# Patient Record
Sex: Male | Born: 1935 | Race: White | Hispanic: No | Marital: Married | Smoking: Never smoker
Health system: Southern US, Community
[De-identification: ages and names within clinical notes are randomized; demographics above are authoritative.]

## PROBLEM LIST (undated history)

## (undated) DIAGNOSIS — G8929 Other chronic pain: Secondary | ICD-10-CM

## (undated) DIAGNOSIS — E039 Hypothyroidism, unspecified: Secondary | ICD-10-CM

## (undated) DIAGNOSIS — R0600 Dyspnea, unspecified: Secondary | ICD-10-CM

## (undated) DIAGNOSIS — E785 Hyperlipidemia, unspecified: Secondary | ICD-10-CM

## (undated) DIAGNOSIS — R296 Repeated falls: Secondary | ICD-10-CM

## (undated) DIAGNOSIS — M545 Low back pain, unspecified: Secondary | ICD-10-CM

## (undated) DIAGNOSIS — N182 Chronic kidney disease, stage 2 (mild): Secondary | ICD-10-CM

## (undated) DIAGNOSIS — N400 Enlarged prostate without lower urinary tract symptoms: Secondary | ICD-10-CM

## (undated) DIAGNOSIS — I739 Peripheral vascular disease, unspecified: Secondary | ICD-10-CM

## (undated) DIAGNOSIS — M79674 Pain in right toe(s): Secondary | ICD-10-CM

## (undated) DIAGNOSIS — I10 Essential (primary) hypertension: Secondary | ICD-10-CM

## (undated) DIAGNOSIS — I779 Disorder of arteries and arterioles, unspecified: Secondary | ICD-10-CM

## (undated) DIAGNOSIS — I480 Paroxysmal atrial fibrillation: Secondary | ICD-10-CM

## (undated) DIAGNOSIS — C4492 Squamous cell carcinoma of skin, unspecified: Secondary | ICD-10-CM

## (undated) DIAGNOSIS — I350 Nonrheumatic aortic (valve) stenosis: Secondary | ICD-10-CM

## (undated) DIAGNOSIS — I5032 Chronic diastolic (congestive) heart failure: Secondary | ICD-10-CM

## (undated) DIAGNOSIS — G5790 Unspecified mononeuropathy of unspecified lower limb: Secondary | ICD-10-CM

## (undated) DIAGNOSIS — G629 Polyneuropathy, unspecified: Secondary | ICD-10-CM

## (undated) DIAGNOSIS — W19XXXA Unspecified fall, initial encounter: Secondary | ICD-10-CM

## (undated) HISTORY — PX: LAMINECTOMY AND MICRODISCECTOMY LUMBAR SPINE: SHX1913

## (undated) HISTORY — DX: Benign prostatic hyperplasia without lower urinary tract symptoms: N40.0

## (undated) HISTORY — DX: Hyperlipidemia, unspecified: E78.5

## (undated) HISTORY — PX: JOINT REPLACEMENT: SHX530

## (undated) HISTORY — DX: Squamous cell carcinoma of skin, unspecified: C44.92

## (undated) HISTORY — DX: Repeated falls: R29.6

## (undated) HISTORY — DX: Low back pain, unspecified: M54.50

## (undated) HISTORY — DX: Nonrheumatic aortic (valve) stenosis: I35.0

## (undated) HISTORY — DX: Unspecified fall, initial encounter: W19.XXXA

## (undated) HISTORY — PX: CAROTID ENDARTERECTOMY: SUR193

## (undated) HISTORY — DX: Peripheral vascular disease, unspecified: I73.9

## (undated) HISTORY — DX: Disorder of arteries and arterioles, unspecified: I77.9

## (undated) HISTORY — DX: Other chronic pain: G89.29

## (undated) HISTORY — DX: Paroxysmal atrial fibrillation: I48.0

## (undated) HISTORY — DX: Unspecified mononeuropathy of unspecified lower limb: G57.90

## (undated) HISTORY — DX: Low back pain: M54.5

## (undated) HISTORY — DX: Chronic diastolic (congestive) heart failure: I50.32

## (undated) HISTORY — DX: Pain in right toe(s): M79.674

## (undated) HISTORY — DX: Essential (primary) hypertension: I10

## (undated) HISTORY — DX: Chronic kidney disease, stage 2 (mild): N18.2

## (undated) HISTORY — DX: Hypothyroidism, unspecified: E03.9

## (undated) HISTORY — PX: LYMPH NODE DISSECTION: SHX5087

---

## 2002-12-27 ENCOUNTER — Encounter: Payer: Self-pay | Admitting: Neurosurgery

## 2002-12-29 ENCOUNTER — Ambulatory Visit (HOSPITAL_COMMUNITY): Admission: RE | Admit: 2002-12-29 | Discharge: 2002-12-30 | Payer: Self-pay | Admitting: Neurosurgery

## 2002-12-29 ENCOUNTER — Encounter: Payer: Self-pay | Admitting: Neurosurgery

## 2003-11-03 ENCOUNTER — Other Ambulatory Visit: Payer: Self-pay

## 2004-03-26 ENCOUNTER — Ambulatory Visit: Payer: Self-pay

## 2004-03-26 ENCOUNTER — Other Ambulatory Visit: Payer: Self-pay

## 2004-04-01 ENCOUNTER — Ambulatory Visit: Payer: Self-pay | Admitting: Specialist

## 2004-05-01 ENCOUNTER — Emergency Department: Payer: Self-pay | Admitting: Emergency Medicine

## 2004-06-03 ENCOUNTER — Ambulatory Visit: Payer: Self-pay | Admitting: Ophthalmology

## 2004-06-18 ENCOUNTER — Ambulatory Visit: Payer: Self-pay | Admitting: Ophthalmology

## 2004-09-16 ENCOUNTER — Emergency Department: Payer: Self-pay | Admitting: Emergency Medicine

## 2005-03-08 ENCOUNTER — Emergency Department (HOSPITAL_COMMUNITY): Admission: EM | Admit: 2005-03-08 | Discharge: 2005-03-08 | Payer: Self-pay | Admitting: Emergency Medicine

## 2005-04-02 ENCOUNTER — Ambulatory Visit (HOSPITAL_COMMUNITY): Admission: RE | Admit: 2005-04-02 | Discharge: 2005-04-02 | Payer: Self-pay | Admitting: *Deleted

## 2005-04-08 ENCOUNTER — Ambulatory Visit (HOSPITAL_COMMUNITY): Admission: RE | Admit: 2005-04-08 | Discharge: 2005-04-08 | Payer: Self-pay | Admitting: Cardiovascular Disease

## 2005-04-08 HISTORY — PX: CARDIAC CATHETERIZATION: SHX172

## 2007-02-03 ENCOUNTER — Ambulatory Visit: Payer: Self-pay | Admitting: Unknown Physician Specialty

## 2007-02-09 ENCOUNTER — Inpatient Hospital Stay: Payer: Self-pay | Admitting: Unknown Physician Specialty

## 2007-02-09 HISTORY — PX: KNEE SURGERY: SHX244

## 2010-01-17 ENCOUNTER — Ambulatory Visit: Payer: Self-pay | Admitting: Neurosurgery

## 2010-08-14 ENCOUNTER — Ambulatory Visit: Payer: Self-pay | Admitting: General Surgery

## 2010-08-20 ENCOUNTER — Ambulatory Visit: Payer: Self-pay | Admitting: Oncology

## 2010-08-22 ENCOUNTER — Ambulatory Visit: Payer: Self-pay | Admitting: General Surgery

## 2010-08-30 ENCOUNTER — Ambulatory Visit: Payer: Self-pay | Admitting: General Surgery

## 2010-09-12 ENCOUNTER — Ambulatory Visit: Payer: Self-pay | Admitting: Oncology

## 2010-09-20 ENCOUNTER — Ambulatory Visit: Payer: Self-pay | Admitting: Oncology

## 2010-10-20 ENCOUNTER — Ambulatory Visit: Payer: Self-pay | Admitting: Oncology

## 2010-11-20 ENCOUNTER — Ambulatory Visit: Payer: Self-pay | Admitting: Oncology

## 2010-12-13 ENCOUNTER — Inpatient Hospital Stay: Payer: Self-pay | Admitting: Internal Medicine

## 2010-12-13 DIAGNOSIS — I4891 Unspecified atrial fibrillation: Secondary | ICD-10-CM

## 2010-12-21 ENCOUNTER — Ambulatory Visit: Payer: Self-pay | Admitting: Oncology

## 2010-12-26 ENCOUNTER — Encounter: Payer: Self-pay | Admitting: *Deleted

## 2010-12-27 ENCOUNTER — Encounter: Payer: Self-pay | Admitting: Cardiovascular Disease

## 2010-12-27 ENCOUNTER — Encounter: Payer: Self-pay | Admitting: *Deleted

## 2010-12-27 ENCOUNTER — Ambulatory Visit (INDEPENDENT_AMBULATORY_CARE_PROVIDER_SITE_OTHER): Payer: Medicare Other | Admitting: Cardiovascular Disease

## 2010-12-27 DIAGNOSIS — I519 Heart disease, unspecified: Secondary | ICD-10-CM

## 2010-12-27 DIAGNOSIS — C4490 Unspecified malignant neoplasm of skin, unspecified: Secondary | ICD-10-CM

## 2010-12-27 DIAGNOSIS — I10 Essential (primary) hypertension: Secondary | ICD-10-CM

## 2010-12-27 DIAGNOSIS — I5189 Other ill-defined heart diseases: Secondary | ICD-10-CM

## 2010-12-27 DIAGNOSIS — I4891 Unspecified atrial fibrillation: Secondary | ICD-10-CM

## 2010-12-27 MED ORDER — DILTIAZEM HCL ER COATED BEADS 240 MG PO CP24: 240.0000 mg | ORAL_CAPSULE | Freq: Every day | ORAL | Status: DC

## 2010-12-27 MED ORDER — LORAZEPAM 0.5 MG PO TABS: 0.5000 mg | ORAL_TABLET | Freq: Three times a day (TID) | ORAL | Status: AC

## 2010-12-27 NOTE — Patient Instructions (Addendum)
You are doing well. Please increase the diltiazem to 240 mg daily, increase the metoprolol to 25 mg BID Please call us if you have new issues that need to be addressed before your next appt.  We will call you for a follow up Appt. In 3 months

## 2010-12-28 DIAGNOSIS — C4490 Unspecified malignant neoplasm of skin, unspecified: Secondary | ICD-10-CM | POA: Insufficient documentation

## 2010-12-28 NOTE — Assessment & Plan Note (Signed)
He is maintaining normal sinus rhythm. His blood pressure is poorly controlled and likely the cause of his paroxysmal atrial fibrillation, as well as diastolic dysfunction. We will double his diltiazem to 240 mg daily, and increased the metoprolol tartrate  To 25 mg b.i.d.

## 2010-12-28 NOTE — Progress Notes (Signed)
Patient ID: Jason Wade, male    DOB: October 18, 1923, 77 y.o.   MRN: [id number]  HPI Comments: Jason Wade is a 77 year old gentleman with a history of squamous cell cancer, resection, recurrence with long course of XRT, now undergoing chemotherapy who presented to [facility] from the cancer center with shortness of breath, found to be in atrial fibrillation. He presents to establish care in the office.  At the cancer center, he was noted to have a heart rate of 140 beats per minute. He received Cardizem IV and converted to normal sinus rhythm. He reported having shortness of breath over 3 days prior to admission. Symptoms started the day of or day following chemotherapy. He reports having IV fluids with chemotherapy.  Since his discharge, he has felt well though has noticed his blood pressure is very elevated. He does have some shortness of breath. He also has underlying anxiety about his ongoing treatment.  Echocardiogram done in the hospital shows normal LV systolic function, diastolic dysfunction, mild LVH, normal right ventricular systolic pressures  EKG shows normal sinus rhythm with rate 81 beats per minute with ST and T wave abnormality in V4 through V6, leads aVL, T wave abnormality and aVF  Outpatient Encounter Prescriptions as of 12/27/2010  Medication Sig Dispense Refill  . acetaminophen (TYLENOL) 325 MG tablet Take 650 mg by mouth as needed.        Marland Kitchen aspirin 81 MG tablet Take 81 mg by mouth daily.        . cloNIDine (CATAPRES) 0.2 MG tablet Take 0.2 mg by mouth 2 (two) times daily.        Marland Kitchen levothyroxine (SYNTHROID, LEVOTHROID) 75 MCG tablet Take 75 mcg by mouth daily.        . methylPREDNIsolone (MEDROL DOSPACK) 4 MG tablet As directed.      . metoprolol tartrate (LOPRESSOR) 25 MG tablet Take 1/2 tablet by mouth BID       . prochlorperazine (COMPAZINE) 10 MG tablet Take 10 mg by mouth every 6 (six) hours as needed.        . simvastatin (ZOCOR) 20 MG tablet Take 20 mg by mouth at bedtime.         Marland Kitchen terazosin (HYTRIN) 2 MG capsule Take 2 mg by mouth at bedtime.        Marland Kitchen  diltiazem (CARDIZEM CD) 120 MG 24 hr capsule Take 1 tablet by mouth Daily.          Review of Systems  Constitutional: Negative.   HENT: Negative.   Eyes: Negative.   Respiratory: Positive for shortness of breath.   Cardiovascular: Negative.   Gastrointestinal: Negative.   Musculoskeletal: Negative.   Skin: Negative.   Neurological: Negative.   Hematological: Negative.   Psychiatric/Behavioral: The patient is nervous/anxious.   All other systems reviewed and are negative.    BP 172/70  Pulse 81  Ht 5\' 8"  (1.727 m)  Wt 184 lb (83.462 kg)  BMI 27.98 kg/m2   Physical Exam  Nursing note and vitals reviewed. Constitutional: He is oriented to person, place, and time. He appears well-developed and well-nourished.       Appears moderately anxious. Mild-to-moderate obesity.   HENT:  Head: Normocephalic.  Nose: Nose normal.  Mouth/Throat: Oropharynx is clear and moist.  Eyes: Conjunctivae are normal. Pupils are equal, round, and reactive to light.  Neck: Normal range of motion. Neck supple. No JVD present.  Cardiovascular: Normal rate, regular rhythm, S1 normal, S2 normal, normal heart sounds and  intact distal pulses.  Exam reveals no gallop and no friction rub.   No murmur heard. Pulmonary/Chest: Effort normal and breath sounds normal. No respiratory distress. He has no wheezes. He has no rales. He exhibits no tenderness.  Abdominal: Soft. Bowel sounds are normal. He exhibits no distension. There is no tenderness.  Musculoskeletal: Normal range of motion. He exhibits no edema and no tenderness.  Lymphadenopathy:    He has no cervical adenopathy.  Neurological: He is alert and oriented to person, place, and time. Coordination normal.  Skin: Skin is warm and dry. No rash noted. No erythema.  Psychiatric: He has a normal mood and affect. His behavior is normal. Judgment and thought content normal.            Assessment and Plan

## 2010-12-28 NOTE — Assessment & Plan Note (Signed)
Diastolic dysfunction noted on echocardiogram with normal LV systolic function appeared diastolic dysfunction likely contributing to atrial fibrillation. We will need to use IV fluids cautiously when giving chemotherapy to minimize paroxysmal atrial fibrillation  episodes.

## 2010-12-28 NOTE — Assessment & Plan Note (Signed)
Blood pressure is poorly controlled today. We have suggested he increase his metoprolol to 25 mg b.i.d. And diltiazem to 240 mg daily

## 2010-12-30 ENCOUNTER — Encounter: Payer: Self-pay | Admitting: Cardiovascular Disease

## 2011-01-20 ENCOUNTER — Ambulatory Visit: Payer: Self-pay | Admitting: Oncology

## 2011-02-05 ENCOUNTER — Ambulatory Visit: Payer: Self-pay | Admitting: Oncology

## 2011-02-10 ENCOUNTER — Ambulatory Visit: Payer: Self-pay | Admitting: Oncology

## 2011-02-13 ENCOUNTER — Telehealth: Payer: Self-pay | Admitting: Cardiovascular Disease

## 2011-02-13 NOTE — Telephone Encounter (Signed)
Pt calling concerning medication that the dr changed when he was in the hospital. Pt wants to go back on the meds he stopped. Please call

## 2011-02-14 NOTE — Telephone Encounter (Signed)
Attempted to call pt, no vm x1.

## 2011-02-20 ENCOUNTER — Ambulatory Visit: Payer: Self-pay | Admitting: Oncology

## 2011-03-24 ENCOUNTER — Ambulatory Visit: Payer: Self-pay | Admitting: Oncology

## 2011-03-28 ENCOUNTER — Encounter: Payer: Self-pay | Admitting: Cardiovascular Disease

## 2011-03-28 ENCOUNTER — Ambulatory Visit (INDEPENDENT_AMBULATORY_CARE_PROVIDER_SITE_OTHER): Payer: Medicare Other | Admitting: Cardiovascular Disease

## 2011-03-28 VITALS — BP 165/73 | HR 52 | Ht 68.0 in | Wt 186.0 lb

## 2011-03-28 DIAGNOSIS — I4891 Unspecified atrial fibrillation: Secondary | ICD-10-CM

## 2011-03-28 DIAGNOSIS — I519 Heart disease, unspecified: Secondary | ICD-10-CM

## 2011-03-28 DIAGNOSIS — I5189 Other ill-defined heart diseases: Secondary | ICD-10-CM

## 2011-03-28 DIAGNOSIS — I10 Essential (primary) hypertension: Secondary | ICD-10-CM

## 2011-03-28 NOTE — Progress Notes (Signed)
Patient ID: Jason Wade, male    DOB: 04-30-1923, 77 y.o.   MRN: [id number]  HPI Comments: Mr. Cisse is a 77 year old gentleman with a history of squamous cell cancer, resection, recurrence with long course of XRT, s/p chemotherapy who presented to [facility] from the cancer center with shortness of breath, found to be in atrial fibrillation. He presents 4 routine followup.  He denies any palpitations concerning for recurrence of his atrial fibrillation. He reports his blood pressure at home is typically in the 140-145 range systolic. He has no complaints though would like to stop some of his medications. He does report minimal edema in his right lower ankle. No chest pain, shortness of breath. On his last clinic visit, diltiazem CD was increased from 120 mg to 240 mg daily for hypertension.  Echocardiogram done in the hospital shows normal LV systolic function, diastolic dysfunction, mild LVH, normal right ventricular systolic pressures  EKG shows normal sinus rhythm with rate 53 beats per minute with ST and T wave abnormality in V4 through V6, leads aVL, T wave abnormality and aVF  Outpatient Encounter Prescriptions as of 03/28/2011  Medication Sig Dispense Refill  . acetaminophen (TYLENOL) 325 MG tablet Take 650 mg by mouth as needed.        Marland Kitchen aspirin 81 MG tablet Take 81 mg by mouth daily.        . cloNIDine (CATAPRES) 0.2 MG tablet Take 0.2 mg by mouth 2 (two) times daily.        Marland Kitchen diltiazem (CARDIZEM CD) 240 MG 24 hr capsule Take 1 capsule (240 mg total) by mouth daily.  60 capsule  6  . levothyroxine (SYNTHROID, LEVOTHROID) 75 MCG tablet Take 75 mcg by mouth daily.        . methylPREDNIsolone (MEDROL DOSPACK) 4 MG tablet As directed.      . metoprolol tartrate (LOPRESSOR) 25 MG tablet Take 1 tablet by mouth Daily.        . prochlorperazine (COMPAZINE) 10 MG tablet Take 10 mg by mouth every 6 (six) hours as needed.        . simvastatin (ZOCOR) 20 MG tablet Take 20 mg by mouth at bedtime.         Marland Kitchen terazosin (HYTRIN) 2 MG capsule Take 2 mg by mouth at bedtime.            Review of Systems  Constitutional: Negative.   HENT: Negative.   Eyes: Negative.   Cardiovascular: Negative.   Gastrointestinal: Negative.   Musculoskeletal: Negative.   Skin: Negative.   Neurological: Negative.   Hematological: Negative.   Psychiatric/Behavioral: The patient is nervous/anxious.   All other systems reviewed and are negative.    BP 165/73  Pulse 52  Ht 5\' 8"  (1.727 m)  Wt 186 lb (84.369 kg)  BMI 28.28 kg/m2 Repeat blood pressure was 180/90. He was somewhat agitated when discussing his blood pressure.  Physical Exam  Nursing note and vitals reviewed. Constitutional: He is oriented to person, place, and time. He appears well-developed and well-nourished.       Appears moderately anxious. Mild-to-moderate obesity.   HENT:  Head: Normocephalic.  Nose: Nose normal.  Mouth/Throat: Oropharynx is clear and moist.  Eyes: Conjunctivae are normal. Pupils are equal, round, and reactive to light.  Neck: Normal range of motion. Neck supple. No JVD present.  Cardiovascular: Normal rate, regular rhythm, S1 normal, S2 normal, normal heart sounds and intact distal pulses.  Exam reveals no gallop and no friction  rub.   No murmur heard. Pulmonary/Chest: Effort normal and breath sounds normal. No respiratory distress. He has no wheezes. He has no rales. He exhibits no tenderness.  Abdominal: Soft. Bowel sounds are normal. He exhibits no distension. There is no tenderness.  Musculoskeletal: Normal range of motion. He exhibits no edema and no tenderness.  Lymphadenopathy:    He has no cervical adenopathy.  Neurological: He is alert and oriented to person, place, and time. Coordination normal.  Skin: Skin is warm and dry. No rash noted. No erythema.  Psychiatric: He has a normal mood and affect. His behavior is normal. Judgment and thought content normal.           Assessment and Plan

## 2011-03-28 NOTE — Patient Instructions (Signed)
You are doing well. No medication changes were made.  Please call us if you have new issues that need to be addressed before your next appt.  The office will contact you for a follow up Appt. In 6 months  

## 2011-03-28 NOTE — Assessment & Plan Note (Signed)
No further recurrence as far as we know of his atrial fibrillation on calcium channel blocker and beta blocker. I suggested he stay on his current medication regimen

## 2011-03-28 NOTE — Assessment & Plan Note (Signed)
He has indicated he would like to come off some of his medications. I suggested he stay on his current regimen given that his blood pressure has been mildly elevated at home and was certainly elevated in our office. I think he was somewhat agitated at not being able to come off some of the medication and this may have caused his blood pressure to go up even higher. I've asked him to monitor his blood pressure and heart rate at home and copies of our office for further evaluation. If his blood pressure does look significantly improved at home, perhaps we could slowly decrease the doses of the medications.

## 2011-03-28 NOTE — Assessment & Plan Note (Signed)
No clinical signs of heart failure on today's visit. We'll continue current medications

## 2011-04-22 ENCOUNTER — Ambulatory Visit: Payer: Self-pay | Admitting: Oncology

## 2011-05-01 ENCOUNTER — Ambulatory Visit: Payer: Self-pay | Admitting: Oncology

## 2011-05-06 ENCOUNTER — Ambulatory Visit: Payer: Self-pay | Admitting: Oncology

## 2011-05-06 LAB — CBC CANCER CENTER
Basophil %: 2.1 %
Eosinophil %: 1 %
HGB: 12.6 g/dL — ABNORMAL LOW (ref 13.0–18.0)
Lymphocyte %: 10.3 %
MCH: 30.4 pg (ref 26.0–34.0)
Monocyte #: 0.7 x10 3/mm (ref 0.0–0.7)
Neutrophil %: 76.9 %
Platelet: 200 x10 3/mm (ref 150–440)
RBC: 4.13 10*6/uL — ABNORMAL LOW (ref 4.40–5.90)
RDW: 16.5 % — ABNORMAL HIGH (ref 11.5–14.5)
WBC: 7 x10 3/mm (ref 3.8–10.6)

## 2011-05-07 ENCOUNTER — Telehealth: Payer: Self-pay

## 2011-05-07 DIAGNOSIS — I10 Essential (primary) hypertension: Secondary | ICD-10-CM

## 2011-05-07 NOTE — Telephone Encounter (Signed)
Would like a refill sent to [facility] @ [phone] ID #  [id number] for metoprolol tart 25 mg take one tablet bid also diltiazem cd 240 mg take one tablet daily for 90 day supply with 3 refills. Spoke with Synetta Fail (pharmacist) at [facility] for the refill for above medications.

## 2011-05-14 ENCOUNTER — Encounter: Payer: Self-pay | Admitting: Oncology

## 2011-05-23 ENCOUNTER — Encounter: Payer: Self-pay | Admitting: Oncology

## 2011-05-23 ENCOUNTER — Ambulatory Visit: Payer: Self-pay | Admitting: Oncology

## 2011-06-17 ENCOUNTER — Ambulatory Visit: Payer: Self-pay | Admitting: Oncology

## 2011-06-20 ENCOUNTER — Ambulatory Visit: Payer: Self-pay | Admitting: Oncology

## 2011-06-20 ENCOUNTER — Encounter: Payer: Self-pay | Admitting: Oncology

## 2011-07-21 ENCOUNTER — Encounter: Payer: Self-pay | Admitting: Oncology

## 2011-07-21 ENCOUNTER — Ambulatory Visit: Payer: Self-pay | Admitting: Oncology

## 2011-08-20 ENCOUNTER — Ambulatory Visit: Payer: Self-pay | Admitting: Oncology

## 2011-09-05 LAB — CREATININE, SERUM: Creatinine: 0.97 mg/dL (ref 0.60–1.30)

## 2011-09-20 ENCOUNTER — Ambulatory Visit: Payer: Self-pay | Admitting: Oncology

## 2011-09-29 ENCOUNTER — Encounter: Payer: Self-pay | Admitting: Cardiovascular Disease

## 2011-09-29 ENCOUNTER — Ambulatory Visit (INDEPENDENT_AMBULATORY_CARE_PROVIDER_SITE_OTHER): Payer: Medicare Other | Admitting: Cardiovascular Disease

## 2011-09-29 VITALS — BP 140/60 | HR 68 | Ht 68.0 in | Wt 187.0 lb

## 2011-09-29 DIAGNOSIS — I4891 Unspecified atrial fibrillation: Secondary | ICD-10-CM

## 2011-09-29 DIAGNOSIS — I10 Essential (primary) hypertension: Secondary | ICD-10-CM

## 2011-09-29 DIAGNOSIS — R609 Edema, unspecified: Secondary | ICD-10-CM

## 2011-09-29 MED ORDER — HYDROCHLOROTHIAZIDE 25 MG PO TABS: 25.0000 mg | ORAL_TABLET | Freq: Every day | ORAL | Status: DC

## 2011-09-29 NOTE — Assessment & Plan Note (Addendum)
He does have significant edema on today's visit. We will start HCTZ 25 mg daily. If there's no improvement in the next several weeks, we will likely need to decrease his diltiazem as this could be a side effect of the calcium channel blocker.

## 2011-09-29 NOTE — Progress Notes (Signed)
Patient ID: Jason Wade, male    DOB: [birth year], 78 y.o.   MRN: [id number]  HPI Comments: Jason Wade is a 78 year old gentleman with a history of squamous cell cancer, resection, recurrence with long course of XRT, s/p chemotherapy who presented to [facility] from the cancer center with shortness of breath, found to be in atrial fibrillation. He presents 4 routine followup.  He reports that he has had worsening lower extremity edema bilaterally. He would like to restart HCTZ. He does have lymphedema in his right arm and has compression therapy on the arm by regular basis. Previous surgery with lymph node resection on the right. He does report occasional episodes of palpitations lasting for several seconds at a time. He states having a previous carotid ultrasound at [facility].  echocardiogram done in the hospital shows normal LV systolic function, diastolic dysfunction, mild LVH, normal right ventricular systolic pressures  EKG shows normal sinus rhythm with rate 59 beats per minute with ST and T wave abnormality in V4 through V6, leads aVL, T wave abnormality and aVF  Outpatient Encounter Prescriptions as of 09/29/2011  Medication Sig Dispense Refill  . aspirin 81 MG tablet Take 81 mg by mouth daily.        . cloNIDine (CATAPRES) 0.2 MG tablet Take 0.2 mg by mouth 2 (two) times daily.        Marland Kitchen diltiazem (CARDIZEM CD) 240 MG 24 hr capsule Take 1 capsule (240 mg total) by mouth daily.  60 capsule  6  . levothyroxine (SYNTHROID, LEVOTHROID) 75 MCG tablet Take 75 mcg by mouth daily.        . metoprolol tartrate (LOPRESSOR) 25 MG tablet Take 1 tablet by mouth 2 (two) times daily.       . simvastatin (ZOCOR) 20 MG tablet Take 20 mg by mouth at bedtime.        Marland Kitchen terazosin (HYTRIN) 2 MG capsule Take 2 mg by mouth at bedtime.         Review of Systems  Constitutional: Negative.   HENT: Negative.   Eyes: Negative.   Respiratory: Negative.   Cardiovascular: Positive for leg  swelling.  Gastrointestinal: Negative.   Musculoskeletal: Negative.   Skin: Negative.   Neurological: Negative.   Hematological: Negative.   All other systems reviewed and are negative.    BP 140/60  Pulse 68  Ht 5\' 8"  (1.727 m)  Wt 187 lb (84.823 kg)  BMI 28.43 kg/m2  Physical Exam  Nursing note and vitals reviewed. Constitutional: He is oriented to person, place, and time. He appears well-developed and well-nourished.       Appears moderately anxious. Mild-to-moderate obesity.   HENT:  Head: Normocephalic.  Nose: Nose normal.  Mouth/Throat: Oropharynx is clear and moist.  Eyes: Conjunctivae are normal. Pupils are equal, round, and reactive to light.  Neck: Normal range of motion. Neck supple. No JVD present.  Cardiovascular: Normal rate, regular rhythm, S1 normal, S2 normal, normal heart sounds and intact distal pulses.  Exam reveals no gallop and no friction rub.   No murmur heard.      1-2+ edema bilaterally to below the knees  Pulmonary/Chest: Effort normal and breath sounds normal. No respiratory distress. He has no wheezes. He has no rales. He exhibits no tenderness.  Abdominal: Soft. Bowel sounds are normal. He exhibits no distension. There is no tenderness.  Musculoskeletal: Normal range of motion. He exhibits edema. He exhibits no tenderness.  Lymphadenopathy:    He  has no cervical adenopathy.  Neurological: He is alert and oriented to person, place, and time. Coordination normal.  Skin: Skin is warm and dry. No rash noted. No erythema.  Psychiatric: He has a normal mood and affect. His behavior is normal. Judgment and thought content normal.           Assessment and Plan

## 2011-09-29 NOTE — Assessment & Plan Note (Signed)
Blood pressure borderline elevated. We will add HCTZ daily

## 2011-09-29 NOTE — Patient Instructions (Signed)
You are doing well. Try the HCTZ for edema, one a day If there is no improvement with the edema in a few weeks, call the office (could be the diltiazem side effects)  Watch your fluid intake   Please call us if you have new issues that need to be addressed before your next appt.  Your physician wants you to follow-up in: 6 months.  You will receive a reminder letter in the mail two months in advance. If you don't receive a letter, please call our office to schedule the follow-up appointment.

## 2011-09-29 NOTE — Assessment & Plan Note (Signed)
Maintaining sinus rhythm on today's visit.Jason Wade

## 2011-09-30 ENCOUNTER — Encounter: Payer: Self-pay | Admitting: Cardiovascular Disease

## 2011-10-20 ENCOUNTER — Ambulatory Visit: Payer: Self-pay | Admitting: Oncology

## 2011-11-20 ENCOUNTER — Ambulatory Visit: Payer: Self-pay | Admitting: Oncology

## 2011-12-21 ENCOUNTER — Ambulatory Visit: Payer: Self-pay | Admitting: Oncology

## 2012-01-23 ENCOUNTER — Ambulatory Visit: Payer: Self-pay | Admitting: Oncology

## 2012-02-20 ENCOUNTER — Ambulatory Visit: Payer: Self-pay | Admitting: Oncology

## 2012-03-02 ENCOUNTER — Ambulatory Visit: Payer: Self-pay | Admitting: Oncology

## 2012-03-02 LAB — CREATININE, SERUM
Creatinine: 1.2 mg/dL (ref 0.60–1.30)
EGFR (African American): >60
EGFR (Non-African Amer.): 55 — ABNORMAL LOW

## 2012-03-15 ENCOUNTER — Emergency Department: Payer: Self-pay | Admitting: Emergency Medicine

## 2012-03-21 ENCOUNTER — Ambulatory Visit: Payer: Self-pay | Admitting: Oncology

## 2012-03-29 ENCOUNTER — Ambulatory Visit (INDEPENDENT_AMBULATORY_CARE_PROVIDER_SITE_OTHER): Payer: Medicare Other | Admitting: Cardiovascular Disease

## 2012-03-29 ENCOUNTER — Encounter: Payer: Self-pay | Admitting: Cardiovascular Disease

## 2012-03-29 VITALS — BP 140/58 | HR 57 | Ht 68.0 in | Wt 173.0 lb

## 2012-03-29 DIAGNOSIS — I10 Essential (primary) hypertension: Secondary | ICD-10-CM

## 2012-03-29 DIAGNOSIS — R609 Edema, unspecified: Secondary | ICD-10-CM

## 2012-03-29 DIAGNOSIS — I4891 Unspecified atrial fibrillation: Secondary | ICD-10-CM

## 2012-03-29 MED ORDER — LORAZEPAM 0.5 MG PO TABS: 0.5000 mg | ORAL_TABLET | Freq: Three times a day (TID) | ORAL | Status: DC | PRN

## 2012-03-29 NOTE — Patient Instructions (Addendum)
You are doing well. No medication changes were made.  Please call us if you have new issues that need to be addressed before your next appt.  Your physician wants you to follow-up in: 6 months.  You will receive a reminder letter in the mail two months in advance. If you don't receive a letter, please call our office to schedule the follow-up appointment.   

## 2012-03-29 NOTE — Assessment & Plan Note (Signed)
Edema has resolved with low-dose HCTZ. We have suggested he continue one half pill daily. He can hold the pill for any dizziness.

## 2012-03-29 NOTE — Progress Notes (Signed)
Patient ID: Jason Wade, male    DOB: August 03, 1923, 78 y.o.   MRN: [id number]  HPI Comments: Jason Wade is a 78 year old gentleman with a history of squamous cell cancer, resection, recurrence with long course of XRT, s/p chemotherapy who presented to [facility] from the cancer center with shortness of breath, found to be in atrial fibrillation. He presents for routine followup.  On his last clinic visit, he has had worsening lower extremity edema bilaterally. He was restarted on HCTZ with significant improvement of his edema. He did start to have occasional dizziness and he cut the dose to one half pill daily. Edema has essentially resolved.  He does have lymphedema in his right arm and has compression therapy on the arm by regular basis. Previous surgery with lymph node resection on the right.  He does have some back pain  echocardiogram done in the hospital shows normal LV systolic function, diastolic dysfunction, mild LVH, normal right ventricular systolic pressures  EKG shows normal sinus rhythm with rate 59 beats per minute with ST and T wave abnormality in V4 through V6, II, III, AVF  Outpatient Encounter Prescriptions as of 03/29/2012  Medication Sig Dispense Refill  . aspirin 81 MG tablet Take 81 mg by mouth daily.       . cloNIDine (CATAPRES) 0.2 MG tablet Take 0.2 mg by mouth 2 (two) times daily.        Marland Kitchen diltiazem (CARDIZEM CD) 240 MG 24 hr capsule Take 1 capsule (240 mg total) by mouth daily.  60 capsule  6  . hydrochlorothiazide (HYDRODIURIL) 25 MG tablet Take 1 tablet (25 mg total) by mouth daily.  60 tablet  6  . levothyroxine (SYNTHROID, LEVOTHROID) 75 MCG tablet Take 75 mcg by mouth daily.        Marland Kitchen LORazepam (ATIVAN) 0.5 MG tablet Take 1 tablet (0.5 mg total) by mouth every 8 (eight) hours as needed.  30 tablet  1  . metoprolol tartrate (LOPRESSOR) 25 MG tablet Take 1 tablet by mouth 2 (two) times daily.       . simvastatin (ZOCOR) 20 MG tablet Take 20 mg by mouth at bedtime.         Marland Kitchen terazosin (HYTRIN) 2 MG capsule Take 2 mg by mouth at bedtime.          Review of Systems  Constitutional: Negative.   HENT: Negative.   Eyes: Negative.   Respiratory: Negative.   Gastrointestinal: Negative.   Musculoskeletal: Negative.   Skin: Negative.   Neurological: Negative.   Hematological: Negative.   All other systems reviewed and are negative.    BP 140/58  Pulse 57  Ht 5\' 8"  (1.727 m)  Wt 173 lb (78.472 kg)  BMI 26.30 kg/m2  Physical Exam  Nursing note and vitals reviewed. Constitutional: He is oriented to person, place, and time. He appears well-developed and well-nourished.       Appears moderately anxious. Mild-to-moderate obesity.   HENT:  Head: Normocephalic.  Nose: Nose normal.  Mouth/Throat: Oropharynx is clear and moist.  Eyes: Conjunctivae normal are normal. Pupils are equal, round, and reactive to light.  Neck: Normal range of motion. Neck supple. No JVD present.  Cardiovascular: Normal rate, regular rhythm, S1 normal, S2 normal and intact distal pulses.  Exam reveals no gallop and no friction rub.   Murmur heard.  Crescendo systolic murmur is present with a grade of 2/6  Pulmonary/Chest: Effort normal and breath sounds normal. No respiratory distress. He has no wheezes.  He has no rales. He exhibits no tenderness.  Abdominal: Soft. Bowel sounds are normal. He exhibits no distension. There is no tenderness.  Musculoskeletal: Normal range of motion. He exhibits no edema and no tenderness.  Lymphadenopathy:    He has no cervical adenopathy.  Neurological: He is alert and oriented to person, place, and time. Coordination normal.  Skin: Skin is warm and dry. No rash noted. No erythema.  Psychiatric: He has a normal mood and affect. His behavior is normal. Judgment and thought content normal.           Assessment and Plan

## 2012-03-29 NOTE — Assessment & Plan Note (Signed)
Maintaining normal sinus rhythm. No changes to his medications. 

## 2012-03-29 NOTE — Assessment & Plan Note (Signed)
Blood pressure is well controlled on today's visit. No changes made to the medications. 

## 2012-04-01 ENCOUNTER — Emergency Department: Payer: Self-pay | Admitting: Emergency Medicine

## 2012-04-04 ENCOUNTER — Other Ambulatory Visit: Payer: Self-pay | Admitting: Cardiovascular Disease

## 2012-04-05 ENCOUNTER — Other Ambulatory Visit: Payer: Self-pay | Admitting: *Deleted

## 2012-04-05 DIAGNOSIS — I10 Essential (primary) hypertension: Secondary | ICD-10-CM

## 2012-04-05 MED ORDER — METOPROLOL TARTRATE 25 MG PO TABS: 25.0000 mg | ORAL_TABLET | Freq: Two times a day (BID) | ORAL | Status: DC

## 2012-04-05 MED ORDER — DILTIAZEM HCL ER COATED BEADS 240 MG PO CP24: 240.0000 mg | ORAL_CAPSULE | Freq: Every day | ORAL | Status: DC

## 2012-04-05 NOTE — Telephone Encounter (Signed)
Refilled Diltiazem and Metoprolol.

## 2012-04-21 ENCOUNTER — Ambulatory Visit: Payer: Self-pay | Admitting: Oncology

## 2012-05-22 ENCOUNTER — Ambulatory Visit: Payer: Self-pay | Admitting: Oncology

## 2012-06-19 ENCOUNTER — Ambulatory Visit: Payer: Self-pay | Admitting: Oncology

## 2012-07-20 ENCOUNTER — Ambulatory Visit: Payer: Self-pay | Admitting: Oncology

## 2012-08-19 ENCOUNTER — Ambulatory Visit: Payer: Self-pay | Admitting: Oncology

## 2012-08-24 ENCOUNTER — Ambulatory Visit: Payer: Self-pay | Admitting: Oncology

## 2012-08-24 LAB — CBC CANCER CENTER
Basophil %: 0.5 %
Eosinophil %: 1.7 %
HGB: 12.8 g/dL — ABNORMAL LOW (ref 13.0–18.0)
Lymphocyte %: 15.5 %
MCHC: 34 g/dL (ref 32.0–36.0)
Monocyte #: 0.9 x10 3/mm (ref 0.2–1.0)
Monocyte %: 11.6 %
Neutrophil #: 5.4 x10 3/mm (ref 1.4–6.5)
Neutrophil %: 70.7 %
Platelet: 190 x10 3/mm (ref 150–440)
RDW: 13.9 % (ref 11.5–14.5)
WBC: 7.7 x10 3/mm (ref 3.8–10.6)

## 2012-08-24 LAB — BASIC METABOLIC PANEL
Anion Gap: 10 (ref 7–16)
BUN: 25 mg/dL — ABNORMAL HIGH (ref 7–18)
Co2: 25 mmol/L (ref 21–32)
Creatinine: 1.14 mg/dL (ref 0.60–1.30)
EGFR (African American): >60
Glucose: 108 mg/dL — ABNORMAL HIGH (ref 65–99)
Osmolality: 282 (ref 275–301)
Potassium: 3.7 mmol/L (ref 3.5–5.1)
Sodium: 139 mmol/L (ref 136–145)

## 2012-09-19 ENCOUNTER — Ambulatory Visit: Payer: Self-pay | Admitting: Oncology

## 2012-09-27 ENCOUNTER — Ambulatory Visit (INDEPENDENT_AMBULATORY_CARE_PROVIDER_SITE_OTHER): Payer: Medicare Other | Admitting: Cardiovascular Disease

## 2012-09-27 ENCOUNTER — Encounter: Payer: Self-pay | Admitting: Cardiovascular Disease

## 2012-09-27 VITALS — BP 150/64 | HR 52 | Ht 68.4 in | Wt 184.5 lb

## 2012-09-27 DIAGNOSIS — R609 Edema, unspecified: Secondary | ICD-10-CM

## 2012-09-27 DIAGNOSIS — I10 Essential (primary) hypertension: Secondary | ICD-10-CM

## 2012-09-27 DIAGNOSIS — R001 Bradycardia, unspecified: Secondary | ICD-10-CM

## 2012-09-27 DIAGNOSIS — I4891 Unspecified atrial fibrillation: Secondary | ICD-10-CM

## 2012-09-27 DIAGNOSIS — I498 Other specified cardiac arrhythmias: Secondary | ICD-10-CM

## 2012-09-27 NOTE — Progress Notes (Signed)
Patient ID: Jason Wade, male    DOB: [birth year], 79 y.o.   MRN: [id number]  HPI Comments: Jason Wade is a 79 year old gentleman with a history of squamous cell cancer, resection, recurrence with long course of XRT, s/p chemotherapy who presented to [facility] from the cancer center with shortness of breath, found to be in atrial fibrillation. He presents for routine followup.  On previous visits, he has had worsening lower extremity edema bilaterally. He was restarted on HCTZ with significant improvement of his edema. He did start to have occasional dizziness and he cut the dose to one half pill daily. Edema has essentially resolved.  He does have lymphedema in his right arm and has compression therapy on the arm by regular basis. Previous surgery with lymph node resection on the right.   On today's visit, he reports having a fall in November 2013 as he walked at [employer]. He cut his head and needed several stitches. Since then his balance has been adequate though he uses a cane. He has a pinched nerve in his leg, chronic neuropathy. He reports his blood pressure is well controlled at home.   echocardiogram done in the hospital shows normal LV systolic function, diastolic dysfunction, mild LVH, normal right ventricular systolic pressures  EKG shows normal sinus rhythm with rate 52 beats per minute with ST and T wave abnormality in V4 through V6, II, III, AVF  Outpatient Encounter Prescriptions as of 09/27/2012  Medication Sig Dispense Refill  . aspirin 81 MG tablet Take 81 mg by mouth daily.       . cloNIDine (CATAPRES) 0.2 MG tablet Take 0.2 mg by mouth 2 (two) times daily.        Marland Kitchen diltiazem (CARDIZEM CD) 240 MG 24 hr capsule Take 1 capsule (240 mg total) by mouth daily.  60 capsule  5  . hydrochlorothiazide (HYDRODIURIL) 25 MG tablet Take 1 tablet (25 mg total) by mouth daily.  60 tablet  6  . levothyroxine (SYNTHROID, LEVOTHROID) 75 MCG tablet Take 75 mcg by mouth daily.        . metoprolol  tartrate (LOPRESSOR) 25 MG tablet Take 1 tablet (25 mg total) by mouth 2 (two) times daily.  60 tablet  5  . simvastatin (ZOCOR) 20 MG tablet Take 20 mg by mouth at bedtime.        Marland Kitchen terazosin (HYTRIN) 2 MG capsule Take 2 mg by mouth at bedtime.           Review of Systems  Constitutional: Negative.   HENT: Negative.   Eyes: Negative.   Respiratory: Negative.   Cardiovascular: Negative.   Gastrointestinal: Negative.   Musculoskeletal: Negative.   Skin: Negative.   Neurological: Negative.   Psychiatric/Behavioral: Negative.   All other systems reviewed and are negative.    BP 150/64  Pulse 52  Ht 5' 8.4" (1.737 m)  Wt 184 lb 8 oz (83.689 kg)  BMI 27.74 kg/m2  Physical Exam  Nursing note and vitals reviewed. Constitutional: He is oriented to person, place, and time. He appears well-developed and well-nourished.  Appears moderately anxious. Mild-to-moderate obesity.   HENT:  Head: Normocephalic.  Nose: Nose normal.  Mouth/Throat: Oropharynx is clear and moist.  Eyes: Conjunctivae are normal. Pupils are equal, round, and reactive to light.  Neck: Normal range of motion. Neck supple. No JVD present.  Cardiovascular: Normal rate, regular rhythm, S1 normal, S2 normal and intact distal pulses.  Exam reveals no gallop and no friction rub.  Murmur heard.  Crescendo systolic murmur is present with a grade of 2/6  Pulmonary/Chest: Effort normal and breath sounds normal. No respiratory distress. He has no wheezes. He has no rales. He exhibits no tenderness.  Abdominal: Soft. Bowel sounds are normal. He exhibits no distension. There is no tenderness.  Musculoskeletal: Normal range of motion. He exhibits no edema and no tenderness.  Lymphadenopathy:    He has no cervical adenopathy.  Neurological: He is alert and oriented to person, place, and time. Coordination normal.  Skin: Skin is warm and dry. No rash noted. No erythema.  Psychiatric: He has a normal mood and affect. His  behavior is normal. Judgment and thought content normal.      Assessment and Plan

## 2012-09-27 NOTE — Assessment & Plan Note (Signed)
Heart rate is slow. He does have some gait instability. We have suggested he cut his metoprolol in half and take 12.5 mg twice a day

## 2012-09-27 NOTE — Patient Instructions (Addendum)
You are doing well. Please cut the metoprolol in 1/2 twice a day Monitor your heart rate for one week If it continue to run low, Take only 1/2 pill of metoprolol in the AM only  Please call us if you have new issues that need to be addressed before your next appt.  Your physician wants you to follow-up in: 6 months.  You will receive a reminder letter in the mail two months in advance. If you don't receive a letter, please call our office to schedule the follow-up appointment.

## 2012-09-27 NOTE — Assessment & Plan Note (Signed)
Blood pressure is well controlled on today's visit. No changes made to the medications. 

## 2012-09-27 NOTE — Assessment & Plan Note (Signed)
Maintaining normal sinus rhythm 

## 2012-09-27 NOTE — Assessment & Plan Note (Signed)
No significant edema on today's visit 

## 2012-10-19 ENCOUNTER — Ambulatory Visit: Payer: Self-pay | Admitting: Oncology

## 2012-10-30 ENCOUNTER — Other Ambulatory Visit: Payer: Self-pay | Admitting: Cardiovascular Disease

## 2012-11-19 ENCOUNTER — Ambulatory Visit: Payer: Self-pay | Admitting: Oncology

## 2012-12-24 ENCOUNTER — Ambulatory Visit: Payer: Self-pay | Admitting: Oncology

## 2013-01-03 ENCOUNTER — Other Ambulatory Visit: Payer: Self-pay | Admitting: Cardiovascular Disease

## 2013-01-04 ENCOUNTER — Other Ambulatory Visit: Payer: Self-pay | Admitting: *Deleted

## 2013-01-04 MED ORDER — HYDROCHLOROTHIAZIDE 25 MG PO TABS: 25.0000 mg | ORAL_TABLET | Freq: Every day | ORAL | Status: DC

## 2013-01-04 NOTE — Telephone Encounter (Signed)
Refilled Hydrochlorothiazide sent to [facility] pharmacy.

## 2013-01-19 ENCOUNTER — Ambulatory Visit: Payer: Self-pay | Admitting: Oncology

## 2013-02-19 ENCOUNTER — Ambulatory Visit: Payer: Self-pay | Admitting: Oncology

## 2013-03-02 LAB — CBC CANCER CENTER
Basophil #: 0 x10 3/mm (ref 0.0–0.1)
Eosinophil #: 0.1 x10 3/mm (ref 0.0–0.7)
MCH: 31.6 pg (ref 26.0–34.0)
MCHC: 33.3 g/dL (ref 32.0–36.0)
Monocyte #: 0.9 x10 3/mm (ref 0.2–1.0)
Monocyte %: 11.3 %
Neutrophil %: 67.7 %
Platelet: 216 x10 3/mm (ref 150–440)
RDW: 13.2 % (ref 11.5–14.5)

## 2013-03-02 LAB — BASIC METABOLIC PANEL
Anion Gap: 7 (ref 7–16)
BUN: 20 mg/dL — ABNORMAL HIGH (ref 7–18)
Calcium, Total: 8.6 mg/dL (ref 8.5–10.1)
EGFR (African American): >60
Osmolality: 280 (ref 275–301)
Sodium: 139 mmol/L (ref 136–145)

## 2013-03-06 ENCOUNTER — Other Ambulatory Visit: Payer: Self-pay | Admitting: Cardiovascular Disease

## 2013-03-07 ENCOUNTER — Other Ambulatory Visit: Payer: Self-pay | Admitting: *Deleted

## 2013-03-07 DIAGNOSIS — I10 Essential (primary) hypertension: Secondary | ICD-10-CM

## 2013-03-07 MED ORDER — DILTIAZEM HCL ER COATED BEADS 240 MG PO CP24: 240.0000 mg | ORAL_CAPSULE | Freq: Every day | ORAL | Status: DC

## 2013-03-07 NOTE — Telephone Encounter (Signed)
Requested Prescriptions   Signed Prescriptions Disp Refills  . diltiazem (CARDIZEM CD) 240 MG 24 hr capsule 60 capsule 5    Sig: Take 1 capsule (240 mg total) by mouth daily.    Authorizing Provider: Antonieta Iba    Ordering User: Kendrick Fries

## 2013-03-21 ENCOUNTER — Ambulatory Visit: Payer: Self-pay | Admitting: Oncology

## 2013-03-28 ENCOUNTER — Encounter: Payer: Self-pay | Admitting: Cardiovascular Disease

## 2013-03-28 ENCOUNTER — Ambulatory Visit (INDEPENDENT_AMBULATORY_CARE_PROVIDER_SITE_OTHER): Payer: Medicare Other | Admitting: Cardiovascular Disease

## 2013-03-28 ENCOUNTER — Other Ambulatory Visit: Payer: Self-pay

## 2013-03-28 VITALS — BP 150/72 | HR 61 | Ht 68.0 in | Wt 180.5 lb

## 2013-03-28 DIAGNOSIS — I10 Essential (primary) hypertension: Secondary | ICD-10-CM

## 2013-03-28 DIAGNOSIS — R609 Edema, unspecified: Secondary | ICD-10-CM

## 2013-03-28 DIAGNOSIS — I4891 Unspecified atrial fibrillation: Secondary | ICD-10-CM

## 2013-03-28 DIAGNOSIS — R0989 Other specified symptoms and signs involving the circulatory and respiratory systems: Secondary | ICD-10-CM

## 2013-03-28 MED ORDER — HYDROCHLOROTHIAZIDE 25 MG PO TABS: 25.0000 mg | ORAL_TABLET | Freq: Every day | ORAL | Status: DC

## 2013-03-28 NOTE — Assessment & Plan Note (Signed)
Edema significantly improved on HCTZ daily

## 2013-03-28 NOTE — Assessment & Plan Note (Signed)
Notable carotid bruit on the right. Suggested carotid ultrasound. He will schedule this for early 2015

## 2013-03-28 NOTE — Patient Instructions (Signed)
You are doing well. No medication changes were made.  Please call the office when you would like to do a carotid ultrasound (Please talk with Dr. Vear Clock)  Please call us if you have new issues that need to be addressed before your next appt.  Your physician wants you to follow-up in: 6 months.  You will receive a reminder letter in the mail two months in advance. If you don't receive a letter, please call our office to schedule the follow-up appointment.

## 2013-03-28 NOTE — Telephone Encounter (Signed)
Pt wants this called to drug store 90 day, not mail order. Please call pt to let him know when it s called in.

## 2013-03-28 NOTE — Progress Notes (Signed)
Patient ID: Jason Wade, male    DOB: 1924/03/13, 79 y.o.   MRN: [id number]  HPI Comments: Jason Wade is a 79 year old gentleman with a history of squamous cell cancer, resection, recurrence with long course of XRT, s/p chemotherapy who presented to [facility] from the cancer center with shortness of breath, found to be in atrial fibrillation. He presents for routine followup.  In the past, he stopped the metoprolol. Shortly after developed arrhythmia concerning for atrial fibrillation. He restarted metoprolol reports his arrhythmia resolved. This happened 2 or 3 times. Again he stopped the metoprolol on a regular basis secondary to bradycardia. Taking the Toprol as needed reports his leg edema has significantly improved on HCTZ. Blood pressure is also well controlled at home by his report, 120s to 140 systolic  He does have lymphedema in his right arm and has compression therapy on the arm by regular basis. Previous surgery with lymph node resection on the right.    he had a  fall in November 2013 as he walked at [employer]. He cut his head and needed several stitches. Since then his balance has been adequate though he uses a cane. He has a pinched nerve in his leg, chronic neuropathy.   echocardiogram done in the hospital shows normal LV systolic function, diastolic dysfunction, mild LVH, normal right ventricular systolic pressures  EKG shows normal sinus rhythm with rate 61 beats per minute with ST and T wave abnormality in V3 through V6, II, III, AVF  Outpatient Encounter Prescriptions as of 03/28/2013  Medication Sig  . aspirin 81 MG tablet Take 81 mg by mouth daily.   . cloNIDine (CATAPRES) 0.2 MG tablet Take 0.2 mg by mouth 2 (two) times daily.    Marland Kitchen diltiazem (CARDIZEM CD) 240 MG 24 hr capsule Take 1 capsule (240 mg total) by mouth daily.  . hydrochlorothiazide (HYDRODIURIL) 25 MG tablet Take 1 tablet (25 mg total) by mouth daily.  Marland Kitchen levothyroxine (SYNTHROID, LEVOTHROID) 75 MCG tablet Take 75  mcg by mouth daily.    . simvastatin (ZOCOR) 20 MG tablet Take 20 mg by mouth at bedtime.    Marland Kitchen terazosin (HYTRIN) 2 MG capsule Take 2 mg by mouth at bedtime.    . [DISCONTINUED] hydrochlorothiazide (HYDRODIURIL) 25 MG tablet Take 1 tablet (25 mg total) by mouth daily.  . [DISCONTINUED] diltiazem (CARDIZEM CD) 240 MG 24 hr capsule TAKE 1 CAPSULE DAILY  . [DISCONTINUED] metoprolol tartrate (LOPRESSOR) 25 MG tablet TAKE 1 TABLET TWICE A DAY     Review of Systems  Constitutional: Negative.   HENT: Negative.   Eyes: Negative.   Respiratory: Negative.   Cardiovascular: Negative.   Gastrointestinal: Negative.   Endocrine: Negative.   Musculoskeletal: Negative.   Skin: Negative.   Allergic/Immunologic: Negative.   Neurological: Negative.   Hematological: Negative.   Psychiatric/Behavioral: Negative.   All other systems reviewed and are negative.    BP 150/72  Pulse 61  Ht 5\' 8"  (1.727 m)  Wt 180 lb 8 oz (81.874 kg)  BMI 27.45 kg/m2  Physical Exam  Nursing note and vitals reviewed. Constitutional: He is oriented to person, place, and time. He appears well-developed and well-nourished.  HENT:  Head: Normocephalic.  Nose: Nose normal.  Mouth/Throat: Oropharynx is clear and moist.  Eyes: Conjunctivae are normal. Pupils are equal, round, and reactive to light.  Neck: Normal range of motion. Neck supple. No JVD present. Carotid bruit is present.  Cardiovascular: Normal rate, regular rhythm, S1 normal, S2 normal and intact  distal pulses.  Exam reveals no gallop and no friction rub.   Murmur heard.  Crescendo systolic murmur is present with a grade of 2/6  Pulmonary/Chest: Effort normal and breath sounds normal. No respiratory distress. He has no wheezes. He has no rales. He exhibits no tenderness.  Abdominal: Soft. Bowel sounds are normal. He exhibits no distension. There is no tenderness.  Musculoskeletal: Normal range of motion. He exhibits no edema and no tenderness.   Lymphadenopathy:    He has no cervical adenopathy.  Neurological: He is alert and oriented to person, place, and time. Coordination normal.  Skin: Skin is warm and dry. No rash noted. No erythema.  Psychiatric: He has a normal mood and affect. His behavior is normal. Judgment and thought content normal.      Assessment and Plan

## 2013-03-28 NOTE — Telephone Encounter (Signed)
Requested Prescriptions   Signed Prescriptions Disp Refills  . hydrochlorothiazide (HYDRODIURIL) 25 MG tablet 90 tablet 3    Sig: Take 1 tablet (25 mg total) by mouth daily.    Authorizing Provider: GOLLAN, TIMOTHY J    Ordering User: LOPEZ, MARINA C    

## 2013-03-28 NOTE — Assessment & Plan Note (Signed)
Is unable to tolerate metoprolol and diltiazem secondary to bradycardia. Suggested he take metoprolol as needed for any arrhythmia. Also suggested if he starts to have more frequent episodes, that he call our office. He might need anticoagulation

## 2013-03-28 NOTE — Assessment & Plan Note (Signed)
By his numbers at home, blood pressure well controlled. Suggested he continue to monitor blood pressure

## 2013-04-18 ENCOUNTER — Ambulatory Visit: Payer: Self-pay | Admitting: General Surgery

## 2013-04-20 ENCOUNTER — Encounter: Payer: Self-pay | Admitting: General Surgery

## 2013-04-20 ENCOUNTER — Ambulatory Visit (INDEPENDENT_AMBULATORY_CARE_PROVIDER_SITE_OTHER): Payer: Medicare Other | Admitting: General Surgery

## 2013-04-20 VITALS — BP 160/76 | HR 72 | Resp 14 | Ht 68.0 in | Wt 176.0 lb

## 2013-04-20 DIAGNOSIS — C44621 Squamous cell carcinoma of skin of unspecified upper limb, including shoulder: Secondary | ICD-10-CM

## 2013-04-20 DIAGNOSIS — C44622 Squamous cell carcinoma of skin of right upper limb, including shoulder: Secondary | ICD-10-CM

## 2013-04-20 NOTE — Progress Notes (Signed)
Patient ID: Jason Wade, male   DOB: 01/20/1924, 79 y.o.   MRN: [id number]  Chief Complaint  Patient presents with  . Routine Post Op    port removal    HPI Jason Wade is a 79 y.o. male here today for a power port removal.  HPI  Past Medical History  Diagnosis Date  . HTN (hypertension)   . Hypothyroidism   . Chronic low back pain   . Kidney insufficiency   . Cancer of axilla     right  . HLD (hyperlipidemia)   . BPH (benign prostatic hyperplasia)   . Squamous cell carcinoma of skin     x3  . Gout     Past Surgical History  Procedure Laterality Date  . Cardiac catheterization  04-08-2005  . Laminectomy and microdiscectomy lumbar spine       Bilateral  at L4-5 followed by L4-5 right microdiskectomy.  . Knee surgery  02-09-2007    replacement    Family History  Problem Relation Age of Onset  . Lung cancer    . Hypertension    . Hypertension Mother     Social History History  Substance Use Topics  . Smoking status: Never Smoker   . Smokeless tobacco: Never Used  . Alcohol Use: No    No Known Allergies  Current Outpatient Prescriptions  Medication Sig Dispense Refill  . aspirin 81 MG tablet Take 81 mg by mouth daily.       . cloNIDine (CATAPRES) 0.2 MG tablet Take 0.2 mg by mouth 2 (two) times daily.        Marland Kitchen diltiazem (CARDIZEM CD) 240 MG 24 hr capsule Take 1 capsule (240 mg total) by mouth daily.  60 capsule  5  . hydrochlorothiazide (HYDRODIURIL) 25 MG tablet Take 1 tablet (25 mg total) by mouth daily.  90 tablet  3  . levothyroxine (SYNTHROID, LEVOTHROID) 75 MCG tablet Take 75 mcg by mouth daily.        . simvastatin (ZOCOR) 20 MG tablet Take 20 mg by mouth at bedtime.        Marland Kitchen terazosin (HYTRIN) 2 MG capsule Take 2 mg by mouth at bedtime.         No current facility-administered medications for this visit.    Review of Systems Review of Systems  Constitutional: Negative.   Respiratory: Negative.   Cardiovascular: Negative.     Blood  pressure 160/76, pulse 72, resp. rate 14, height 5\' 8"  (1.727 m), weight 176 lb (79.833 kg).  Physical Exam Physical Exam The patient is making use of a right upper extremity sleeve to control lymphedema.  The left chest shows no evidence of inflammation or infection at the site of the power port.   Data Reviewed Authorization from the treating oncologist has been received.  Assessment    Completion of adjuvant therapy for squamous cell carcinoma.     Plan    The chest was clipped of excessive hair in 10 cc of 0.5% Xylocaine with 0.25% Marcaine with 1:200,000 units of epinephrine was utilized. This was well tolerated. ChloraPrep was applied to the skin. The previous incision was opened and the port exposed. Fixation sutures were removed and the port removed in its entirety. The wound was closed in layers with 3-0 Vicryl running suture to the adipose layer and a running 3-0 Vicryl subcutaneous to suture for the skin. Benzoin and Steri-Strips followed by Telfa Tegaderm dressing was applied. Ice pack was provided. Instructions in regards  to wound care were discussed. The patient will return in one week for nursing evaluation.        Jason Wade 04/22/2013, 3:39 PM

## 2013-04-22 DIAGNOSIS — C44622 Squamous cell carcinoma of skin of right upper limb, including shoulder: Secondary | ICD-10-CM | POA: Insufficient documentation

## 2013-04-27 ENCOUNTER — Ambulatory Visit (INDEPENDENT_AMBULATORY_CARE_PROVIDER_SITE_OTHER): Payer: Medicare Other | Admitting: *Deleted

## 2013-04-27 DIAGNOSIS — C44622 Squamous cell carcinoma of skin of right upper limb, including shoulder: Secondary | ICD-10-CM

## 2013-04-27 DIAGNOSIS — C44621 Squamous cell carcinoma of skin of unspecified upper limb, including shoulder: Secondary | ICD-10-CM

## 2013-04-27 NOTE — Progress Notes (Signed)
Patient came in today for a port check.  The port site is clean, with no signs of infection noted. Follow up as scheduled.

## 2013-04-27 NOTE — Patient Instructions (Signed)
Patient to return as scheduled.  

## 2013-05-03 ENCOUNTER — Encounter (INDEPENDENT_AMBULATORY_CARE_PROVIDER_SITE_OTHER): Payer: Medicare Other

## 2013-05-03 DIAGNOSIS — R0989 Other specified symptoms and signs involving the circulatory and respiratory systems: Secondary | ICD-10-CM

## 2013-05-03 DIAGNOSIS — I6529 Occlusion and stenosis of unspecified carotid artery: Secondary | ICD-10-CM

## 2013-05-09 ENCOUNTER — Telehealth: Payer: Self-pay

## 2013-05-09 DIAGNOSIS — I6529 Occlusion and stenosis of unspecified carotid artery: Secondary | ICD-10-CM

## 2013-05-09 NOTE — Telephone Encounter (Signed)
Pt called stating that he is ready to schedule appt w/ vascular surgeon. Pt sched to see Dr. Lucky Cowboy at [facility] tomorrow, 05/10/13 @ 1:45. Pt is aware of time, address and to arrive 15 mins early.

## 2013-05-13 ENCOUNTER — Ambulatory Visit: Payer: Self-pay | Admitting: Vascular Surgery

## 2013-05-20 ENCOUNTER — Telehealth: Payer: Self-pay

## 2013-05-20 NOTE — Telephone Encounter (Signed)
Faxed surgical clearance to [facility] that pt is cleared for Rt carotid endarterectomy to [phone], date of procedure is pending.

## 2013-05-25 ENCOUNTER — Ambulatory Visit: Payer: Self-pay | Admitting: Vascular Surgery

## 2013-05-25 LAB — CBC
HCT: 40.8 % (ref 40.0–52.0)
HGB: 13.7 g/dL (ref 13.0–18.0)
MCH: 31.8 pg (ref 26.0–34.0)
MCHC: 33.5 g/dL (ref 32.0–36.0)
MCV: 95 fL (ref 80–100)
PLATELETS: 204 10*3/uL (ref 150–440)
RBC: 4.3 10*6/uL — AB (ref 4.40–5.90)
RDW: 13.5 % (ref 11.5–14.5)
WBC: 9 10*3/uL (ref 3.8–10.6)

## 2013-05-25 LAB — BASIC METABOLIC PANEL
Anion Gap: 3 — ABNORMAL LOW (ref 7–16)
BUN: 21 mg/dL — ABNORMAL HIGH (ref 7–18)
CALCIUM: 9.2 mg/dL (ref 8.5–10.1)
CREATININE: 1.07 mg/dL (ref 0.60–1.30)
Chloride: 102 mmol/L (ref 98–107)
Co2: 29 mmol/L (ref 21–32)
EGFR (African American): >60
Glucose: 91 mg/dL (ref 65–99)
OSMOLALITY: 271 (ref 275–301)
Potassium: 4 mmol/L (ref 3.5–5.1)
Sodium: 134 mmol/L — ABNORMAL LOW (ref 136–145)

## 2013-05-25 LAB — URINALYSIS, COMPLETE
BACTERIA: NONE SEEN
Bilirubin,UR: NEGATIVE
Blood: NEGATIVE
Glucose,UR: NEGATIVE mg/dL (ref 0–75)
Ketone: NEGATIVE
Leukocyte Esterase: NEGATIVE
Nitrite: NEGATIVE
Ph: 5 (ref 4.5–8.0)
Protein: 30
RBC,UR: NONE SEEN /HPF (ref 0–5)
Specific Gravity: 1.005 (ref 1.003–1.030)
Squamous Epithelial: NONE SEEN
WBC UR: NONE SEEN /HPF (ref 0–5)

## 2013-06-01 ENCOUNTER — Inpatient Hospital Stay: Payer: Self-pay | Admitting: Vascular Surgery

## 2013-06-02 LAB — CBC WITH DIFFERENTIAL/PLATELET
BASOS ABS: 0.1 10*3/uL (ref 0.0–0.1)
Basophil %: 0.9 %
Eosinophil #: 0.1 10*3/uL (ref 0.0–0.7)
Eosinophil %: 0.9 %
HCT: 36.4 % — ABNORMAL LOW (ref 40.0–52.0)
HGB: 12.3 g/dL — AB (ref 13.0–18.0)
Lymphocyte #: 1.1 10*3/uL (ref 1.0–3.6)
Lymphocyte %: 11.8 %
MCH: 31.8 pg (ref 26.0–34.0)
MCHC: 33.8 g/dL (ref 32.0–36.0)
MCV: 94 fL (ref 80–100)
MONO ABS: 1.1 x10 3/mm — AB (ref 0.2–1.0)
Monocyte %: 12.3 %
Neutrophil #: 6.6 10*3/uL — ABNORMAL HIGH (ref 1.4–6.5)
Neutrophil %: 74.1 %
Platelet: 180 10*3/uL (ref 150–440)
RBC: 3.87 10*6/uL — AB (ref 4.40–5.90)
RDW: 12.9 % (ref 11.5–14.5)
WBC: 8.9 10*3/uL (ref 3.8–10.6)

## 2013-06-02 LAB — BASIC METABOLIC PANEL
Anion Gap: 8 (ref 7–16)
BUN: 17 mg/dL (ref 7–18)
CHLORIDE: 103 mmol/L (ref 98–107)
CO2: 25 mmol/L (ref 21–32)
Calcium, Total: 8.4 mg/dL — ABNORMAL LOW (ref 8.5–10.1)
Creatinine: 1 mg/dL (ref 0.60–1.30)
Glucose: 104 mg/dL — ABNORMAL HIGH (ref 65–99)
Osmolality: 274 (ref 275–301)
Potassium: 3.5 mmol/L (ref 3.5–5.1)
Sodium: 136 mmol/L (ref 136–145)

## 2013-06-02 LAB — PROTIME-INR
INR: 1.1
PROTHROMBIN TIME: 14.5 s (ref 11.5–14.7)

## 2013-06-02 LAB — APTT: Activated PTT: 29.7 secs (ref 23.6–35.9)

## 2013-06-03 LAB — PATHOLOGY REPORT

## 2013-08-16 LAB — LIPID PANEL
Cholesterol: 123 mg/dL (ref 0–200)
HDL: 15 mg/dL — AB (ref 35–70)
LDL Cholesterol: 72 mg/dL
Triglycerides: 73 mg/dL (ref 40–160)

## 2013-08-16 LAB — TSH: TSH: 3.35 u[IU]/mL (ref 0.41–5.90)

## 2013-08-16 LAB — BASIC METABOLIC PANEL
BUN: 20 mg/dL (ref 4–21)
CREATININE: 1 mg/dL (ref 0.6–1.3)
GLUCOSE: 112 mg/dL
POTASSIUM: 4.4 mmol/L (ref 3.4–5.3)
Sodium: 139 mmol/L (ref 137–147)

## 2013-08-16 LAB — HEPATIC FUNCTION PANEL
ALT: 9 U/L — AB (ref 10–40)
AST: 23 U/L (ref 14–40)
BILIRUBIN DIRECT: 0.1 mg/dL (ref 0.01–0.4)
BILIRUBIN, TOTAL: 0.4 mg/dL

## 2013-09-01 ENCOUNTER — Ambulatory Visit: Payer: Self-pay | Admitting: Oncology

## 2013-09-02 ENCOUNTER — Telehealth: Payer: Self-pay

## 2013-09-02 ENCOUNTER — Encounter: Payer: Self-pay | Admitting: Nurse Practitioner

## 2013-09-02 ENCOUNTER — Ambulatory Visit (INDEPENDENT_AMBULATORY_CARE_PROVIDER_SITE_OTHER): Payer: Medicare Other | Admitting: Nurse Practitioner

## 2013-09-02 VITALS — BP 180/80 | HR 64 | Ht 67.0 in | Wt 180.5 lb

## 2013-09-02 DIAGNOSIS — I10 Essential (primary) hypertension: Secondary | ICD-10-CM

## 2013-09-02 DIAGNOSIS — I480 Paroxysmal atrial fibrillation: Secondary | ICD-10-CM

## 2013-09-02 DIAGNOSIS — I4891 Unspecified atrial fibrillation: Secondary | ICD-10-CM

## 2013-09-02 DIAGNOSIS — I6529 Occlusion and stenosis of unspecified carotid artery: Secondary | ICD-10-CM

## 2013-09-02 NOTE — Patient Instructions (Signed)
Your physician recommends that you continue on your current medications as directed. Please refer to the Current Medication list given to you today.  Your physician recommends that you schedule a follow-up appointment in: 2-3 months with Dr. Rockey Situ

## 2013-09-02 NOTE — Telephone Encounter (Signed)
Pt called and states that he feels like his heart is "slowing down" this a.m. It was 51, 64, last night was 46 and 48. Please call.

## 2013-09-02 NOTE — Progress Notes (Signed)
Patient Name: Jason Wade Date of Encounter: 09/02/2013  Primary Care Provider:  Ronald Lobo, MD Primary Cardiologist:  Johnny Bridge, MD   Patient Profile  80 y/o male with h/o PAF who presents today due to concerns re: bradycardia.  Problem List   Past Medical History  Diagnosis Date  . HTN (hypertension)   . Hypothyroidism   . Chronic low back pain   . CKD (chronic kidney disease)   . HLD (hyperlipidemia)   . BPH (benign prostatic hyperplasia)   . Squamous cell carcinoma of skin     a. x3 s/p local excision followed by more extensive surgery due to lymphadenopathy on the right.  . Gout   . PAF (paroxysmal atrial fibrillation)     a. 11/2010 Echo: EF >55%, diast dysfxn, mild conc lvh.  . Carotid arterial disease     a. 2015: s/p R CEA.   Past Surgical History  Procedure Laterality Date  . Cardiac catheterization  04-08-2005  . Laminectomy and microdiscectomy lumbar spine       Bilateral  at L4-5 followed by L4-5 right microdiskectomy.  . Knee surgery  02-09-2007    replacement    Allergies  No Known Allergies  HPI  80 y/o male with a h/o PAF dating back to 2012.  He has never been on long term anticoagulation and uses dilt cd 240mg  daily.  He previously took metoprolol as well but this was d/c'd in the setting of resting bradycardia.  He has not had any recent paroxysms of afib.  Yesterday, he noted that his resting HR was in the mid to high 50's and he was concerned that that was too low.  He was asymptomatic and went about his day as usual.  Last night he checked his HR before bed using a wrist BP cuff, and it was 47.  This AM, he decided not to take his diltiazem and called for an appt.  He denies chest pain, palpitations, dyspnea, pnd, orthopnea, n, v, dizziness, syncope, edema, weight gain, or early satiety. HR here is 64 and BP is elevated @ 180/80.  Home Medications  Prior to Admission medications   Medication Sig Start Date End Date Taking?  Authorizing Provider  aspirin 81 MG tablet Take 81 mg by mouth daily.    Yes Historical Provider, MD  cloNIDine (CATAPRES) 0.2 MG tablet Take 0.2 mg by mouth 2 (two) times daily.     Yes Historical Provider, MD  diltiazem (CARDIZEM CD) 240 MG 24 hr capsule Take 1 capsule (240 mg total) by mouth daily. 03/07/13  Yes Minna Merritts, MD  hydrochlorothiazide (HYDRODIURIL) 25 MG tablet Take 1 tablet (25 mg total) by mouth daily. 03/28/13 03/28/14 Yes Minna Merritts, MD  levothyroxine (SYNTHROID, LEVOTHROID) 75 MCG tablet Take 75 mcg by mouth daily.     Yes Historical Provider, MD  simvastatin (ZOCOR) 20 MG tablet Take 20 mg by mouth at bedtime.     Yes Historical Provider, MD  terazosin (HYTRIN) 2 MG capsule Take 2 mg by mouth at bedtime.     Yes Historical Provider, MD    Review of Systems  As above, he has been doing well overall.  He exercises every Wed.  He denies chest pain, palpitations, dyspnea, pnd, orthopnea, n, v, dizziness, syncope, edema, weight gain, or early satiety.  All other systems reviewed and are otherwise negative except as noted above.  Physical Exam  Blood pressure 180/80, pulse 64, height 5\' 7"  (1.702 m), weight  180 lb 8 oz (81.874 kg).  General: Pleasant, NAD Psych: Normal affect. Neuro: Alert and oriented X 3. Moves all extremities spontaneously. HEENT: Normal  Neck: Supple without JVD.  Bruits vs radiated murmur bilat. Lungs:  Resp regular and unlabored, CTA. Heart: RRR no s3, s4, 2/6 SEM RUSB radiating to neck bilat. Abdomen: Soft, non-tender, non-distended, BS + x 4.  Extremities: No clubbing, cyanosis or edema. DP/PT/Radials 2+ and equal bilaterally.  Accessory Clinical Findings  ECG - rsr, 64, lvh w/ repolarization abnormalities (st dep, twi V4-V6) -   Assessment & Plan  1.  PAF:  In sinus.  He has noted asymptomatic resting bradycardia in the mid to high 50's.  He has good exercise tolerance.  He did not take his dilt today.  I advised that so long as he  is asymptomatic, HR's in the 50's can be considered normal.  As he is concerned, I offered to reduce his dilt dose but he says that he is now reassured and would prefer to stay on 240mg  daily since it has helped to keep his BP controlled.  He will follow his HR over the weekend.  If he is frequently falling into the 40's or becomes symptomatic at any point, he will call us and we can drop his dilt dose to 180mg  daily.  He will likely need additional BP control if that is the case.  He is not on long term anticoagulation - asa only.  2.  HTN:  BP elevated in clinic but he did not take his dilt today. He will take it now.  He follows this closely @ home.  3.  HL:  On statin.  4. Systolic murmur:  Previously shown to have Ao sclerosis on echo in 2012.  5.  Dispo:  F/U 2-3 months or sooner as needed.    Rogelia Mire, NP 09/02/2013, 3:57 PM

## 2013-09-02 NOTE — Telephone Encounter (Signed)
Spoke w/ pt.  He reports that his HR has been running low: Wed:  68 after walking downstairs to exercise Yesterday at cancer ctr: 57 Last night: 46, 48, 51 This am:  69 after running into house this am. Reports that he has been feeling lightheaded recently and would like to be seen. Pt sched to see Ignacia Bayley, NP this afternoon at 3:15.

## 2013-09-02 NOTE — Telephone Encounter (Signed)
Left message for pt to call back  °

## 2013-09-05 ENCOUNTER — Other Ambulatory Visit: Payer: Self-pay | Admitting: *Deleted

## 2013-09-05 DIAGNOSIS — I10 Essential (primary) hypertension: Secondary | ICD-10-CM

## 2013-09-05 MED ORDER — DILTIAZEM HCL ER COATED BEADS 240 MG PO CP24: 240.0000 mg | ORAL_CAPSULE | Freq: Every day | ORAL | Status: DC

## 2013-09-05 NOTE — Telephone Encounter (Signed)
Requested Prescriptions   Signed Prescriptions Disp Refills  . diltiazem (CARDIZEM CD) 240 MG 24 hr capsule 180 capsule 3    Sig: Take 1 capsule (240 mg total) by mouth daily.    Authorizing Provider: Minna Merritts    Ordering User: Britt Bottom

## 2013-09-19 ENCOUNTER — Ambulatory Visit: Payer: Self-pay | Admitting: Oncology

## 2013-10-04 ENCOUNTER — Ambulatory Visit: Payer: Medicare Other | Admitting: Cardiovascular Disease

## 2013-11-04 ENCOUNTER — Ambulatory Visit (INDEPENDENT_AMBULATORY_CARE_PROVIDER_SITE_OTHER): Payer: Medicare Other | Admitting: Cardiovascular Disease

## 2013-11-04 ENCOUNTER — Encounter: Payer: Self-pay | Admitting: Cardiovascular Disease

## 2013-11-04 ENCOUNTER — Encounter (INDEPENDENT_AMBULATORY_CARE_PROVIDER_SITE_OTHER): Payer: Self-pay

## 2013-11-04 VITALS — BP 122/72 | HR 61 | Ht 68.0 in | Wt 178.5 lb

## 2013-11-04 DIAGNOSIS — I498 Other specified cardiac arrhythmias: Secondary | ICD-10-CM

## 2013-11-04 DIAGNOSIS — I6529 Occlusion and stenosis of unspecified carotid artery: Secondary | ICD-10-CM

## 2013-11-04 DIAGNOSIS — I4891 Unspecified atrial fibrillation: Secondary | ICD-10-CM

## 2013-11-04 DIAGNOSIS — R001 Bradycardia, unspecified: Secondary | ICD-10-CM

## 2013-11-04 DIAGNOSIS — I359 Nonrheumatic aortic valve disorder, unspecified: Secondary | ICD-10-CM

## 2013-11-04 DIAGNOSIS — I6521 Occlusion and stenosis of right carotid artery: Secondary | ICD-10-CM

## 2013-11-04 DIAGNOSIS — I350 Nonrheumatic aortic (valve) stenosis: Secondary | ICD-10-CM

## 2013-11-04 NOTE — Assessment & Plan Note (Signed)
Carotid endarterectomy February 2015

## 2013-11-04 NOTE — Assessment & Plan Note (Signed)
Likely has mild aortic valve stenosis. He had sclerosis in 2012 with no significant stenosis

## 2013-11-04 NOTE — Assessment & Plan Note (Signed)
Heart rate adequate today. We have suggested he monitor his heart rate at home. If this runs low in the future, dose of diltiazem could be decreased

## 2013-11-04 NOTE — Assessment & Plan Note (Signed)
Maintaining normal sinus rhythm. No changes to his medications. 

## 2013-11-04 NOTE — Patient Instructions (Signed)
You are doing well. No medication changes were made.  Please monitor your blood pressure and heart rate at home  Please call us if you have new issues that need to be addressed before your next appt.  Your physician wants you to follow-up in: 6 months.  You will receive a reminder letter in the mail two months in advance. If you don't receive a letter, please call our office to schedule the follow-up appointment.

## 2013-11-04 NOTE — Progress Notes (Signed)
Patient ID: Jason Wade, male    DOB: 15-Nov-1923, 80 y.o.   MRN: [id number]  HPI Comments: [name] is a 80 year old gentleman with a history of squamous cell cancer, resection, recurrence with long course of XRT, s/p chemotherapy, aortic valve stenosis,  who presented to [facility] from the cancer center with shortness of breath, found to be in atrial fibrillation. He presents for routine followup.  A followup today, he reports that he is doing well. There was some concern of low heart rate on his prior clinic visit. Heart rate typically runs in the mid to high 50s. Isn't feeling well with no symptoms of near syncope or lightheadedness. Otherwise very active, tolerating his medications well.  He had carotid endarterectomy on the right in February 2015 He states that he works out 2 times per week  History of lymphedema in his right arm and has compression therapy on the arm by regular basis. Previous surgery with lymph node resection on the right.   Previous  fall in November 2013 as he walked at [employer]. He cut his head and needed several stitches. Since then his balance has been adequate though he uses a cane. He has a pinched nerve in his leg, chronic neuropathy.   echocardiogram done in the hospital shows normal LV systolic function, diastolic dysfunction, mild LVH, normal right ventricular systolic pressures  EKG shows normal sinus rhythm with rate 61 beats per minute with ST and T wave abnormality in V3 through V6, II, III, AVF  Outpatient Encounter Prescriptions as of 11/04/2013  Medication Sig  . aspirin 81 MG tablet Take 81 mg by mouth daily.   . cloNIDine (CATAPRES) 0.2 MG tablet Take 0.2 mg by mouth 2 (two) times daily.    Marland Kitchen diltiazem (CARDIZEM CD) 240 MG 24 hr capsule Take 1 capsule (240 mg total) by mouth daily.  . hydrochlorothiazide (HYDRODIURIL) 25 MG tablet Take 1 tablet (25 mg total) by mouth daily.  Marland Kitchen levothyroxine (SYNTHROID, LEVOTHROID) 75 MCG tablet Take 75 mcg by mouth  daily.    . simvastatin (ZOCOR) 20 MG tablet Take 20 mg by mouth at bedtime.    Marland Kitchen terazosin (HYTRIN) 2 MG capsule Take 2 mg by mouth at bedtime.      Review of Systems  Constitutional: Negative.   HENT: Negative.   Eyes: Negative.   Respiratory: Negative.   Cardiovascular: Negative.   Gastrointestinal: Negative.   Endocrine: Negative.   Musculoskeletal: Positive for gait problem.  Skin: Negative.   Allergic/Immunologic: Negative.   Neurological: Negative.   Hematological: Negative.   Psychiatric/Behavioral: Negative.   All other systems reviewed and are negative.   BP 122/72  Pulse 61  Ht 5\' 8"  (1.727 m)  Wt 178 lb 8 oz (80.967 kg)  BMI 27.15 kg/m2  Physical Exam  Nursing note and vitals reviewed. Constitutional: He is oriented to person, place, and time. He appears well-developed and well-nourished.  HENT:  Head: Normocephalic.  Nose: Nose normal.  Mouth/Throat: Oropharynx is clear and moist.  Eyes: Conjunctivae are normal. Pupils are equal, round, and reactive to light.  Neck: Normal range of motion. Neck supple. No JVD present. Carotid bruit is present.  Cardiovascular: Normal rate, regular rhythm, S1 normal, S2 normal and intact distal pulses.  Exam reveals no gallop and no friction rub.   Murmur heard.  Crescendo systolic murmur is present with a grade of 2/6  Pulmonary/Chest: Effort normal and breath sounds normal. No respiratory distress. He has no wheezes. He has no  rales. He exhibits no tenderness.  Abdominal: Soft. Bowel sounds are normal. He exhibits no distension. There is no tenderness.  Musculoskeletal: Normal range of motion. He exhibits no edema and no tenderness.  Lymphadenopathy:    He has no cervical adenopathy.  Neurological: He is alert and oriented to person, place, and time. Coordination normal.  Skin: Skin is warm and dry. No rash noted. No erythema.  Psychiatric: He has a normal mood and affect. His behavior is normal. Judgment and thought  content normal.      Assessment and Plan

## 2013-11-29 ENCOUNTER — Telehealth: Payer: Self-pay | Admitting: *Deleted

## 2013-11-29 NOTE — Telephone Encounter (Signed)
Lmom to call our office. Time to sched carotid doppler (6 mth f/u).

## 2014-02-05 ENCOUNTER — Emergency Department: Payer: Self-pay | Admitting: Emergency Medicine

## 2014-03-03 ENCOUNTER — Ambulatory Visit: Payer: Self-pay | Admitting: Oncology

## 2014-03-03 LAB — BASIC METABOLIC PANEL
Anion Gap: 10 (ref 7–16)
BUN: 26 mg/dL — AB (ref 7–18)
CALCIUM: 8.2 mg/dL — AB (ref 8.5–10.1)
Chloride: 105 mmol/L (ref 98–107)
Co2: 26 mmol/L (ref 21–32)
Creatinine: 1.19 mg/dL (ref 0.60–1.30)
EGFR (African American): >60
Glucose: 109 mg/dL — ABNORMAL HIGH (ref 65–99)
Osmolality: 287 (ref 275–301)
Potassium: 4.1 mmol/L (ref 3.5–5.1)
Sodium: 141 mmol/L (ref 136–145)

## 2014-03-03 LAB — CBC CANCER CENTER
Basophil #: 0 x10 3/mm (ref 0.0–0.1)
Basophil %: 0.6 %
EOS ABS: 0.1 x10 3/mm (ref 0.0–0.7)
Eosinophil %: 1.7 %
HCT: 38.5 % — AB (ref 40.0–52.0)
HGB: 12.8 g/dL — AB (ref 13.0–18.0)
LYMPHS ABS: 1.2 x10 3/mm (ref 1.0–3.6)
LYMPHS PCT: 16.4 %
MCH: 30.8 pg (ref 26.0–34.0)
MCHC: 33.1 g/dL (ref 32.0–36.0)
MCV: 93 fL (ref 80–100)
Monocyte #: 0.9 x10 3/mm (ref 0.2–1.0)
Monocyte %: 12.4 %
NEUTROS PCT: 68.9 %
Neutrophil #: 5.1 x10 3/mm (ref 1.4–6.5)
PLATELETS: 226 x10 3/mm (ref 150–440)
RBC: 4.14 10*6/uL — ABNORMAL LOW (ref 4.40–5.90)
RDW: 13.6 % (ref 11.5–14.5)
WBC: 7.5 x10 3/mm (ref 3.8–10.6)

## 2014-03-17 ENCOUNTER — Other Ambulatory Visit: Payer: Self-pay | Admitting: Cardiovascular Disease

## 2014-03-21 ENCOUNTER — Ambulatory Visit: Payer: Self-pay | Admitting: Oncology

## 2014-05-05 ENCOUNTER — Encounter: Payer: Self-pay | Admitting: Cardiovascular Disease

## 2014-05-05 ENCOUNTER — Ambulatory Visit (INDEPENDENT_AMBULATORY_CARE_PROVIDER_SITE_OTHER): Payer: Medicare Other | Admitting: Cardiovascular Disease

## 2014-05-05 ENCOUNTER — Encounter (INDEPENDENT_AMBULATORY_CARE_PROVIDER_SITE_OTHER): Payer: Self-pay

## 2014-05-05 VITALS — BP 130/60 | HR 54 | Ht 69.0 in | Wt 177.5 lb

## 2014-05-05 DIAGNOSIS — I10 Essential (primary) hypertension: Secondary | ICD-10-CM

## 2014-05-05 DIAGNOSIS — R609 Edema, unspecified: Secondary | ICD-10-CM

## 2014-05-05 DIAGNOSIS — I4891 Unspecified atrial fibrillation: Secondary | ICD-10-CM

## 2014-05-05 DIAGNOSIS — E785 Hyperlipidemia, unspecified: Secondary | ICD-10-CM | POA: Insufficient documentation

## 2014-05-05 DIAGNOSIS — I350 Nonrheumatic aortic (valve) stenosis: Secondary | ICD-10-CM

## 2014-05-05 DIAGNOSIS — I6521 Occlusion and stenosis of right carotid artery: Secondary | ICD-10-CM

## 2014-05-05 NOTE — Progress Notes (Signed)
Patient ID: Jason Wade, male    DOB: 1924-04-11, 81 y.o.   MRN: [id number]  HPI Comments: Jason Wade is a 81 year old gentleman with a history of squamous cell cancer, resection, recurrence with long course of XRT, s/p chemotherapy, aortic valve stenosis,  who presented to [facility] from the cancer center with shortness of breath, found to be in atrial fibrillation. He presents for routine followup of his atrial fibrillation   A followup today, he reports that he is doing well.  He is active, reports having no significant symptoms. Blood pressure at home is typically well controlled. He exercises 2 days per week and has been doing so since 2014. He has minimal leg edema No lightheadedness or dizziness  EKG on today's visit shows no sinus rhythm with rate 55 bpm, nonspecific ST abnormality consistent with LVH and repolarization abnormality  He had carotid endarterectomy on the right in February 2015  History of lymphedema in his right arm and has compression therapy on the arm by regular basis. Previous surgery with lymph node resection on the right.   Previous  fall in November 2013 as he walked at [employer]. He cut his head and needed several stitches. Since then his balance has been adequate though he uses a cane. He has a pinched nerve in his leg, chronic neuropathy.   echocardiogram done in the hospital shows normal LV systolic function, diastolic dysfunction, mild LVH, normal right ventricular systolic pressures  No Known Allergies  Outpatient Encounter Prescriptions as of 05/05/2014  Medication Sig  . aspirin 81 MG tablet Take 81 mg by mouth daily.   . cloNIDine (CATAPRES) 0.2 MG tablet Take 0.2 mg by mouth 2 (two) times daily.    Marland Kitchen diltiazem (CARDIZEM CD) 240 MG 24 hr capsule Take 1 capsule (240 mg total) by mouth daily.  . hydrochlorothiazide (HYDRODIURIL) 25 MG tablet TAKE 1 TABLET (25 MG TOTAL) BY MOUTH DAILY.  Marland Kitchen levothyroxine (SYNTHROID, LEVOTHROID) 75 MCG tablet Take 75 mcg by  mouth daily.    . simvastatin (ZOCOR) 20 MG tablet Take 20 mg by mouth at bedtime.    Marland Kitchen terazosin (HYTRIN) 2 MG capsule Take 2 mg by mouth at bedtime.      Past Medical History  Diagnosis Date  . HTN (hypertension)   . Hypothyroidism   . Chronic low back pain   . CKD (chronic kidney disease)   . HLD (hyperlipidemia)   . BPH (benign prostatic hyperplasia)   . Squamous cell carcinoma of skin     a. x3 s/p local excision followed by more extensive surgery due to lymphadenopathy on the right.  . Gout   . PAF (paroxysmal atrial fibrillation)     a. 11/2010 Echo: EF >55%, diast dysfxn, mild conc lvh.  . Carotid arterial disease     a. 2015: s/p R CEA.    Past Surgical History  Procedure Laterality Date  . Cardiac catheterization  04-08-2005  . Laminectomy and microdiscectomy lumbar spine       Bilateral  at L4-5 followed by L4-5 right microdiskectomy.  . Knee surgery  02-09-2007    replacement    Social History  reports that he has never smoked. He has never used smokeless tobacco. He reports that he does not drink alcohol or use illicit drugs.  Family History family history includes Hypertension in his mother and another family member; Lung cancer in an other family member.     Review of Systems  Constitutional: Negative.   Respiratory: Negative.  Cardiovascular: Negative.   Gastrointestinal: Negative.   Musculoskeletal: Positive for gait problem.  Skin: Negative.   Neurological: Negative.   Hematological: Negative.   Psychiatric/Behavioral: Negative.   All other systems reviewed and are negative.   BP 130/60 mmHg  Pulse 54  Ht 5\' 9"  (1.753 m)  Wt 177 lb 8 oz (80.513 kg)  BMI 26.20 kg/m2  Physical Exam  Constitutional: He is oriented to person, place, and time. He appears well-developed and well-nourished.  HENT:  Head: Normocephalic.  Nose: Nose normal.  Mouth/Throat: Oropharynx is clear and moist.  Eyes: Conjunctivae are normal. Pupils are equal, round,  and reactive to light.  Neck: Normal range of motion. Neck supple. No JVD present. Carotid bruit is present.  Cardiovascular: Normal rate, regular rhythm, S1 normal, S2 normal and intact distal pulses.  Exam reveals no gallop and no friction rub.   Murmur heard.  Crescendo systolic murmur is present with a grade of 2/6  Pulmonary/Chest: Effort normal and breath sounds normal. No respiratory distress. He has no wheezes. He has no rales. He exhibits no tenderness.  Abdominal: Soft. Bowel sounds are normal. He exhibits no distension. There is no tenderness.  Musculoskeletal: Normal range of motion. He exhibits no edema or tenderness.  Lymphadenopathy:    He has no cervical adenopathy.  Neurological: He is alert and oriented to person, place, and time. Coordination normal.  Skin: Skin is warm and dry. No rash noted. No erythema.  Psychiatric: He has a normal mood and affect. His behavior is normal. Judgment and thought content normal.      Assessment and Plan   Nursing note and vitals reviewed.

## 2014-05-05 NOTE — Assessment & Plan Note (Signed)
Lower extremity edema likely noncardiac, suspect venous insufficiency

## 2014-05-05 NOTE — Patient Instructions (Signed)
You are doing well. No medication changes were made.  If swelling gets worse in the legs, Wear the compression hose   Please call us if you have new issues that need to be addressed before your next appt.  Your physician wants you to follow-up in: 12 months.  You will receive a reminder letter in the mail two months in advance. If you don't receive a letter, please call our office to schedule the follow-up appointment.

## 2014-05-05 NOTE — Assessment & Plan Note (Signed)
Blood pressure is well controlled on today's visit. No changes made to the medications. 

## 2014-05-05 NOTE — Assessment & Plan Note (Signed)
sclerosis in 2012 with no significant stenosis. Likely has mild aortic valve stenosis. Asymptomatic

## 2014-05-05 NOTE — Assessment & Plan Note (Signed)
We called the office Dr. Hardin Negus and have requested his cholesterol panel

## 2014-05-05 NOTE — Assessment & Plan Note (Signed)
Maintaining normal sinus rhythm. No changes to his medications. 

## 2014-05-05 NOTE — Assessment & Plan Note (Signed)
History of carotid endarterectomy on the right. He does not want repeat ultrasound at this time

## 2014-08-12 NOTE — Op Note (Signed)
PATIENT NAME:  Jason Wade, Jason Wade MR#:  [id number] DATE OF BIRTH:  Oct 29, 1923  DATE OF PROCEDURE:  06/01/2013  PREOPERATIVE DIAGNOSES:  1.  High-grade right carotid artery stenosis.  2.  Moderate left carotid artery stenosis.  3.  Hypertension.   POSTOPERATIVE DIAGNOSES:  1.  High-grade right carotid artery stenosis.  2.  Moderate left carotid artery stenosis.  3.  Hypertension.   PROCEDURE: Right carotid endarterectomy with CorMatrix arterial reconstruction.   SURGEON: Leotis Pain, M.D.   ANESTHESIA: General.   ESTIMATED BLOOD LOSS: Approximately 75 mL.   INDICATION FOR PROCEDURE: This is an 81 year old white male, who has about a 90% right ICA stenosis. He also has a moderate left carotid artery stenosis in the 70% range. He is very active and vigorous despite his age in the 81s. He has an overall good prognosis long-term in reasonably good health. For this reason, we discussed carotid endarterectomy for stroke risk reduction. Risks and benefits were discussed. Informed consent was obtained.  DESCRIPTION OF THE PROCEDURE: The patient is brought to the operative suite and after an adequate level of general anesthesia was obtained, the right neck and chest were sterilely prepped and draped and a sterile surgical field was created. The patient was placed in modified beach chair position. After appropriate surgical timeout and intravenous antibiotics, an incision was created along the anterior border of the sternocleidomastoid dissecting down to the platysma with electrocautery. The [place] retractor was used to help facilitate our exposure. The facial vein was identified and ligated between silk ties as were several venous branches exposing the carotid bifurcation. The common carotid artery, external carotid artery, superior thyroid and internal carotid artery distal lesion were all encircled with vessel loops and the hypoglossal nerve was identified and protected from harm. Control was pulled up  on the vessel loops after systemic heparinization with 6000 units of intravenous heparin and it was allowed to circulate for 5 minutes. An anterior wall arteriotomy was created with an 11 blade and extended with Potts scissors. The [place] shunt was placed first in the internal carotid artery, flushed and de-aired, then in the common carotid artery. Approximately 2 minutes lapsed between clamping and restoration of flow with the shunt. An endarterectomy was then performed in the usual fashion with the [facility]. An eversion endarterectomy was performed on the external carotid artery. The proximal endpoint was cut flush with tenotomy scissors and a nice feathered distal endpoint was created with gentle traction. The distal endpoint was tacked down with two 7-0 Prolene sutures. All loose flecks were removed and the vessel was locally heparinized. The CorMatrix patch was then brought onto the field, was cut and beveled, the distal endpoint was started with a 6-0 Prolene and run approximately one half the length of the arteriotomy. The patch was then cut and beveled to an appropriate length to match the arteriotomy and a second 6-0 Prolene was started at the proximal endpoint.   The shunt was removed with nearing the end of the suture line and the suture line was completed having several circulatory phases through the external carotid artery prior to release of control to the internal carotid artery. Three 6-0 Prolene patch sutures were used for hemostasis. Hemostasis was complete. The wound was then irrigated. Evicel was placed and the wound was then closed with 3 interrupted 3-0 Vicryl in the sternocleidomastoid space, the platysma was closed with a running 3-0 Vicryl and the skin was closed with 4-0 Monocryl. Dermabond was placed as a dressing. The  patient was awakened from anesthesia and taken to the recovery room in stable condition, having tolerated the procedure well and following commands  without any signs of neurologic changes.   ____________________________ Algernon Huxley, MD jsd:aw D: 06/02/2013 12:16:00 ET T: 06/02/2013 12:26:38 ET JOB#: [id number]  cc: Algernon Huxley, MD, <Dictator> Morton Peters., MD Algernon Huxley MD ELECTRONICALLY SIGNED 06/08/2013 10:51

## 2014-08-12 NOTE — Discharge Summary (Signed)
PATIENT NAME:  Jason Wade, Jason Wade MR#:  [id number] DATE OF BIRTH:  Jul 24, 1923  DATE OF ADMISSION:  06/01/2013 DATE OF DISCHARGE:  06/02/2013  ADMITTING AND DISCHARGE DIAGNOSIS:  High-grade right carotid artery stenosis.   ADDITIONAL DIAGNOSES: Include: 1.  Moderate left carotid artery stenosis. 2.  Hypertension.  PROCEDURES PERFORMED WHILE IN THE HOSPITAL: Right carotid endarterectomy. for full details of that, please see the dictated operative summary.   BRIEF HISTORY: An 81 year old white male with a high-grade right carotid artery stenosis. He is brought in for carotid endarterectomy for stroke risk reduction.   HOSPITAL COURSE: The patient was taken to the operating room through same-day surgery where a right carotid endarterectomy was performed. He did well and was taken to the recovery room postoperatively without any neurologic changes. He had some accelerated hypertension that required intravenous drips to help control his blood pressure. These were able to be weaned off with about 6 to 8 hours after his surgery. The patient had baseline hypertension and was placed back on his oral medications and by 24 hours postoperatively he was in the normotensive range, on his oral medicines. He had mild bruising and swelling in his neck, which was quite mild without significant hematoma. His neural exam was normal, his vital signs were stable, and his labs were okay. He was deemed stable for discharge and will be discharged to home accompanied by his family.   DISCHARGE DIET: Regular.   ACTIVITY: As tolerated without any heavy lifting, exertional activity, or driving for 1 week.  DISCHARGE FOLLOWUP: He will return to office in 3 to 4 weeks for carotid duplex.   DISCHARGE MEDICATIONS: Include Synthroid 75 mcg daily, clonidine 0.2 mg b.i.d., terazosin 2 mg daily, Cardizem CD 240 mg daily, simvastatin 20 mg daily, aspirin 81 mg daily, hydrochlorothiazide 25 mg daily, Plavix 75 mg daily, and Norco as  needed for pain.   ____________________________ Algernon Huxley, MD jsd:sb D: 06/02/2013 12:19:01 ET T: 06/02/2013 12:29:23 ET JOB#: [id number]  cc: Algernon Huxley, MD, <Dictator> Morton Peters., MD Algernon Huxley MD ELECTRONICALLY SIGNED 06/08/2013 10:51

## 2014-10-18 ENCOUNTER — Other Ambulatory Visit: Payer: Self-pay | Admitting: Cardiovascular Disease

## 2015-03-06 ENCOUNTER — Other Ambulatory Visit: Payer: Self-pay | Admitting: *Deleted

## 2015-03-06 DIAGNOSIS — C4490 Unspecified malignant neoplasm of skin, unspecified: Secondary | ICD-10-CM

## 2015-03-07 ENCOUNTER — Encounter: Payer: Self-pay | Admitting: Oncology

## 2015-03-07 ENCOUNTER — Inpatient Hospital Stay: Payer: Medicare Other

## 2015-03-07 ENCOUNTER — Inpatient Hospital Stay: Payer: Medicare Other | Attending: Oncology | Admitting: Oncology

## 2015-03-07 VITALS — BP 134/60 | HR 74 | Temp 97.1°F | Resp 16 | Wt 173.5 lb

## 2015-03-07 DIAGNOSIS — I890 Lymphedema, not elsewhere classified: Secondary | ICD-10-CM | POA: Diagnosis not present

## 2015-03-07 DIAGNOSIS — M549 Dorsalgia, unspecified: Secondary | ICD-10-CM | POA: Insufficient documentation

## 2015-03-07 DIAGNOSIS — E785 Hyperlipidemia, unspecified: Secondary | ICD-10-CM | POA: Insufficient documentation

## 2015-03-07 DIAGNOSIS — I129 Hypertensive chronic kidney disease with stage 1 through stage 4 chronic kidney disease, or unspecified chronic kidney disease: Secondary | ICD-10-CM | POA: Insufficient documentation

## 2015-03-07 DIAGNOSIS — N189 Chronic kidney disease, unspecified: Secondary | ICD-10-CM | POA: Insufficient documentation

## 2015-03-07 DIAGNOSIS — C4492 Squamous cell carcinoma of skin, unspecified: Secondary | ICD-10-CM

## 2015-03-07 DIAGNOSIS — Z79899 Other long term (current) drug therapy: Secondary | ICD-10-CM | POA: Diagnosis not present

## 2015-03-07 DIAGNOSIS — E039 Hypothyroidism, unspecified: Secondary | ICD-10-CM | POA: Insufficient documentation

## 2015-03-07 DIAGNOSIS — L989 Disorder of the skin and subcutaneous tissue, unspecified: Secondary | ICD-10-CM | POA: Diagnosis not present

## 2015-03-07 DIAGNOSIS — Z9221 Personal history of antineoplastic chemotherapy: Secondary | ICD-10-CM | POA: Diagnosis not present

## 2015-03-07 DIAGNOSIS — Z85828 Personal history of other malignant neoplasm of skin: Secondary | ICD-10-CM

## 2015-03-07 DIAGNOSIS — C4490 Unspecified malignant neoplasm of skin, unspecified: Secondary | ICD-10-CM

## 2015-03-07 DIAGNOSIS — I480 Paroxysmal atrial fibrillation: Secondary | ICD-10-CM | POA: Insufficient documentation

## 2015-03-07 DIAGNOSIS — Z923 Personal history of irradiation: Secondary | ICD-10-CM | POA: Diagnosis not present

## 2015-03-07 DIAGNOSIS — G8929 Other chronic pain: Secondary | ICD-10-CM | POA: Diagnosis not present

## 2015-03-07 DIAGNOSIS — Z7982 Long term (current) use of aspirin: Secondary | ICD-10-CM | POA: Diagnosis not present

## 2015-03-07 LAB — CBC WITH DIFFERENTIAL/PLATELET
Basophils Absolute: 0 10*3/uL (ref 0–0.1)
Basophils Relative: 0 %
Eosinophils Absolute: 0.1 10*3/uL (ref 0–0.7)
Eosinophils Relative: 1 %
HCT: 38.4 % — ABNORMAL LOW (ref 40.0–52.0)
Hemoglobin: 12.9 g/dL — ABNORMAL LOW (ref 13.0–18.0)
LYMPHS ABS: 1.1 10*3/uL (ref 1.0–3.6)
LYMPHS PCT: 14 %
MCH: 30.5 pg (ref 26.0–34.0)
MCHC: 33.7 g/dL (ref 32.0–36.0)
MCV: 90.8 fL (ref 80.0–100.0)
MONO ABS: 0.8 10*3/uL (ref 0.2–1.0)
Monocytes Relative: 10 %
NEUTROS ABS: 6 10*3/uL (ref 1.4–6.5)
Neutrophils Relative %: 75 %
Platelets: 226 10*3/uL (ref 150–440)
RBC: 4.23 MIL/uL — AB (ref 4.40–5.90)
RDW: 14.6 % — AB (ref 11.5–14.5)
WBC: 8.1 10*3/uL (ref 3.8–10.6)

## 2015-03-07 LAB — BASIC METABOLIC PANEL
ANION GAP: 6 (ref 5–15)
BUN: 31 mg/dL — ABNORMAL HIGH (ref 6–20)
CO2: 25 mmol/L (ref 22–32)
Calcium: 8.6 mg/dL — ABNORMAL LOW (ref 8.9–10.3)
Chloride: 101 mmol/L (ref 101–111)
Creatinine, Ser: 1.1 mg/dL (ref 0.61–1.24)
GFR calc Af Amer: >60 mL/min (ref >60)
GFR calc non Af Amer: 58 mL/min — ABNORMAL LOW (ref >60)
GLUCOSE: 103 mg/dL — AB (ref 65–99)
Potassium: 3.8 mmol/L (ref 3.5–5.1)
Sodium: 132 mmol/L — ABNORMAL LOW (ref 135–145)

## 2015-03-07 NOTE — Progress Notes (Signed)
[facility]  [phone] [phone]) [phone]  ID: LATERRENCE BENNIGHT OB: 1923/08/02  MR#: [id number]  [id number]  Patient Care Team: Morton Peters., MD as PCP - General (Unknown Physician Specialty) Robert Bellow, MD (General Surgery) Lloyd Huger, MD as Consulting Physician (Oncology)  CHIEF COMPLAINT:  Chief Complaint  Patient presents with  . Squamous Cell Carcinoma    INTERVAL HISTORY: Patient returns to clinic today for laboratory work and further evaluation.  He continues to feel well and is asymptomatic. He has a new skin lesion on his right upper extremity. He does not complain of weakness or fatigue today.  He continues to have significant right upper extremity lymphedema which is well controlled with his pump and sleeve.  He has no neurologic complaints.  He has a good appetite and denies weight loss.  He denies any recent fevers or illnesses.  He denies any shortness breath or cough.  He has no nausea, vomiting, constipation, or diarrhea.  He has no urinary complaints.  Patient offers no further specific complaints today.   REVIEW OF SYSTEMS:   Review of Systems  Constitutional: Negative.  Negative for fever and malaise/fatigue.  Respiratory: Negative.   Cardiovascular: Negative.   Musculoskeletal:       Right arm lymphedema   Skin:       New lesion on right arm  Neurological: Negative.  Negative for weakness.    As per HPI. Otherwise, a complete review of systems is negatve.  PAST MEDICAL HISTORY: Past Medical History  Diagnosis Date  . HTN (hypertension)   . Hypothyroidism   . Chronic low back pain   . CKD (chronic kidney disease)   . HLD (hyperlipidemia)   . BPH (benign prostatic hyperplasia)   . Squamous cell carcinoma of skin     a. x3 s/p local excision followed by more extensive surgery due to lymphadenopathy on the right.  . Gout   . PAF (paroxysmal atrial fibrillation) ([place])     a. 11/2010 Echo: EF  >55%, diast dysfxn, mild conc lvh.  . Carotid arterial disease ([place])     a. 2015: s/p R CEA.  . Cancer ([place])     Squamous cell carcinoma of skin x3    PAST SURGICAL HISTORY: Past Surgical History  Procedure Laterality Date  . Cardiac catheterization  04-08-2005  . Laminectomy and microdiscectomy lumbar spine       Bilateral  at L4-5 followed by L4-5 right microdiskectomy.  . Knee surgery  02-09-2007    replacement    FAMILY HISTORY Family History  Problem Relation Age of Onset  . Lung cancer    . Hypertension    . Hypertension Mother        ADVANCED DIRECTIVES:    HEALTH MAINTENANCE: Social History  Substance Use Topics  . Smoking status: Never Smoker   . Smokeless tobacco: Never Used  . Alcohol Use: No     Colonoscopy:  PAP:  Bone density:  Lipid panel:  No Known Allergies  Current Outpatient Prescriptions  Medication Sig Dispense Refill  . aspirin 81 MG tablet Take 81 mg by mouth daily.     . cloNIDine (CATAPRES) 0.2 MG tablet Take 0.2 mg by mouth 2 (two) times daily.      Marland Kitchen diltiazem (CARDIZEM CD) 240 MG 24 hr capsule TAKE 1 CAPSULE DAILY 90 capsule 3  . hydrochlorothiazide (HYDRODIURIL) 25 MG tablet TAKE 1 TABLET (25 MG TOTAL) BY MOUTH DAILY. 90 tablet 3  .  levothyroxine (SYNTHROID, LEVOTHROID) 75 MCG tablet Take 75 mcg by mouth daily.      . simvastatin (ZOCOR) 20 MG tablet Take 20 mg by mouth at bedtime.      Marland Kitchen terazosin (HYTRIN) 2 MG capsule Take 2 mg by mouth at bedtime.       No current facility-administered medications for this visit.    OBJECTIVE: Filed Vitals:   03/07/15 1055  BP: 134/60  Pulse: 74  Temp: 97.1 F (36.2 C)  Resp: 16     Body mass index is 25.61 kg/(m^2).    ECOG FS:0 - Asymptomatic  General: Well-developed, well-nourished, no acute distress. Eyes: Pink conjunctiva, anicteric sclera. Lungs: Clear to auscultation bilaterally. Heart: Regular rate and rhythm. No rubs, murmurs, or gallops. Abdomen: Soft, nontender,  nondistended. No organomegaly noted, normoactive bowel sounds. Musculoskeletal: right arm lymphedema in sleeve.   Neuro: Alert, answering all questions appropriately. Cranial nerves grossly intact. Skin: 1 cm lesion noted on right upper arm. Psych: Normal affect.    LAB RESULTS:  Lab Results  Component Value Date   NA 132* 03/07/2015   K 3.8 03/07/2015   CL 101 03/07/2015   CO2 25 03/07/2015   GLUCOSE 103* 03/07/2015   BUN 31* 03/07/2015   CREATININE 1.10 03/07/2015   CALCIUM 8.6* 03/07/2015   GFRNONAA 58* 03/07/2015   GFRAA >60 03/07/2015    Lab Results  Component Value Date   WBC 8.1 03/07/2015   NEUTROABS 6.0 03/07/2015   HGB 12.9* 03/07/2015   HCT 38.4* 03/07/2015   MCV 90.8 03/07/2015   PLT 226 03/07/2015     STUDIES: No results found.  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  PLAN:    1.  Squamous cell carcinoma: Most likely skin primary. Previously, CT scan revealed no evidence of disease. No further imaging is necessary unless there is suspicion of recurrence.  Patient completed his adjuvant chemotherapy and XRT in September 2012, therefore he can follow-up in 1 year with repeat laboratory work and further evaluation.  2.  Right upper extremity swelling: Continue treatment per lymphedema clinic. 3.  Atrial fibrillation: Heart rate currently well-controlled, treatment per Dr. Rockey Situ. 4.  New skin lesion:  Have recommended evaluation and biopsy by his dermatology physician, Dr. Roma Schanz.  If positive, will consider repeat imaging to assess for further recurrence of his malignancy.   Patient expressed understanding and was in agreement with this plan. He also understands that He can call clinic at any time with any questions, concerns, or complaints.      Lloyd Huger, MD   03/07/2015 11:11 PM

## 2015-03-09 ENCOUNTER — Other Ambulatory Visit: Payer: Self-pay | Admitting: Cardiovascular Disease

## 2015-05-14 ENCOUNTER — Ambulatory Visit (INDEPENDENT_AMBULATORY_CARE_PROVIDER_SITE_OTHER): Payer: Medicare Other | Admitting: Cardiovascular Disease

## 2015-05-14 ENCOUNTER — Encounter: Payer: Self-pay | Admitting: Cardiovascular Disease

## 2015-05-14 VITALS — BP 142/72 | HR 75 | Ht 68.0 in | Wt 171.0 lb

## 2015-05-14 DIAGNOSIS — I10 Essential (primary) hypertension: Secondary | ICD-10-CM

## 2015-05-14 DIAGNOSIS — R600 Localized edema: Secondary | ICD-10-CM | POA: Diagnosis not present

## 2015-05-14 DIAGNOSIS — E785 Hyperlipidemia, unspecified: Secondary | ICD-10-CM

## 2015-05-14 DIAGNOSIS — I4891 Unspecified atrial fibrillation: Secondary | ICD-10-CM | POA: Diagnosis not present

## 2015-05-14 MED ORDER — RIVAROXABAN 15 MG PO TABS
15.0000 mg | ORAL_TABLET | Freq: Every day | ORAL | Status: DC
Start: 2015-05-14 — End: 2015-08-13

## 2015-05-14 MED ORDER — FUROSEMIDE 20 MG PO TABS: 20.0000 mg | ORAL_TABLET | Freq: Every day | ORAL | Status: DC | PRN

## 2015-05-14 NOTE — Progress Notes (Signed)
Patient ID: Jason Wade, male    DOB: 30-Jun-1923, 82 y.o.   MRN: [id number]  HPI Comments: [name] is a 82 year old gentleman with a history of squamous cell cancer, resection, recurrence with long course of XRT, s/p chemotherapy, aortic valve stenosis,  paroxysmal atrial fibrillation/flutter who presents for routine follow-up of his atrial fibrillation   On his last clinic visit one year ago in 2016, he was in normal sinus rhythm Previously indicated he did not want warfarin Numerous attempts were made at prescribing a NOAC. Significant problems with the cost. He does report that our office made some efforts on his behalf and he got the price down to $40 per month. Unclear why he is not taking any of these medications.  He feels that aspirin is working fine  In follow-up today, he reports having palpitations, more leg edema over the past 3-4 months, possibly longer He relates with a cane Denies any significant shortness of breath. No lightheadedness or dizziness  feels the HCTZ is helping his leg edema but still has little bit more  EKG on today's visit shows atrial fibrillation with ventricular rate 75 bpm, nonspecific ST and T wave abnormality in lead V3 through V4, 1 and aVL New arrhythmia compared to prior EKG  Other past medical history reviewed  He had carotid endarterectomy on the right in February 2015  History of lymphedema in his right arm and has compression therapy on the arm by regular basis. Previous surgery with lymph node resection on the right.   Previous  fall in November 2013 as he walked at [employer]. He cut his head and needed several stitches. Since then his balance has been adequate though he uses a cane. He has a pinched nerve in his leg, chronic neuropathy.   echocardiogram done in the hospital shows normal LV systolic function, diastolic dysfunction, mild LVH, normal right ventricular systolic pressures  No Known Allergies  Outpatient Encounter  Prescriptions as of 05/14/2015  Medication Sig  . aspirin 81 MG tablet Take 81 mg by mouth daily.   . cloNIDine (CATAPRES) 0.2 MG tablet Take 0.2 mg by mouth 2 (two) times daily.    Marland Kitchen diltiazem (CARDIZEM CD) 240 MG 24 hr capsule TAKE 1 CAPSULE DAILY  . hydrochlorothiazide (HYDRODIURIL) 25 MG tablet TAKE 1 TABLET (25 MG TOTAL) BY MOUTH DAILY.  Marland Kitchen levothyroxine (SYNTHROID, LEVOTHROID) 75 MCG tablet Take 75 mcg by mouth daily.    . simvastatin (ZOCOR) 20 MG tablet Take 20 mg by mouth at bedtime.    Marland Kitchen terazosin (HYTRIN) 2 MG capsule Take 2 mg by mouth at bedtime.    . furosemide (LASIX) 20 MG tablet Take 1 tablet (20 mg total) by mouth daily as needed.  . Rivaroxaban (XARELTO) 15 MG TABS tablet Take 1 tablet (15 mg total) by mouth daily with supper.   No facility-administered encounter medications on file as of 05/14/2015.    Past Medical History  Diagnosis Date  . HTN (hypertension)   . Hypothyroidism   . Chronic low back pain   . CKD (chronic kidney disease)   . HLD (hyperlipidemia)   . BPH (benign prostatic hyperplasia)   . Squamous cell carcinoma of skin     a. x3 s/p local excision followed by more extensive surgery due to lymphadenopathy on the right.  . Gout   . PAF (paroxysmal atrial fibrillation) ([place])     a. 11/2010 Echo: EF >55%, diast dysfxn, mild conc lvh.  . Carotid arterial disease ([place])  a. 2015: s/p R CEA.  . Cancer ([place])     Squamous cell carcinoma of skin x3    Past Surgical History  Procedure Laterality Date  . Cardiac catheterization  04-08-2005  . Laminectomy and microdiscectomy lumbar spine       Bilateral  at L4-5 followed by L4-5 right microdiskectomy.  . Knee surgery  02-09-2007    replacement    Social History  reports that he has never smoked. He has never used smokeless tobacco. He reports that he does not drink alcohol or use illicit drugs.  Family History family history includes Hypertension in his mother.   Review of Systems   Constitutional: Negative.   Respiratory: Negative.   Cardiovascular: Positive for palpitations and leg swelling.  Gastrointestinal: Negative.   Musculoskeletal: Positive for gait problem.  Skin: Negative.   Neurological: Negative.   Hematological: Negative.   Psychiatric/Behavioral: Negative.   All other systems reviewed and are negative.   BP 142/72 mmHg  Pulse 75  Ht 5\' 8"  (1.727 m)  Wt 171 lb (77.565 kg)  BMI 26.01 kg/m2  Physical Exam  Constitutional: He is oriented to person, place, and time. He appears well-developed and well-nourished.  HENT:  Head: Normocephalic.  Nose: Nose normal.  Mouth/Throat: Oropharynx is clear and moist.  Eyes: Conjunctivae are normal. Pupils are equal, round, and reactive to light.  Neck: Normal range of motion. Neck supple. No JVD present. Carotid bruit is present.  Cardiovascular: Normal rate, S1 normal, S2 normal and intact distal pulses.  An irregularly irregular rhythm present. Exam reveals no gallop and no friction rub.   Murmur heard.  Crescendo systolic murmur is present with a grade of 2/6  Trace edema around the ankles bilaterally  Pulmonary/Chest: Effort normal and breath sounds normal. No respiratory distress. He has no wheezes. He has no rales. He exhibits no tenderness.  Abdominal: Soft. Bowel sounds are normal. He exhibits no distension. There is no tenderness.  Musculoskeletal: Normal range of motion. He exhibits no edema or tenderness.  Lymphadenopathy:    He has no cervical adenopathy.  Neurological: He is alert and oriented to person, place, and time. Coordination normal.  Skin: Skin is warm and dry. No rash noted. No erythema.  Psychiatric: He has a normal mood and affect. His behavior is normal. Judgment and thought content normal.      Assessment and Plan   Nursing note and vitals reviewed.

## 2015-05-14 NOTE — Assessment & Plan Note (Signed)
Blood pressure is well controlled on today's visit. No changes made to the medications. 

## 2015-05-14 NOTE — Patient Instructions (Signed)
You are doing well.  Please take lasix 2 times per week as needed for ankle swelling  Please start xarelto 15 mg daily for blood thinner for atrial fibrillation, stroke prevention  Please call us if you have new issues that need to be addressed before your next appt.  Your physician wants you to follow-up in: 3 months.  You will receive a reminder letter in the mail two months in advance. If you don't receive a letter, please call our office to schedule the follow-up appointment.

## 2015-05-14 NOTE — Assessment & Plan Note (Signed)
Encouraged him to stay on his simvastatin. 

## 2015-05-14 NOTE — Telephone Encounter (Signed)
This encounter was created in error - please disregard.

## 2015-05-14 NOTE — Assessment & Plan Note (Signed)
He will likely have worsening leg edema in the setting of atrial fibrillation. Recommended he take Lasix as needed once or twice per week for worsening leg swelling

## 2015-05-14 NOTE — Assessment & Plan Note (Signed)
Heart rate is relatively well-controlled on his current medications Recurrent atrial fibrillation. He was unaware of this arrhythmia. Some confusion about what it means. Long discussion concerning his options for anticoagulation. We have sought out the help from his friend who is in the waiting room, discussed anticoagulation options with her. We will send in Xarelto 15 mg daily, creatinine clearance slightly less than 50 on calculation today.  Greater than 50% of the clinic time, 25 minutes or more, was spent in counseling and coordination of care with the patient

## 2015-05-16 ENCOUNTER — Telehealth: Payer: Self-pay | Admitting: *Deleted

## 2015-05-16 NOTE — Telephone Encounter (Signed)
Pt c/o medication issue:  1. Name of Medication: Diltiazem    2. How are you currently taking this medication (dosage and times per day)? Takes it in the AM   3. Are you having a reaction (difficulty breathing--STAT)? No   4. What is your medication issue?  About 4 months ago his heart went out of rhythm  So we put pt on a heart thinner. But he states we did not give him anything slow it down.  Would like to know why and if he can be put on something to help slow it down.

## 2015-05-17 NOTE — Telephone Encounter (Signed)
The patient called concerned about the palpitations he has while in atrial fibrillation and was requesting Metoprolol (he was on it in the past but was discontinued due to bradycardia). The patient reports his HR is fine with rest but when he walks around the [employer] store, his HR increases.  I had him check his pulse over the phone and it was 56. I advised him starting Metoprolol would not be advised at this time due to his bradycardia. He is currently on Cardizem CD 240mg  daily. He was instructed to check his pulse and keep a record of his findings so this could be further evaluated in the future.  He reported having picked up his Xarelto yesterday but has not started it yet. The relation between stroke risk and atrial fibrillation was thoroughly reviewed with him. He agrees to try Xarelto at this time. Was instructed what signs to look for in regards to bleeding. All questions were ansered at this time. Appreciated the call. Will call the office if more questions arise.  Signed, Erma Heritage, PA-C 05/17/2015, 3:39 PM Pager: [phone]

## 2015-05-24 ENCOUNTER — Telehealth: Payer: Self-pay | Admitting: Cardiovascular Disease

## 2015-05-24 NOTE — Telephone Encounter (Signed)
Spoke w/ pt.  Advised him of Dr. [name] recommendation.  He reports that he does not currently need any refills, but will call back if he needs them before establishing w/ PCP. Provided pt info to [facility], but he states that he is unfamiliar w/ [address] and will speak w/ his son in law.  Asked him to call back if we can be of further assistance.

## 2015-05-24 NOTE — Telephone Encounter (Signed)
Son in law stopped by the office to let us know pcp is retired and they cannot get in tocuh with him for refills.   Wants to know if Dr. Rockey Situ would consider ordering refill for this time .   Also do we have a suggestion for a new pcp .

## 2015-05-24 NOTE — Telephone Encounter (Signed)
We can refill medications, 30 days with 1 refill Would refer to [facility]

## 2015-06-01 ENCOUNTER — Emergency Department
Admission: EM | Admit: 2015-06-01 | Discharge: 2015-06-01 | Disposition: A | Discharge status: Home / Self Care | Payer: Medicare Other | Attending: Emergency Medicine | Admitting: Emergency Medicine

## 2015-06-01 ENCOUNTER — Emergency Department: Payer: Medicare Other

## 2015-06-01 DIAGNOSIS — I129 Hypertensive chronic kidney disease with stage 1 through stage 4 chronic kidney disease, or unspecified chronic kidney disease: Secondary | ICD-10-CM | POA: Diagnosis not present

## 2015-06-01 DIAGNOSIS — M109 Gout, unspecified: Secondary | ICD-10-CM

## 2015-06-01 DIAGNOSIS — M10072 Idiopathic gout, left ankle and foot: Secondary | ICD-10-CM | POA: Diagnosis not present

## 2015-06-01 DIAGNOSIS — Z7982 Long term (current) use of aspirin: Secondary | ICD-10-CM | POA: Insufficient documentation

## 2015-06-01 DIAGNOSIS — Z79899 Other long term (current) drug therapy: Secondary | ICD-10-CM | POA: Diagnosis not present

## 2015-06-01 DIAGNOSIS — N189 Chronic kidney disease, unspecified: Secondary | ICD-10-CM | POA: Diagnosis not present

## 2015-06-01 DIAGNOSIS — M25562 Pain in left knee: Secondary | ICD-10-CM | POA: Diagnosis present

## 2015-06-01 LAB — CBC WITH DIFFERENTIAL/PLATELET
BASOS ABS: 0.2 10*3/uL — AB (ref 0–0.1)
BASOS PCT: 2 %
EOS PCT: 0 %
Eosinophils Absolute: 0 10*3/uL (ref 0–0.7)
HCT: 38.4 % — ABNORMAL LOW (ref 40.0–52.0)
Hemoglobin: 12.7 g/dL — ABNORMAL LOW (ref 13.0–18.0)
Lymphocytes Relative: 4 %
Lymphs Abs: 0.4 10*3/uL — ABNORMAL LOW (ref 1.0–3.6)
MCH: 29.8 pg (ref 26.0–34.0)
MCHC: 33 g/dL (ref 32.0–36.0)
MCV: 90.4 fL (ref 80.0–100.0)
MONO ABS: 1 10*3/uL (ref 0.2–1.0)
MONOS PCT: 9 %
NEUTROS ABS: 9.4 10*3/uL — AB (ref 1.4–6.5)
Neutrophils Relative %: 85 %
Platelets: 211 10*3/uL (ref 150–440)
RBC: 4.25 MIL/uL — ABNORMAL LOW (ref 4.40–5.90)
RDW: 14.6 % — AB (ref 11.5–14.5)
WBC: 11.1 10*3/uL — ABNORMAL HIGH (ref 3.8–10.6)

## 2015-06-01 LAB — URIC ACID: URIC ACID, SERUM: 9.2 mg/dL — AB (ref 4.4–7.6)

## 2015-06-01 MED ORDER — COLCHICINE 0.6 MG PO TABS: 0.6000 mg | ORAL_TABLET | Freq: Every day | ORAL | Status: DC

## 2015-06-01 NOTE — ED Provider Notes (Signed)
[facility] Emergency Department Provider Note  ____________________________________________  Time seen: Approximately 10:33 AM  I have reviewed the triage vital signs and the nursing notes.   HISTORY  Chief Complaint Knee Pain    HPI Jason Wade is a 82 y.o. male who presents to the emergency department complaining of left knee pain. Patient states that pain began 2 days ago during the middle of the night. Patient states that it is sharp pain with weightbearing. Patient denies any injury to the knee prior to recurrence of pain. Patient denies any significant arthropathy to that knee in the past. Patient denies any fevers or chills, nausea or tingling in distal extremity.Pain is located in the posterior knee radiating to the front. Patient states that he feels like there is a "lump behind the knee."   Past Medical History  Diagnosis Date  . HTN (hypertension)   . Hypothyroidism   . Chronic low back pain   . CKD (chronic kidney disease)   . HLD (hyperlipidemia)   . BPH (benign prostatic hyperplasia)   . Squamous cell carcinoma of skin     a. x3 s/p local excision followed by more extensive surgery due to lymphadenopathy on the right.  . Gout   . PAF (paroxysmal atrial fibrillation) ([place])     a. 11/2010 Echo: EF >55%, diast dysfxn, mild conc lvh.  . Carotid arterial disease ([place])     a. 2015: s/p R CEA.  . Cancer ([place])     Squamous cell carcinoma of skin x3    Patient Active Problem List   Diagnosis Date Noted  . Hyperlipidemia 05/05/2014  . Carotid stenosis 11/04/2013  . Aortic valve stenosis 11/04/2013  . Squamous cell carcinoma of skin of right arm, including shoulder 04/22/2013  . Right carotid bruit 03/28/2013  . Bradycardia by electrocardiogram 09/27/2012  . Edema 09/29/2011  . HTN (hypertension) 12/28/2010  . Atrial fibrillation ([place]) 12/28/2010  . Skin cancer 12/28/2010  . Diastolic dysfunction 0000000    Past Surgical History   Procedure Laterality Date  . Cardiac catheterization  04-08-2005  . Laminectomy and microdiscectomy lumbar spine       Bilateral  at L4-5 followed by L4-5 right microdiskectomy.  . Knee surgery  02-09-2007    replacement    Current Outpatient Rx  Name  Route  Sig  Dispense  Refill  . aspirin 81 MG tablet   Oral   Take 81 mg by mouth daily.          . cloNIDine (CATAPRES) 0.2 MG tablet   Oral   Take 0.2 mg by mouth 2 (two) times daily.           . colchicine 0.6 MG tablet   Oral   Take 1 tablet (0.6 mg total) by mouth daily. Take 2 tablets first day, 1 hour later take 1 more tab for a total of 3 tabs on the 1st day Take 1 tab daily for at least 6 more days. If  Symptoms persist past 6 days continue to use until prescription is finished   20 tablet   0   . diltiazem (CARDIZEM CD) 240 MG 24 hr capsule      TAKE 1 CAPSULE DAILY   90 capsule   3   . furosemide (LASIX) 20 MG tablet   Oral   Take 1 tablet (20 mg total) by mouth daily as needed.   30 tablet   6   . hydrochlorothiazide (HYDRODIURIL) 25 MG tablet  TAKE 1 TABLET (25 MG TOTAL) BY MOUTH DAILY.   90 tablet   0   . levothyroxine (SYNTHROID, LEVOTHROID) 75 MCG tablet   Oral   Take 75 mcg by mouth daily.           . Rivaroxaban (XARELTO) 15 MG TABS tablet   Oral   Take 1 tablet (15 mg total) by mouth daily with supper.   90 tablet   3   . simvastatin (ZOCOR) 20 MG tablet   Oral   Take 20 mg by mouth at bedtime.           Marland Kitchen terazosin (HYTRIN) 2 MG capsule   Oral   Take 2 mg by mouth at bedtime.             Allergies Review of patient's allergies indicates no known allergies.  Family History  Problem Relation Age of Onset  . Lung cancer    . Hypertension    . Hypertension Mother     Social History Social History  Substance Use Topics  . Smoking status: Never Smoker   . Smokeless tobacco: Never Used  . Alcohol Use: No     Review of Systems  Constitutional: No  fever/chills Cardiovascular: no chest pain. Respiratory: no cough. No SOB. Musculoskeletal: Negative for back pain. Positive for left knee pain. Skin: Negative for rash. Neurological: Negative for headaches, focal weakness or numbness. 10-point ROS otherwise negative.  ____________________________________________   PHYSICAL EXAM:  VITAL SIGNS: ED Triage Vitals  Enc Vitals Group     BP 06/01/15 0922 141/88 mmHg     Pulse Rate 06/01/15 0922 77     Resp 06/01/15 0922 21     Temp 06/01/15 0922 97.8 F (36.6 C)     Temp Source 06/01/15 0922 Oral     SpO2 06/01/15 0922 97 %     Weight 06/01/15 0922 170 lb (77.111 kg)     Height 06/01/15 0922 5\' 8"  (1.727 m)     Head Cir --      Peak Flow --      Pain Score 06/01/15 0924 10     Pain Loc --      Pain Edu? --      Excl. in [place]? --      Constitutional: Alert and oriented. Well appearing and in no acute distress. Eyes: Conjunctivae are normal. PERRL. EOMI. Head: Atraumatic. Cardiovascular: Normal rate, regular rhythm. Normal S1 and S2.  Good peripheral circulation. Respiratory: Normal respiratory effort without tachypnea or retractions. Lungs CTAB. Musculoskeletal: No visible deformity to left knee when compared with right. Range of motion and knee. Patient is tender to palpation over the posterior aspect of the knee. No palpable masses or lesions. No ballottement. Minor edema noted surrounding left knee. Area is warm to touch. Neurologic:  Normal speech and language. No gross focal neurologic deficits are appreciated.  Skin:  Skin is warm, dry and intact. No rash noted. Psychiatric: Mood and affect are normal. Speech and behavior are normal. Patient exhibits appropriate insight and judgement.   ____________________________________________   LABS (all labs ordered are listed, but only abnormal results are displayed)  Labs Reviewed  CBC WITH DIFFERENTIAL/PLATELET - Abnormal; Notable for the following:    WBC 11.1 (*)    RBC  4.25 (*)    Hemoglobin 12.7 (*)    HCT 38.4 (*)    RDW 14.6 (*)    Neutro Abs 9.4 (*)    Lymphs Abs 0.4 (*)  Basophils Absolute 0.2 (*)    All other components within normal limits  URIC ACID - Abnormal; Notable for the following:    Uric Acid, Serum 9.2 (*)    All other components within normal limits   ____________________________________________  EKG   ____________________________________________  RADIOLOGY Diamantina [place] Dayrin Stallone, personally viewed and evaluated these images (plain radiographs) as part of my medical decision making, as well as reviewing the written report by the radiologist.  Dg Knee Complete 4 Views Left  06/01/2015  CLINICAL DATA:  Left knee pain worsening for the last 2 3 days. EXAM: LEFT KNEE - COMPLETE 4+ VIEW COMPARISON:  03/15/2012 FINDINGS: Considerable medial compartmental articular cartilage narrowing. Faint chondrocalcinosis. Mild marginal spurring in the medial compartment. No definite knee effusion. Marginal spurring in the patella. Vascular calcification noted. IMPRESSION: 1. Osteoarthritis with considerable medial compartmental narrowing. There is also faint chondrocalcinosis which might raise the possibility of CPPD arthropathy. Electronically Signed   By: Van Clines M.D.   On: 06/01/2015 11:25    ____________________________________________    PROCEDURES  Procedure(s) performed:       Medications - No data to display   ____________________________________________   INITIAL IMPRESSION / ASSESSMENT AND PLAN / ED COURSE  Pertinent labs & imaging results that were available during my care of the patient were reviewed by me and considered in my medical decision making (see chart for details).  Patient's diagnosis is consistent with gouty arthritis to the left knee. Labs were ordered which returned with a high uric acid level. CBC is reassuring at this time. There is no evidence of septic arthritis. Patient does have some  underlying osteoarthritic changes to the left knee is visualized on x-ray.. Patient will be discharged home with prescriptions for colchicine. Patient does have a history of mild chronic kidney disease but last metabolic panel reveals that patient has a GFR of 59.Marland Kitchen Patient is to follow up with primary care provider or orthopedic surgeon if symptoms persist past this treatment course. Patient is given ED precautions to return to the ED for any worsening or new symptoms.     ____________________________________________  FINAL CLINICAL IMPRESSION(S) / ED DIAGNOSES  Final diagnoses:  Acute gout of left knee, unspecified cause      NEW MEDICATIONS STARTED DURING THIS VISIT:  New Prescriptions   COLCHICINE 0.6 MG TABLET    Take 1 tablet (0.6 mg total) by mouth daily. Take 2 tablets first day, 1 hour later take 1 more tab for a total of 3 tabs on the 1st day Take 1 tab daily for at least 6 more days. If  Symptoms persist past 6 days continue to use until prescription is finished        Darletta Moll, PA-C 06/01/15 [place], MD 06/01/15 1601

## 2015-06-01 NOTE — ED Notes (Signed)
Pt here with report of feeling a lump behind his left knee  "I woke up Thursday morning with a knot there and it has sure been hurting me."   Edema noted to left knee   10/10 pain when standing

## 2015-06-01 NOTE — Discharge Instructions (Signed)

## 2015-06-04 ENCOUNTER — Encounter: Payer: Self-pay | Admitting: Family Medicine

## 2015-06-04 ENCOUNTER — Other Ambulatory Visit: Payer: Self-pay | Admitting: Cardiovascular Disease

## 2015-06-04 ENCOUNTER — Ambulatory Visit (INDEPENDENT_AMBULATORY_CARE_PROVIDER_SITE_OTHER): Payer: Medicare Other | Admitting: Family Medicine

## 2015-06-04 VITALS — BP 104/60 | HR 60 | Temp 98.1°F | Ht 66.5 in | Wt 161.2 lb

## 2015-06-04 DIAGNOSIS — I10 Essential (primary) hypertension: Secondary | ICD-10-CM

## 2015-06-04 DIAGNOSIS — D649 Anemia, unspecified: Secondary | ICD-10-CM

## 2015-06-04 DIAGNOSIS — E039 Hypothyroidism, unspecified: Secondary | ICD-10-CM | POA: Diagnosis not present

## 2015-06-04 DIAGNOSIS — E785 Hyperlipidemia, unspecified: Secondary | ICD-10-CM

## 2015-06-04 DIAGNOSIS — N400 Enlarged prostate without lower urinary tract symptoms: Secondary | ICD-10-CM

## 2015-06-04 DIAGNOSIS — I4891 Unspecified atrial fibrillation: Secondary | ICD-10-CM

## 2015-06-04 LAB — IRON: IRON: 21 ug/dL — AB (ref 42–165)

## 2015-06-04 LAB — TSH: TSH: 5.75 u[IU]/mL — ABNORMAL HIGH (ref 0.35–4.50)

## 2015-06-04 LAB — LIPID PANEL
CHOL/HDL RATIO: 3
Cholesterol: 95 mg/dL (ref 0–200)
HDL: 30.2 mg/dL — ABNORMAL LOW (ref >39.00)
LDL Cholesterol: 51 mg/dL (ref 0–99)
NONHDL: 64.72
TRIGLYCERIDES: 70 mg/dL (ref 0.0–149.0)
VLDL: 14 mg/dL (ref 0.0–40.0)

## 2015-06-04 LAB — VITAMIN B12: VITAMIN B 12: 1268 pg/mL — AB (ref 211–911)

## 2015-06-04 LAB — FERRITIN: Ferritin: 87.6 ng/mL (ref 22.0–322.0)

## 2015-06-04 MED ORDER — TERAZOSIN HCL 2 MG PO CAPS: 2.0000 mg | ORAL_CAPSULE | Freq: Every day | ORAL | Status: DC

## 2015-06-04 MED ORDER — CLONIDINE HCL 0.2 MG PO TABS: 0.2000 mg | ORAL_TABLET | Freq: Two times a day (BID) | ORAL | Status: DC

## 2015-06-04 MED ORDER — LEVOTHYROXINE SODIUM 25 MCG PO TABS: 5.0000 ug | ORAL_TABLET | Freq: Every day | ORAL | Status: DC

## 2015-06-04 MED ORDER — SIMVASTATIN 20 MG PO TABS: 20.0000 mg | ORAL_TABLET | Freq: Every day | ORAL | Status: DC

## 2015-06-04 NOTE — Progress Notes (Signed)
Subjective:  Patient ID: Jason Wade, male    DOB: 12-Jan-1924  Age: 82 y.o. MRN: [id number]  CC: Establish care.   HPI Jason Wade is a 82 y.o. male presents to the clinic today to establish care. Concerns/issues are below.   HTN  Well controlled on HCTZ, Clonidine.   Atrial fib  Rate controlled.  Asymptomatic currently. Does report HR increases with exertion.  Doing well on Xarelto.  HLD  Unclear of control.  In need of lipid panel today.  Currently on Simvastatin.  BPH  Gets up nightly x 2.  Stable on Terazosin.  PMH, Surgical Hx, Family Hx, Social History reviewed and updated as below.  Past Medical History  Diagnosis Date  . HTN (hypertension)   . Hypothyroidism   . Chronic low back pain   . CKD (chronic kidney disease)   . HLD (hyperlipidemia)   . BPH (benign prostatic hyperplasia)   . Squamous cell carcinoma of skin     a. x3 s/p local excision followed by more extensive surgery due to lymphadenopathy on the right.  . Gout   . PAF (paroxysmal atrial fibrillation) ([place])     a. 11/2010 Echo: EF >55%, diast dysfxn, mild conc lvh.  . Carotid arterial disease ([place])     a. 2015: s/p R CEA.  . Cancer ([place])     Squamous cell carcinoma of skin x3   Past Surgical History  Procedure Laterality Date  . Cardiac catheterization  04-08-2005  . Laminectomy and microdiscectomy lumbar spine       Bilateral  at L4-5 followed by L4-5 right microdiskectomy.  . Knee surgery  02-09-2007    replacement   Family History  Problem Relation Age of Onset  . Hypertension Mother   . Stroke Mother    Social History  Substance Use Topics  . Smoking status: Never Smoker   . Smokeless tobacco: Never Used  . Alcohol Use: No    Review of Systems Complete ROS was negative today. See scanned document.   Objective:   Today's Vitals: BP 104/60 mmHg  Pulse 60  Temp(Src) 98.1 F (36.7 C) (Oral)  Ht 5' 6.5" (1.689 m)  Wt 161 lb 4 oz (73.143 kg)  BMI 25.64  kg/m2  SpO2 95%  Physical Exam  Constitutional: He appears well-developed. No distress.  HENT:  Head: Normocephalic and atraumatic.  Nose: Nose normal.  Mouth/Throat: Oropharynx is clear and moist. No oropharyngeal exudate.  Normal TM's bilaterally.   Eyes: Conjunctivae are normal. No scleral icterus.  Neck: Neck supple.  Cardiovascular:  Irregularly, irregular. 2/6 systolic murmur.  2+ LE edema.   Pulmonary/Chest: Effort normal and breath sounds normal. He has no wheezes. He has no rales.  Abdominal: Soft. He exhibits no distension. There is no tenderness. There is no rebound and no guarding.  Neurological: He is alert.  Skin: Skin is warm and dry. No rash noted.  Psychiatric: He has a normal mood and affect.  Vitals reviewed.  Assessment & Plan:   Problem List Items Addressed This Visit    Hypothyroidism   Relevant Medications   levothyroxine (SYNTHROID) 25 MCG tablet   Other Relevant Orders   TSH (Completed)   Hyperlipidemia    Lipid panel today.        Relevant Medications   simvastatin (ZOCOR) 20 MG tablet   cloNIDine (CATAPRES) 0.2 MG tablet   terazosin (HYTRIN) 2 MG capsule   Other Relevant Orders   Lipid Profile (Completed)   HTN (  hypertension) - Primary    Well controlled.  Will continue HCTZ and Clonidine.       Relevant Medications   simvastatin (ZOCOR) 20 MG tablet   cloNIDine (CATAPRES) 0.2 MG tablet   terazosin (HYTRIN) 2 MG capsule   BPH (benign prostatic hyperplasia)    Stable. Continue Terazosin.      Atrial fibrillation (HCC)    Stable and rate controlled. Continue Dilt and Xarelto.      Relevant Medications   simvastatin (ZOCOR) 20 MG tablet   cloNIDine (CATAPRES) 0.2 MG tablet   terazosin (HYTRIN) 2 MG capsule   Anemia    Noted upon review of lab work. It appears that he has not had work up for this. Labs today; see orders.       Relevant Orders   Ferritin (Completed)   Iron (Completed)   Iron Binding Cap (TIBC)   B12  (Completed)      Outpatient Encounter Prescriptions as of 06/04/2015  Medication Sig  . cloNIDine (CATAPRES) 0.2 MG tablet Take 1 tablet (0.2 mg total) by mouth 2 (two) times daily.  [place] Kitchen diltiazem (CARDIZEM CD) 240 MG 24 hr capsule TAKE 1 CAPSULE DAILY  . hydrochlorothiazide (HYDRODIURIL) 25 MG tablet TAKE 1 TABLET (25 MG TOTAL) BY MOUTH DAILY.  [place] Kitchen levothyroxine (SYNTHROID) 25 MCG tablet Take 0.5 tablets (12.5 mcg total) by mouth daily before breakfast.  . Rivaroxaban (XARELTO) 15 MG TABS tablet Take 1 tablet (15 mg total) by mouth daily with supper.  . simvastatin (ZOCOR) 20 MG tablet Take 1 tablet (20 mg total) by mouth at bedtime.  [place] Kitchen terazosin (HYTRIN) 2 MG capsule Take 1 capsule (2 mg total) by mouth at bedtime.  . [DISCONTINUED] cloNIDine (CATAPRES) 0.2 MG tablet Take 0.2 mg by mouth 2 (two) times daily.    . [DISCONTINUED] levothyroxine (SYNTHROID) 25 MCG tablet Take 5 mcg by mouth daily before breakfast.  . [DISCONTINUED] simvastatin (ZOCOR) 20 MG tablet Take 20 mg by mouth at bedtime.    . [DISCONTINUED] terazosin (HYTRIN) 2 MG capsule Take 2 mg by mouth at bedtime.    . [DISCONTINUED] aspirin 81 MG tablet Take 81 mg by mouth daily.   . [DISCONTINUED] colchicine 0.6 MG tablet Take 1 tablet (0.6 mg total) by mouth daily. Take 2 tablets first day, 1 hour later take 1 more tab for a total of 3 tabs on the 1st day Take 1 tab daily for at least 6 more days. If  Symptoms persist past 6 days continue to use until prescription is finished  . [DISCONTINUED] furosemide (LASIX) 20 MG tablet Take 1 tablet (20 mg total) by mouth daily as needed.  . [DISCONTINUED] levothyroxine (SYNTHROID, LEVOTHROID) 75 MCG tablet Take 75 mcg by mouth daily.     No facility-administered encounter medications on file as of 06/04/2015.    Follow-up: Return 3-6 months, for Follow up Chronic medical issues.  [place]

## 2015-06-04 NOTE — Progress Notes (Signed)
Pre visit review using our clinic review tool, if applicable. No additional management support is needed unless otherwise documented below in the visit note. 

## 2015-06-04 NOTE — Telephone Encounter (Signed)
Requested Prescriptions   Pending Prescriptions Disp Refills  . hydrochlorothiazide (HYDRODIURIL) 25 MG tablet [Pharmacy Med Name: HYDROCHLOROTHIAZIDE 25 MG TAB] 90 tablet 3    Sig: TAKE 1 TABLET (25 MG TOTAL) BY MOUTH DAILY.

## 2015-06-04 NOTE — Patient Instructions (Addendum)
It was nice to see you today.  Continue your current medications.  We will call with your lab results.   Follow up:  Return 3-6 months, for Follow up Chronic medical issues.  Take care  Dr. Lacinda Axon

## 2015-06-05 ENCOUNTER — Other Ambulatory Visit: Payer: Self-pay | Admitting: Family Medicine

## 2015-06-05 DIAGNOSIS — D649 Anemia, unspecified: Secondary | ICD-10-CM | POA: Insufficient documentation

## 2015-06-05 DIAGNOSIS — N400 Enlarged prostate without lower urinary tract symptoms: Secondary | ICD-10-CM | POA: Insufficient documentation

## 2015-06-05 DIAGNOSIS — E039 Hypothyroidism, unspecified: Secondary | ICD-10-CM | POA: Insufficient documentation

## 2015-06-05 LAB — IRON AND TIBC
%SAT: 10 % — ABNORMAL LOW (ref 15–60)
Iron: 23 ug/dL — ABNORMAL LOW (ref 50–180)
TIBC: 229 ug/dL — ABNORMAL LOW (ref 250–425)
UIBC: 206 ug/dL (ref 125–400)

## 2015-06-05 NOTE — Assessment & Plan Note (Signed)
Stable. Continue Terazosin.

## 2015-06-05 NOTE — Assessment & Plan Note (Signed)
Well controlled.  Will continue HCTZ and Clonidine.

## 2015-06-05 NOTE — Assessment & Plan Note (Signed)
Lipid panel today

## 2015-06-05 NOTE — Assessment & Plan Note (Signed)
Noted upon review of lab work. It appears that he has not had work up for this. Labs today; see orders.

## 2015-06-05 NOTE — Assessment & Plan Note (Signed)
Stable and rate controlled. Continue Dilt and Xarelto.

## 2015-06-06 ENCOUNTER — Telehealth: Payer: Self-pay | Admitting: Family Medicine

## 2015-06-06 ENCOUNTER — Other Ambulatory Visit: Payer: Self-pay | Admitting: Family Medicine

## 2015-06-06 ENCOUNTER — Other Ambulatory Visit: Payer: Self-pay

## 2015-06-06 NOTE — Addendum Note (Signed)
Addended by: Carmin Muskrat on: 06/06/2015 02:28 PM   Modules accepted: Medications

## 2015-06-06 NOTE — Telephone Encounter (Signed)
This is Dr. Jonathon Jordan patient

## 2015-06-06 NOTE — Telephone Encounter (Signed)
Patient was called and given the medication information.

## 2015-06-06 NOTE — Telephone Encounter (Signed)
Pt called and wanted to speak to Dr. Bary Leriche assistant. He wanted to discuss his health.

## 2015-06-06 NOTE — Addendum Note (Signed)
Addended by: Carmin Muskrat on: 06/06/2015 10:09 AM   Modules accepted: Medications

## 2015-06-14 ENCOUNTER — Other Ambulatory Visit: Payer: Medicare Other

## 2015-06-14 DIAGNOSIS — C4492 Squamous cell carcinoma of skin, unspecified: Secondary | ICD-10-CM

## 2015-06-15 ENCOUNTER — Other Ambulatory Visit (INDEPENDENT_AMBULATORY_CARE_PROVIDER_SITE_OTHER): Payer: Medicare Other

## 2015-06-15 ENCOUNTER — Telehealth: Payer: Self-pay | Admitting: Family Medicine

## 2015-06-15 ENCOUNTER — Telehealth: Payer: Self-pay | Admitting: *Deleted

## 2015-06-15 ENCOUNTER — Other Ambulatory Visit: Payer: Self-pay | Admitting: Family Medicine

## 2015-06-15 DIAGNOSIS — D649 Anemia, unspecified: Secondary | ICD-10-CM

## 2015-06-15 LAB — FECAL OCCULT BLOOD, IMMUNOCHEMICAL: Fecal Occult Bld: NEGATIVE

## 2015-06-15 NOTE — Telephone Encounter (Deleted)
Void message!

## 2015-06-15 NOTE — Telephone Encounter (Signed)
Order placed

## 2015-06-15 NOTE — Telephone Encounter (Signed)
Please advise 

## 2015-06-15 NOTE — Telephone Encounter (Signed)
Needing and IFOB order put in the system under future labs under [address].

## 2015-06-15 NOTE — Telephone Encounter (Deleted)
Shelba Flake has requested orders for a Ifob, under future and  [place]  Code [id number]  [place] [phone]

## 2015-06-15 NOTE — Telephone Encounter (Signed)
Patient has requested his lab results from 06/04/15 Please advise  Pt contact [phone]

## 2015-06-15 NOTE — Telephone Encounter (Signed)
Left him a VM to return my call, per the lab result notes he was notified of the results already.

## 2015-06-17 ENCOUNTER — Encounter: Payer: Self-pay | Admitting: Family Medicine

## 2015-06-18 ENCOUNTER — Telehealth: Payer: Self-pay | Admitting: Family Medicine

## 2015-06-18 NOTE — Telephone Encounter (Signed)
Patient given his results and medication recommendations does what to repeat labs due to him not having diarrhea anymore.

## 2015-06-18 NOTE — Telephone Encounter (Signed)
He should have a follow up with Dr. Lacinda Axon. I typically recommend Ferrous sulfate 325mg  po tid for iron deficiency. This is available OTC. This medication can cause some nausea and constipation.

## 2015-06-18 NOTE — Telephone Encounter (Signed)
Patient was called after a mychart message was sent with questions about Iron deficiency. Patient wants to know if there is anything he can take for it until he can be re-evaluated by Dr.Cook.

## 2015-06-20 NOTE — Telephone Encounter (Signed)
Does he want to repeat blood counts and kidney function? If yes, fine to order CBC and CMP.

## 2015-06-20 NOTE — Telephone Encounter (Signed)
He wanted to check Iron and hemoglobin to see if they came up from him not having diarrhea anymore.

## 2015-07-19 ENCOUNTER — Other Ambulatory Visit (INDEPENDENT_AMBULATORY_CARE_PROVIDER_SITE_OTHER): Payer: Medicare Other

## 2015-07-19 ENCOUNTER — Telehealth: Payer: Self-pay | Admitting: *Deleted

## 2015-07-19 DIAGNOSIS — E038 Other specified hypothyroidism: Secondary | ICD-10-CM

## 2015-07-19 LAB — TSH: TSH: 1.76 u[IU]/mL (ref 0.35–4.50)

## 2015-07-19 LAB — T4, FREE: FREE T4: 1.26 ng/dL (ref 0.60–1.60)

## 2015-07-19 LAB — T3, FREE: T3, Free: 2.5 pg/mL (ref 2.3–4.2)

## 2015-07-19 NOTE — Telephone Encounter (Signed)
Labs and dx?  

## 2015-07-19 NOTE — Telephone Encounter (Signed)
TSH, Free T3, Free T4. Dx: Hypothyroidism.

## 2015-08-13 ENCOUNTER — Ambulatory Visit (INDEPENDENT_AMBULATORY_CARE_PROVIDER_SITE_OTHER): Payer: Medicare Other | Admitting: Cardiovascular Disease

## 2015-08-13 ENCOUNTER — Encounter: Payer: Self-pay | Admitting: Cardiovascular Disease

## 2015-08-13 VITALS — BP 124/60 | HR 79 | Ht 66.0 in | Wt 175.2 lb

## 2015-08-13 DIAGNOSIS — R600 Localized edema: Secondary | ICD-10-CM

## 2015-08-13 DIAGNOSIS — I10 Essential (primary) hypertension: Secondary | ICD-10-CM

## 2015-08-13 DIAGNOSIS — N183 Chronic kidney disease, stage 3 unspecified: Secondary | ICD-10-CM

## 2015-08-13 DIAGNOSIS — E785 Hyperlipidemia, unspecified: Secondary | ICD-10-CM

## 2015-08-13 DIAGNOSIS — I4891 Unspecified atrial fibrillation: Secondary | ICD-10-CM

## 2015-08-13 DIAGNOSIS — I350 Nonrheumatic aortic (valve) stenosis: Secondary | ICD-10-CM

## 2015-08-13 MED ORDER — RIVAROXABAN 20 MG PO TABS: 20.0000 mg | ORAL_TABLET | Freq: Every day | ORAL | Status: DC

## 2015-08-13 MED ORDER — METOPROLOL SUCCINATE ER 100 MG PO TB24: 100.0000 mg | ORAL_TABLET | Freq: Every day | ORAL | Status: DC

## 2015-08-13 MED ORDER — CLONIDINE HCL 0.3 MG PO TABS: 0.3000 mg | ORAL_TABLET | Freq: Two times a day (BID) | ORAL | Status: DC

## 2015-08-13 NOTE — Assessment & Plan Note (Signed)
Long discussion concerning his leg edema Possibly exacerbated by calcium channel blocker, diltiazem Long discussion with him and his daughter. Recommended we stop the Cardizem, start metoprolol succinate 100 mg daily Suggested leg elevation, compression hose Symptoms may improve in the next month

## 2015-08-13 NOTE — Progress Notes (Signed)
Patient ID: Jason Wade, male    DOB: 12/26/1923, 82 y.o.   MRN: [id number]  HPI Comments: Jason Wade is an 82 year old gentleman with a history of squamous cell cancer, resection, recurrence with long course of XRT, s/p chemotherapy, aortic valve stenosis,  paroxysmal atrial fibrillation/flutter who presents for routine follow-up of his atrial fibrillation  He presents today with his daughter  In follow-up, he reports that leg edema has been getting worse He has been compliant on his xarelto dose is 15 mg daily. We did creatinine clearance in the office, calculation is 51   He denies any significant shortness of breath, reports that his gait is getting worse Mild unsteadiness. No falls Reports his blood pressures well controlled, likes his medication regimen takes Lasix sparingly  Walks with a cane   denies any significant palpitations or tachycardia   EKG shows atrial fibrillation/flutter, ventricular rate 79 bpm, nonspecific ST and T wave abnormality anterolateral leads  Other past medical history reviewed  He had carotid endarterectomy on the right in February 2015  History of lymphedema in his right arm and has compression therapy on the arm by regular basis. Previous surgery with lymph node resection on the right.   Previous  fall in November 2013 as he walked at [employer]. He cut his head and needed several stitches. Since then his balance has been adequate though he uses a cane. He has a pinched nerve in his leg, chronic neuropathy.   echocardiogram done in the hospital shows normal LV systolic function, diastolic dysfunction, mild LVH, normal right ventricular systolic pressures  No Known Allergies  Outpatient Encounter Prescriptions as of 08/13/2015  Medication Sig  . cloNIDine (CATAPRES) 0.3 MG tablet Take 1 tablet (0.3 mg total) by mouth 2 (two) times daily.  . hydrochlorothiazide (HYDRODIURIL) 25 MG tablet TAKE 1 TABLET (25 MG TOTAL) BY MOUTH DAILY.  [place] Kitchen levothyroxine  (SYNTHROID) 25 MCG tablet Take 0.5 tablets (12.5 mcg total) by mouth daily before breakfast. (Patient taking differently: Take 75 mcg by mouth daily before breakfast. )  . Rivaroxaban (XARELTO) 20 MG TABS tablet Take 1 tablet (20 mg total) by mouth daily with supper.  . simvastatin (ZOCOR) 20 MG tablet Take 1 tablet (20 mg total) by mouth at bedtime.  [place] Kitchen terazosin (HYTRIN) 2 MG capsule Take 1 capsule (2 mg total) by mouth at bedtime.  . [DISCONTINUED] cloNIDine (CATAPRES) 0.2 MG tablet Take 1 tablet (0.2 mg total) by mouth 2 (two) times daily.  . [DISCONTINUED] diltiazem (CARDIZEM CD) 240 MG 24 hr capsule TAKE 1 CAPSULE DAILY  . [DISCONTINUED] Rivaroxaban (XARELTO) 15 MG TABS tablet Take 1 tablet (15 mg total) by mouth daily with supper.  . metoprolol succinate (TOPROL-XL) 100 MG 24 hr tablet Take 1 tablet (100 mg total) by mouth daily. Take with or immediately following a meal.   No facility-administered encounter medications on file as of 08/13/2015.    Past Medical History  Diagnosis Date  . HTN (hypertension)   . Hypothyroidism   . Chronic low back pain   . CKD (chronic kidney disease)   . HLD (hyperlipidemia)   . BPH (benign prostatic hyperplasia)   . Squamous cell carcinoma of skin     a. x3 s/p local excision followed by more extensive surgery due to lymphadenopathy on the right.  . Gout   . PAF (paroxysmal atrial fibrillation) ([place])     a. 11/2010 Echo: EF >55%, diast dysfxn, mild conc lvh.  . Carotid arterial disease ([place])  a. 2015: s/p R CEA.  . Cancer ([place])     Squamous cell carcinoma of skin x3    Past Surgical History  Procedure Laterality Date  . Cardiac catheterization  04-08-2005  . Laminectomy and microdiscectomy lumbar spine       Bilateral  at L4-5 followed by L4-5 right microdiskectomy.  . Knee surgery  02-09-2007    replacement    Social History  reports that he has never smoked. He has never used smokeless tobacco. He reports that he does not drink  alcohol or use illicit drugs.  Family History family history includes Hypertension in his mother; Stroke in his mother.   Review of Systems  Constitutional: Negative.   Respiratory: Negative.   Cardiovascular: Positive for leg swelling.  Gastrointestinal: Negative.   Musculoskeletal: Positive for gait problem.  Skin: Negative.   Neurological: Negative.   Hematological: Negative.   Psychiatric/Behavioral: Negative.   All other systems reviewed and are negative.   BP 124/60 mmHg  Pulse 79  Ht 5\' 6"  (1.676 m)  Wt 175 lb 4 oz (79.493 kg)  BMI 28.30 kg/m2  Physical Exam  Constitutional: He is oriented to person, place, and time. He appears well-developed and well-nourished.  HENT:  Head: Normocephalic.  Nose: Nose normal.  Mouth/Throat: Oropharynx is clear and moist.  Eyes: Conjunctivae are normal. Pupils are equal, round, and reactive to light.  Neck: Normal range of motion. Neck supple. No JVD present. Carotid bruit is present.  Cardiovascular: Normal rate, S1 normal, S2 normal and intact distal pulses.  An irregularly irregular rhythm present. Exam reveals no gallop and no friction rub.   Murmur heard.  Crescendo systolic murmur is present with a grade of 2/6  1-2+ edema to below the knees bilaterally  Pulmonary/Chest: Effort normal and breath sounds normal. No respiratory distress. He has no wheezes. He has no rales. He exhibits no tenderness.  Abdominal: Soft. Bowel sounds are normal. He exhibits no distension. There is no tenderness.  Musculoskeletal: Normal range of motion. He exhibits no edema or tenderness.  Lymphadenopathy:    He has no cervical adenopathy.  Neurological: He is alert and oriented to person, place, and time. Coordination normal.  Skin: Skin is warm and dry. No rash noted. No erythema.  Psychiatric: He has a normal mood and affect. His behavior is normal. Judgment and thought content normal.      Assessment and Plan   Nursing note and vitals  reviewed.

## 2015-08-13 NOTE — Assessment & Plan Note (Signed)
Long discussion concerning his atrial flutter/fibrillation We'll continue anticoagulation, he is relatively asymptomatic He takes Lasix sparingly, denies any shortness of breath or tachycardia

## 2015-08-13 NOTE — Assessment & Plan Note (Signed)
Recommended he stop the Cardizem We will increase clonidine to 0.3 mg twice a day, start metoprolol 100 mg daily Recommended that he monitor his blood pressure at home   Total encounter time more than 25 minutes  Greater than 50% was spent in counseling and coordination of care with the patient

## 2015-08-13 NOTE — Assessment & Plan Note (Signed)
Cholesterol is at goal on the current lipid regimen. No changes to the medications were made.  

## 2015-08-13 NOTE — Assessment & Plan Note (Signed)
GFR of 51, long discussion with him and his daughter Recommended he increase the xarelto up to 20 mg daily for now If GFR changes, we would decrease the dose of xarelto

## 2015-08-13 NOTE — Patient Instructions (Addendum)
You are doing well.  Please stop the cardizem/Diltiazem  Please increase the clonidine up to 0.3 mg twice a day Use the 0.2 first (take 1 and 1/2 pills twice a day)  Please also start metoprolol succinate 100 mg daily  Please call us if you have new issues that need to be addressed before your next appt.  Your physician wants you to follow-up in: 3 months.  You will receive a reminder letter in the mail two months in advance. If you don't receive a letter, please call our office to schedule the follow-up appointment.

## 2015-08-17 ENCOUNTER — Other Ambulatory Visit: Payer: Self-pay | Admitting: *Deleted

## 2015-08-20 ENCOUNTER — Telehealth: Payer: Self-pay | Admitting: Cardiovascular Disease

## 2015-08-20 MED ORDER — DILTIAZEM HCL ER COATED BEADS 240 MG PO CP24: 240.0000 mg | ORAL_CAPSULE | Freq: Every day | ORAL | Status: DC

## 2015-08-20 NOTE — Telephone Encounter (Signed)
Spoke w/ pt.  At ov 08/13/15, he was advised:  "Please stop the cardizem/Diltiazem  Please increase the clonidine up to 0.3 mg twice a day Use the 0.2 first (take 1 and 1/2 pills twice a day)  Please also start metoprolol succinate 100 mg daily"  He reports that since med change, his BP has been high, staying in the 140s/90s. Reports med change was made to "stop my legs from swelling, but they have not improved". He states that his "eyes are burning like they are on fire". Pt requests to go back on his previous med regimen of diltiazem 240 mg daily, as his eyes are making him miserable. He requests a call back w/ Dr. [name] recommendation as soon as possible.

## 2015-08-20 NOTE — Telephone Encounter (Signed)
Pt calling stating we changed his BP medication BP is running high, eyes are burning Would like to go back to what is was before. BP today is 44/85 Please advise.   He stopped the medication we placed him on. Also ankles are still swollen Please advise.

## 2015-08-20 NOTE — Telephone Encounter (Signed)
Spoke w/ pt.  Advised him of Dr. Gollan's recommendation.  He verbalizes understanding and will call back w/ any further questions or concerns.  

## 2015-08-20 NOTE — Telephone Encounter (Signed)
Pt called back, states the swelling in his legs has gone down, and wanted to let us know.

## 2015-08-20 NOTE — Telephone Encounter (Signed)
Dry eyes could be from the clonidine We could go back to the previous dose of clonidine, 1 pill twice a day rather than the higher dose Would stay on metoprolol at higher dose  Would continue to hold diltiazem if he would like, this can keep the leg swelling down I will need to add a different pill for blood pressure control,  if that is okay with him?

## 2015-08-23 ENCOUNTER — Encounter: Payer: Self-pay | Admitting: Family Medicine

## 2015-08-23 ENCOUNTER — Ambulatory Visit (INDEPENDENT_AMBULATORY_CARE_PROVIDER_SITE_OTHER): Payer: Medicare Other | Admitting: Family Medicine

## 2015-08-23 VITALS — BP 120/62 | HR 67 | Temp 97.7°F | Ht 66.0 in | Wt 174.0 lb

## 2015-08-23 DIAGNOSIS — J209 Acute bronchitis, unspecified: Secondary | ICD-10-CM | POA: Insufficient documentation

## 2015-08-23 DIAGNOSIS — H109 Unspecified conjunctivitis: Secondary | ICD-10-CM | POA: Insufficient documentation

## 2015-08-23 MED ORDER — POLYMYXIN B-TRIMETHOPRIM 10000-0.1 UNIT/ML-% OP SOLN: 1.0000 [drp] | Freq: Four times a day (QID) | OPHTHALMIC | Status: DC

## 2015-08-23 MED ORDER — DOXYCYCLINE HYCLATE 100 MG PO TABS: 100.0000 mg | ORAL_TABLET | Freq: Two times a day (BID) | ORAL | Status: DC

## 2015-08-23 NOTE — Assessment & Plan Note (Signed)
Given purulence, treating with polytrim.

## 2015-08-23 NOTE — Patient Instructions (Signed)
You have a respiratory infection.  Use the eye drop 4 times daily for 5 days.  Take the antibiotic twice daily for 1 week.  Call if you worsen.  Take care  Dr. Lacinda Axon

## 2015-08-23 NOTE — Progress Notes (Signed)
   Subjective:  Patient ID: Jason Wade, male    DOB: 04/02/1924  Age: 82 y.o. MRN: [id number]  CC: Cough, ST, Drainage  HPI:  82 year old male presents with the above complaints.  Patient reports that he has been sick for the past 1.5 weeks. He has been experiencing productive cough and associated sore throat.  Symptoms are severe. No associated increased SOB, fever, chills. He has been using salt water gargles with little improvement. No other interventions tried. No other complaints today.  Social Hx   Social History   Social History  . Marital Status: Married    Spouse Name: N/A  . Number of Children: N/A  . Years of Education: N/A   Social History Main Topics  . Smoking status: Never Smoker   . Smokeless tobacco: Never Used  . Alcohol Use: No  . Drug Use: No  . Sexual Activity: Yes   Other Topics Concern  . None   Social History Narrative   Review of Systems  Constitutional: Negative for fever.  HENT: Positive for sore throat.   Respiratory: Positive for cough.    Objective:  BP 120/62 mmHg  Pulse 67  Temp(Src) 97.7 F (36.5 C) (Oral)  Ht 5\' 6"  (1.676 m)  Wt 174 lb (78.926 kg)  BMI 28.10 kg/m2  SpO2 95%  BP/Weight 08/23/2015 08/13/2015 [id number]  Systolic BP 123456 [id number]  Diastolic BP 62 60 60  Wt. (Lbs) 174 175.25 161.25  BMI 28.1 28.3 25.64   Physical Exam  Constitutional: He appears well-developed. No distress.  HENT:  Mouth/Throat: Oropharynx is clear and moist.  Normal TM's bilaterally.  Eyes:  Mild conjunctival injection. Purulent drainage noted from the right eye.  Cardiovascular: Normal rate and regular rhythm.   Pulmonary/Chest: Effort normal and breath sounds normal.  Neurological: He is alert.  Psychiatric: He has a normal mood and affect.  Vitals reviewed.  Lab Results  Component Value Date   WBC 11.1* 06/01/2015   HGB 12.7* 06/01/2015   HCT 38.4* 06/01/2015   PLT 211 06/01/2015   GLUCOSE 103* 03/07/2015   CHOL 95 06/04/2015   TRIG 70.0 06/04/2015   HDL 30.20* 06/04/2015   LDLCALC 51 06/04/2015   ALT 9* 08/16/2013   AST 23 08/16/2013   NA 132* 03/07/2015   K 3.8 03/07/2015   CL 101 03/07/2015   CREATININE 1.10 03/07/2015   BUN 31* 03/07/2015   CO2 25 03/07/2015   TSH 1.76 07/19/2015   INR 1.1 06/02/2013   Assessment & Plan:   Problem List Items Addressed This Visit    Conjunctivitis    Given purulence, treating with polytrim.      Acute bronchitis - Primary    New acute problem. Given comorbidities, treating empirically with Doxycycline.         Meds ordered this encounter  Medications  . trimethoprim-polymyxin b (POLYTRIM) ophthalmic solution    Sig: Place 1 drop into both eyes every 6 (six) hours. For 5 days.    Dispense:  10 mL    Refill:  0  . doxycycline (VIBRA-TABS) 100 MG tablet    Sig: Take 1 tablet (100 mg total) by mouth 2 (two) times daily.    Dispense:  14 tablet    Refill:  0    Follow-up: Has scheduled follow up later this month.   [place]

## 2015-08-23 NOTE — Assessment & Plan Note (Signed)
New acute problem. Given comorbidities, treating empirically with Doxycycline.

## 2015-09-18 ENCOUNTER — Telehealth: Payer: Self-pay | Admitting: Cardiovascular Disease

## 2015-09-18 MED ORDER — CLONIDINE HCL 0.2 MG PO TABS: 0.2000 mg | ORAL_TABLET | Freq: Two times a day (BID) | ORAL | Status: DC

## 2015-09-18 NOTE — Telephone Encounter (Signed)
Pt calling stating he received his prescription CloNIDine, the dose has changed and he would like to know why.  Please call back.

## 2015-09-18 NOTE — Telephone Encounter (Signed)
Spoke w/ pt. Advised him that on his last ov, he and Dr. Rockey Situ discussed increasing his clonidine to 0.3 mg, but he was unable to tolerate this, so he was advised not to increase. He reports that he received mail order meds for clonidine 0.3 mg. He asks that I send in correct dose of clonidine 0.2 mg BID to his mail order pharmacy.  Asked him to call back if we can be of further assistance.

## 2015-10-01 ENCOUNTER — Ambulatory Visit: Payer: Medicare Other | Admitting: Family Medicine

## 2015-10-08 ENCOUNTER — Ambulatory Visit (INDEPENDENT_AMBULATORY_CARE_PROVIDER_SITE_OTHER): Payer: Medicare Other | Admitting: Family Medicine

## 2015-10-08 ENCOUNTER — Encounter: Payer: Self-pay | Admitting: Family Medicine

## 2015-10-08 VITALS — BP 112/62 | HR 60 | Temp 97.7°F | Ht 66.0 in | Wt 177.2 lb

## 2015-10-08 DIAGNOSIS — I4891 Unspecified atrial fibrillation: Secondary | ICD-10-CM | POA: Diagnosis not present

## 2015-10-08 DIAGNOSIS — R600 Localized edema: Secondary | ICD-10-CM

## 2015-10-08 DIAGNOSIS — I10 Essential (primary) hypertension: Secondary | ICD-10-CM | POA: Diagnosis not present

## 2015-10-08 DIAGNOSIS — E785 Hyperlipidemia, unspecified: Secondary | ICD-10-CM

## 2015-10-08 DIAGNOSIS — D649 Anemia, unspecified: Secondary | ICD-10-CM

## 2015-10-08 NOTE — Patient Instructions (Addendum)
Take 1 iron pill twice a day.  Continue your other medications.  Follow up in 6 months.  Take care  Dr. Lacinda Axon

## 2015-10-08 NOTE — Progress Notes (Signed)
Pre visit review using our clinic review tool, if applicable. No additional management support is needed unless otherwise documented below in the visit note. 

## 2015-10-10 NOTE — Assessment & Plan Note (Signed)
Stable. Continue metoprolol and Xarelto.

## 2015-10-10 NOTE — Assessment & Plan Note (Signed)
Mild. Appears to be iron deficiency. Advised use of iron.

## 2015-10-10 NOTE — Assessment & Plan Note (Signed)
No evidence of CHF. Likely from inactivity, venous insufficiency, and perhaps CCB. Advised compression but patient does not want to. Will continue to monitor.

## 2015-10-10 NOTE — Assessment & Plan Note (Signed)
Stabler on HCTZ, Clonidine and Metoprolol. Continue.

## 2015-10-10 NOTE — Assessment & Plan Note (Signed)
Well controlled. Continue Zocor.

## 2015-10-10 NOTE — Progress Notes (Signed)
   Subjective:  Patient ID: Jason Wade, male    DOB: 1923/10/04  Age: 82 y.o. MRN: [id number]  CC: Follow up  HPI:  82 year old male with HTN, HLD, Afib, CKD presents for follow up.  HTN  Well controlled on HCTZ, clonidine, and metoprolol.  HLD  Well controlled on Zocor.  Atrial fib  Stable.  Rate controlled with Toprol.  Compliant with Xarelto.  Edema  Patient reports significant LE edema.   He is very concerned about this.  He has discussed this with cardiology as well.  No reported associated SOB.  Social Hx   Social History   Social History  . Marital Status: Married    Spouse Name: N/A  . Number of Children: N/A  . Years of Education: N/A   Social History Main Topics  . Smoking status: Never Smoker   . Smokeless tobacco: Never Used  . Alcohol Use: No  . Drug Use: No  . Sexual Activity: Yes   Other Topics Concern  . None   Social History Narrative   Review of Systems  Respiratory: Negative.   Cardiovascular: Positive for leg swelling.   Objective:  BP 112/62 mmHg  Pulse 60  Temp(Src) 97.7 F (36.5 C) (Oral)  Ht 5\' 6"  (1.676 m)  Wt 177 lb 4 oz (80.4 kg)  BMI 28.62 kg/m2  SpO2 97%  BP/Weight 10/08/2015 08/23/2015 99991111  Systolic BP [id number]  Diastolic BP 62 62 60  Wt. (Lbs) 177.25 174 175.25  BMI 28.62 28.1 28.3   Physical Exam  Constitutional: He is oriented to person, place, and time. He appears well-developed. No distress.  HENT:  Head: Normocephalic and atraumatic.  Eyes: Conjunctivae are normal. No scleral icterus.  Cardiovascular: Regular rhythm.   Appears to be in sinus today.   Pulmonary/Chest: Effort normal. He has no wheezes. He has no rales.  Neurological: He is alert and oriented to person, place, and time.  Psychiatric: He has a normal mood and affect.  Vitals reviewed.  Lab Results  Component Value Date   WBC 11.1* 06/01/2015   HGB 12.7* 06/01/2015   HCT 38.4* 06/01/2015   PLT 211 06/01/2015   GLUCOSE 103* 03/07/2015   CHOL 95 06/04/2015   TRIG 70.0 06/04/2015   HDL 30.20* 06/04/2015   LDLCALC 51 06/04/2015   ALT 9* 08/16/2013   AST 23 08/16/2013   NA 132* 03/07/2015   K 3.8 03/07/2015   CL 101 03/07/2015   CREATININE 1.10 03/07/2015   BUN 31* 03/07/2015   CO2 25 03/07/2015   TSH 1.76 07/19/2015   INR 1.1 06/02/2013    Assessment & Plan:   Problem List Items Addressed This Visit    HTN (hypertension) - Primary    Stabler on HCTZ, Clonidine and Metoprolol. Continue.      Atrial fibrillation (HCC)    Stable. Continue metoprolol and Xarelto.      Edema    No evidence of CHF. Likely from inactivity, venous insufficiency, and perhaps CCB. Advised compression but patient does not want to. Will continue to monitor.      Hyperlipidemia    Well controlled. Continue Zocor.      Anemia    Mild. Appears to be iron deficiency. Advised use of iron.        Follow-up: Return in about 6 months (around 04/08/2016).  [place]

## 2015-11-05 ENCOUNTER — Other Ambulatory Visit: Payer: Self-pay | Admitting: Family Medicine

## 2015-11-12 ENCOUNTER — Ambulatory Visit (INDEPENDENT_AMBULATORY_CARE_PROVIDER_SITE_OTHER): Payer: Medicare Other | Admitting: Cardiovascular Disease

## 2015-11-12 ENCOUNTER — Encounter: Payer: Self-pay | Admitting: Cardiovascular Disease

## 2015-11-12 VITALS — BP 134/70 | HR 59 | Ht 61.0 in | Wt 176.5 lb

## 2015-11-12 DIAGNOSIS — I10 Essential (primary) hypertension: Secondary | ICD-10-CM | POA: Diagnosis not present

## 2015-11-12 DIAGNOSIS — I4891 Unspecified atrial fibrillation: Secondary | ICD-10-CM

## 2015-11-12 DIAGNOSIS — E785 Hyperlipidemia, unspecified: Secondary | ICD-10-CM

## 2015-11-12 DIAGNOSIS — R600 Localized edema: Secondary | ICD-10-CM

## 2015-11-12 DIAGNOSIS — I350 Nonrheumatic aortic (valve) stenosis: Secondary | ICD-10-CM

## 2015-11-12 NOTE — Patient Instructions (Signed)
Medication Instructions:   No changes  Labwork:  No new labs   Testing/Procedures:  No further testing   Follow-Up: It was a pleasure seeing you in the office today. Please call us if you have new issues that need to be addressed before your next appt.  [phone]  Your physician wants you to follow-up in: 6 months.  You will receive a reminder letter in the mail two months in advance. If you don't receive a letter, please call our office to schedule the follow-up appointment.  If you need a refill on your cardiac medications before your next appointment, please call your pharmacy.

## 2015-11-12 NOTE — Progress Notes (Signed)
Cardiology Office Note  Date:  11/12/2015   ID:  Jason Wade, DOB May 24, 1923, MRN [id number]  PCP:  Jason Spikes, DO   Chief Complaint  Patient presents with  . Other    3 month f/u c/o sob. Meds reviewed verbally with pt.    HPI:  Jason Wade is a 82 year old gentleman with a history of squamous cell cancer, resection, recurrence with long course of XRT, s/p chemotherapy, aortic valve stenosis,  paroxysmal atrial fibrillation/flutter who presents for routine follow-up of his atrial fibrillation  He presents today with his daughter  In follow-up today, there is some medication confusion For most of his visit he did not know his medications, did not know if he was taking diltiazem He continues to have significant pitting of the lower extremities On his last clinic visit we recommended he stop the diltiazem Reports that he takes 8 pills Only 7 on his list He does report swelling has been there for 16 years Has compress socks, does not wear Right arm lymphedema, had swelling, blistering  At the end of his visit, he found a list in his pocket that showed he was still taking diltiazem  Goes to the gym, 2x per week wed/Fri Continues to walk periodically with a cane Denies any significant tachycardia Total chol 95  EKG on today's visit shows atrial fibrillation with ventricular rate 61 bpm, nonspecific ST and T wave abnormality V4 through V6, 1 and aVL  Other past medical history reviewed  He had carotid endarterectomy on the right in February 2015  History of lymphedema in his right arm and has compression therapy on the arm by regular basis. Previous surgery with lymph node resection on the right.   Previous  fall in November 2013 as he walked at [employer]. He cut his head and needed several stitches. Since then his balance has been adequate though he uses a cane. He has a pinched nerve in his leg, chronic neuropathy.   echocardiogram done in the hospital shows normal LV  systolic function, diastolic dysfunction, mild LVH, normal right ventricular systolic pressures  PMH:   has a past medical history of BPH (benign prostatic hyperplasia); Cancer ([place]); Carotid arterial disease ([place]); Chronic low back pain; CKD (chronic kidney disease); Gout; HLD (hyperlipidemia); HTN (hypertension); Hypothyroidism; PAF (paroxysmal atrial fibrillation) ([place]); and Squamous cell carcinoma of skin.  PSH:    Past Surgical History:  Procedure Laterality Date  . CARDIAC CATHETERIZATION  04-08-2005  . KNEE SURGERY  02-09-2007   replacement  . LAMINECTOMY AND MICRODISCECTOMY LUMBAR SPINE      Bilateral  at L4-5 followed by L4-5 right microdiskectomy.    Current Outpatient Prescriptions  Medication Sig Dispense Refill  . cloNIDine (CATAPRES) 0.3 MG tablet Take 0.3 mg by mouth 2 (two) times daily.    . hydrochlorothiazide (HYDRODIURIL) 25 MG tablet TAKE 1 TABLET (25 MG TOTAL) BY MOUTH DAILY. 90 tablet 3  . metoprolol succinate (TOPROL-XL) 100 MG 24 hr tablet Take 1 tablet (100 mg total) by mouth daily. Take with or immediately following a meal. 90 tablet 3  . Rivaroxaban (XARELTO) 20 MG TABS tablet Take 1 tablet (20 mg total) by mouth daily with supper. 90 tablet 3  . simvastatin (ZOCOR) 20 MG tablet Take 1 tablet (20 mg total) by mouth at bedtime. 90 tablet 2  . SYNTHROID 100 MCG tablet TAKE 1 TABLET DAILY 90 tablet 3  . terazosin (HYTRIN) 2 MG capsule Take 1 capsule (2 mg total) by mouth at  bedtime. 90 capsule 2   No current facility-administered medications for this visit.      Allergies:   Review of patient's allergies indicates no known allergies.   Social History:  The patient  reports that he has never smoked. He has never used smokeless tobacco. He reports that he does not drink alcohol or use drugs.   Family History:   family history includes Hypertension in his mother; Stroke in his mother.    Review of Systems: Review of Systems  Constitutional: Negative.    Respiratory: Negative.   Cardiovascular: Positive for palpitations.  Gastrointestinal: Negative.   Musculoskeletal: Negative.   Neurological: Negative.   Psychiatric/Behavioral: Negative.   All other systems reviewed and are negative.    PHYSICAL EXAM: VS:  BP 134/70 (BP Location: Left Arm, Patient Position: Sitting, Cuff Size: Normal)   Pulse (!) 59   Ht 5\' 1"  (1.549 m)   Wt 176 lb 8 oz (80.1 kg)   BMI 33.35 kg/m  , BMI Body mass index is 33.35 kg/m. GEN: Well nourished, well developed, in no acute distress  HEENT: normal  Neck: no JVD, carotid bruits, or masses Cardiac: Irregularly irregular, 2+ SEM RSB,  no rubs, or gallops,no edema  Respiratory:  clear to auscultation bilaterally, normal work of breathing GI: soft, nontender, nondistended, + BS MS: no deformity or atrophy  Skin: warm and dry, no rash Neuro:  Strength and sensation are intact Psych: euthymic mood, full affect    Recent Labs: 03/07/2015: BUN 31; Creatinine, Ser 1.10; Potassium 3.8; Sodium 132 06/01/2015: Hemoglobin 12.7; Platelets 211 07/19/2015: TSH 1.76    Lipid Panel Lab Results  Component Value Date   CHOL 95 06/04/2015   HDL 30.20 (L) 06/04/2015   LDLCALC 51 06/04/2015   TRIG 70.0 06/04/2015      Wt Readings from Last 3 Encounters:  11/12/15 176 lb 8 oz (80.1 kg)  10/08/15 177 lb 4 oz (80.4 kg)  08/23/15 174 lb (78.9 kg)       ASSESSMENT AND PLAN:  Atrial fibrillation, unspecified type ([place]) - Plan: EKG 12-Lead Heart rate relatively well controlled, tolerating anticoagulation  Essential hypertension Recommended he stop the diltiazem. We recommended this on his last clinic visit. He has significant leg swelling Recommended he monitor blood pressure and call our office with numbers next week  Aortic valve stenosis Periodic monitoring of his murmur, echocardiogram in the future if murmur gets louder  Hyperlipidemia Cholesterol is at goal on the current lipid regimen. No  changes to the medications were made.  Localized edema Significant pitting edema likely exacerbated by calcium channel blocker After much back-and-forth discussion, finally we confirmed he is still taking diltiazem With his sister presents, we have recommended he stop the diltiazem and monitor blood pressure -Also recommended a compression sock   Total encounter time more than 25 minutes  Greater than 50% was spent in counseling and coordination of care with the patient   Disposition:   F/U  6 months   Orders Placed This Encounter  Procedures  . EKG 12-Lead     Signed, Esmond Plants, M.D., Ph.D. 11/12/2015  [place], [place]

## 2015-12-06 ENCOUNTER — Encounter: Payer: Self-pay | Admitting: Family Medicine

## 2015-12-06 ENCOUNTER — Ambulatory Visit (INDEPENDENT_AMBULATORY_CARE_PROVIDER_SITE_OTHER): Payer: Medicare Other | Admitting: Family Medicine

## 2015-12-06 ENCOUNTER — Ambulatory Visit (INDEPENDENT_AMBULATORY_CARE_PROVIDER_SITE_OTHER): Payer: Medicare Other

## 2015-12-06 VITALS — BP 161/77 | HR 81 | Temp 98.2°F | Wt 172.2 lb

## 2015-12-06 DIAGNOSIS — S99912A Unspecified injury of left ankle, initial encounter: Secondary | ICD-10-CM

## 2015-12-06 DIAGNOSIS — M25472 Effusion, left ankle: Secondary | ICD-10-CM | POA: Diagnosis not present

## 2015-12-06 DIAGNOSIS — R600 Localized edema: Secondary | ICD-10-CM | POA: Diagnosis not present

## 2015-12-06 NOTE — Patient Instructions (Signed)
We will call with your results.  Follow up closely with me.  Take care  Dr. Lacinda Axon

## 2015-12-06 NOTE — Progress Notes (Signed)
Pre visit review using our clinic review tool, if applicable. No additional management support is needed unless otherwise documented below in the visit note. 

## 2015-12-07 ENCOUNTER — Other Ambulatory Visit: Payer: Self-pay | Admitting: Family Medicine

## 2015-12-07 LAB — BRAIN NATRIURETIC PEPTIDE: PRO B NATRI PEPTIDE: 2175 pg/mL — AB (ref 0.0–100.0)

## 2015-12-07 LAB — COMPREHENSIVE METABOLIC PANEL
ALK PHOS: 48 U/L (ref 39–117)
ALT: 6 U/L (ref 0–53)
AST: 19 U/L (ref 0–37)
Albumin: 3.2 g/dL — ABNORMAL LOW (ref 3.5–5.2)
BUN: 33 mg/dL — ABNORMAL HIGH (ref 6–23)
CHLORIDE: 102 meq/L (ref 96–112)
CO2: 27 mEq/L (ref 19–32)
Calcium: 8.8 mg/dL (ref 8.4–10.5)
Creatinine, Ser: 1.21 mg/dL (ref 0.40–1.50)
GFR: 59.93 mL/min — AB (ref >60.00)
GLUCOSE: 94 mg/dL (ref 70–99)
POTASSIUM: 4.1 meq/L (ref 3.5–5.1)
Sodium: 137 mEq/L (ref 135–145)
TOTAL PROTEIN: 6.5 g/dL (ref 6.0–8.3)
Total Bilirubin: 0.5 mg/dL (ref 0.2–1.2)

## 2015-12-07 MED ORDER — FUROSEMIDE 20 MG PO TABS: 20.0000 mg | ORAL_TABLET | Freq: Every day | ORAL | 0 refills | Status: DC

## 2015-12-10 ENCOUNTER — Other Ambulatory Visit: Payer: Self-pay | Admitting: Family Medicine

## 2015-12-10 ENCOUNTER — Telehealth: Payer: Self-pay | Admitting: Family Medicine

## 2015-12-10 DIAGNOSIS — S99912A Unspecified injury of left ankle, initial encounter: Secondary | ICD-10-CM | POA: Insufficient documentation

## 2015-12-10 DIAGNOSIS — R600 Localized edema: Secondary | ICD-10-CM | POA: Insufficient documentation

## 2015-12-10 DIAGNOSIS — I739 Peripheral vascular disease, unspecified: Secondary | ICD-10-CM

## 2015-12-10 NOTE — Telephone Encounter (Signed)
[name], Mr. Gunkel' daughter called. She said he received a phone call from our office earlier regarding his recent xray but he was asleep. She's wondering if someone from the office can call her with the xray results in addition to any other information Mr. Sarracino may need to know.\  Debbie's ph# [phone] Thank you.

## 2015-12-10 NOTE — Telephone Encounter (Signed)
Pt was spoken with and was given results he gave verbal.

## 2015-12-10 NOTE — Assessment & Plan Note (Signed)
New problem. X-ray obtained. X-ray independently reviewed. Interpretation: No evidence of fracture. Evidence of peripheral vascular disease noted.

## 2015-12-10 NOTE — Assessment & Plan Note (Signed)
Established problem, worsening. This is been persistent and still present despite cessation of calcium channel blocker. BNP was obtained and was markedly elevated. Results returned and patient was started on Lasix by my colleague Dr. Caryl Bis (as I was out of the office). Needs echo.

## 2015-12-10 NOTE — Telephone Encounter (Signed)
Noted  

## 2015-12-10 NOTE — Telephone Encounter (Signed)
Pt called to get Xray results. Please advise?  Call pt @ [phone]. Thank you!

## 2015-12-10 NOTE — Telephone Encounter (Signed)
Did you talk with patient about these results?

## 2015-12-10 NOTE — Progress Notes (Signed)
Subjective:  Patient ID: Jason Wade, male    DOB: [birth year]  Age: 82 y.o. MRN: [id number]  CC: Lower extremity edema, ankle injury  HPI:  82 year old male with diastolic dysfunction, CKD, atrial fibrillation presents with the above complaints.  Patient has had ongoing lower extremity edema. He has been seen by me as well as his cardiologist. He states that on Sunday he twisted his left ankle and developed worsening of his edema. He also developed pain of the ankle.  Patient has now stopped his calcium channel blocker and has had improvement in his edema. He is not using compression stockings regularly as he does not like them. No reports of fall. Patient states that he twisted his ankle while standing up. No known relieving factors. His ankle pain is worse with ambulation. Moderate in severity currently. No other complaints at this time.  Social Hx   Social History   Social History  . Marital status: Married    Spouse name: N/A  . Number of children: N/A  . Years of education: N/A   Social History Main Topics  . Smoking status: Never Smoker  . Smokeless tobacco: Never Used  . Alcohol use No  . Drug use: No  . Sexual activity: Yes   Other Topics Concern  . None   Social History Narrative  . None   Review of Systems  Respiratory: Negative for shortness of breath.   Cardiovascular: Positive for palpitations and leg swelling.   Objective:  BP (!) 161/77 (BP Location: Left Arm, Patient Position: Sitting, Cuff Size: Normal)   Pulse 81   Temp 98.2 F (36.8 C) (Oral)   Wt 172 lb 4 oz (78.1 kg)   SpO2 95%   BMI 32.55 kg/m   BP/Weight 12/06/2015 11/12/2015 7/82/9562  Systolic BP 130 865 784  Diastolic BP 77 70 62  Wt. (Lbs) 172.25 176.5 177.25  BMI 32.55 33.35 28.62   Physical Exam  Constitutional: He appears well-developed. No distress.  Cardiovascular: An irregularly irregular rhythm present.  Pulmonary/Chest: Effort normal. He has no wheezes. He has no rales.    Musculoskeletal:  Left ankle - no discrete areas of tenderness. Marked edema of the lower extremities (2-3+, L>R).   Neurological: He is alert.  Psychiatric: He has a normal mood and affect.  Vitals reviewed.  Lab Results  Component Value Date   WBC 11.1 (H) 06/01/2015   HGB 12.7 (L) 06/01/2015   HCT 38.4 (L) 06/01/2015   PLT 211 06/01/2015   GLUCOSE 94 12/06/2015   CHOL 95 06/04/2015   TRIG 70.0 06/04/2015   HDL 30.20 (L) 06/04/2015   LDLCALC 51 06/04/2015   ALT 6 12/06/2015   AST 19 12/06/2015   NA 137 12/06/2015   K 4.1 12/06/2015   CL 102 12/06/2015   CREATININE 1.21 12/06/2015   BUN 33 (H) 12/06/2015   CO2 27 12/06/2015   TSH 1.76 07/19/2015   INR 1.1 06/02/2013    Assessment & Plan:   Problem List Items Addressed This Visit    Left ankle injury - Primary    New problem. X-ray obtained. X-ray independently reviewed. Interpretation: No evidence of fracture. Evidence of peripheral vascular disease noted.       Relevant Orders   DG Ankle Complete Left (Completed)   Bilateral edema of lower extremity    Established problem, worsening. This is been persistent and still present despite cessation of calcium channel blocker. BNP was obtained and was markedly elevated. Results returned and patient  was started on Lasix by my colleague Dr. Caryl Bis (as I was out of the office). Needs echo.      Relevant Orders   B Nat Peptide (Completed)   Comp Met (CMET) (Completed)    Other Visit Diagnoses   None.     Follow-up: PRN  [place]

## 2015-12-11 ENCOUNTER — Ambulatory Visit (INDEPENDENT_AMBULATORY_CARE_PROVIDER_SITE_OTHER): Payer: Medicare Other | Admitting: Family Medicine

## 2015-12-11 ENCOUNTER — Encounter: Payer: Self-pay | Admitting: Family Medicine

## 2015-12-11 ENCOUNTER — Other Ambulatory Visit: Payer: Self-pay | Admitting: Family Medicine

## 2015-12-11 DIAGNOSIS — R600 Localized edema: Secondary | ICD-10-CM

## 2015-12-11 DIAGNOSIS — Z23 Encounter for immunization: Secondary | ICD-10-CM

## 2015-12-11 MED ORDER — TRAMADOL HCL 50 MG PO TABS: 50.0000 mg | ORAL_TABLET | Freq: Three times a day (TID) | ORAL | 0 refills | Status: DC | PRN

## 2015-12-11 NOTE — Patient Instructions (Addendum)
Use the tramadol as needed for pain.  Be sure to get your echo.  We will try and arrange the referral for later this week.  Take care  Dr. Lacinda Axon

## 2015-12-11 NOTE — Progress Notes (Signed)
Pre visit review using our clinic review tool, if applicable. No additional management support is needed unless otherwise documented below in the visit note. 

## 2015-12-11 NOTE — Assessment & Plan Note (Signed)
Established problem, worsening. No indication for venous doppler as patient is on Xarelto. Treating pain with Tramadol. Arranging vascular ultrasound and Echo. Continue Lasix.

## 2015-12-11 NOTE — Progress Notes (Signed)
Subjective:  Patient ID: Jason Wade, male    DOB: 05/14/1923  Age: 82 y.o. MRN: [id number]  CC: Continued swelling, L leg > R   HPI:  82 year old male with H or fibrillation, hypertension, hyperlipidemia, diastolic dysfunction, CKD, PVD presents with continued complaints of left leg swelling.  Patient was seen last week for this. He had been expericing worsening edema of the left leg after twisting his ankle. At that time he did have swelling of his right leg as well. BNP was obtained and was markedly elevated. Echo has been ordered. Patient was started on Lasix by my colleague Dr. Biagio Quint (in my absence). Additionally, x-ray was obtained and did not reveal any acute fracture. It did reveal vascular calcification consistent with peripheral vascular disease.  Patient is today with continued complaints of leg swelling and associated pain. Patient is very concerned about this. He has several questions today. He reports that his pain has been worsening. Uncontrolled with Tylenol. He states that he's taken the Lasix without significant improvement. No reports of shortness of breath. No other complaints this time.  Social Hx   Social History   Social History  . Marital status: Married    Spouse name: N/A  . Number of children: N/A  . Years of education: N/A   Social History Main Topics  . Smoking status: Never Smoker  . Smokeless tobacco: Never Used  . Alcohol use No  . Drug use: No  . Sexual activity: Yes   Other Topics Concern  . None   Social History Narrative  . None   Review of Systems  Constitutional: Negative.   Respiratory: Negative for shortness of breath.   Cardiovascular: Positive for leg swelling.   Objective:  Pulse 72   Temp 98.3 F (36.8 C) (Oral)   Wt 165 lb 6 oz (75 kg)   SpO2 97%   BMI 31.25 kg/m   BP/Weight 12/11/2015 12/06/2015 [id number]  Systolic BP - [id number] [id number]  Diastolic BP - 77 70  Wt. (Lbs) 165.38 172.25 176.5  BMI 31.25 32.55 33.35    Physical Exam  Constitutional: He appears well-developed. No distress.  Cardiovascular: Normal rate and regular rhythm.   Diminished DP pulse of the left foot but palpable. Foot warm to touch. Slightly delayed cap refill.   Pulmonary/Chest: Effort normal. He has no wheezes. He has no rales.  Musculoskeletal:  Left leg - 2+ pitting edema to the knee. Leg has a nodular appearance, due to pooling of fluid.   Psychiatric:  Anxious.  Vitals reviewed.  Lab Results  Component Value Date   WBC 11.1 (H) 06/01/2015   HGB 12.7 (L) 06/01/2015   HCT 38.4 (L) 06/01/2015   PLT 211 06/01/2015   GLUCOSE 94 12/06/2015   CHOL 95 06/04/2015   TRIG 70.0 06/04/2015   HDL 30.20 (L) 06/04/2015   LDLCALC 51 06/04/2015   ALT 6 12/06/2015   AST 19 12/06/2015   NA 137 12/06/2015   K 4.1 12/06/2015   CL 102 12/06/2015   CREATININE 1.21 12/06/2015   BUN 33 (H) 12/06/2015   CO2 27 12/06/2015   TSH 1.76 07/19/2015   INR 1.1 06/02/2013   Assessment & Plan:   Problem List Items Addressed This Visit    Edema of left lower extremity    Established problem, worsening. No indication for venous doppler as patient is on Xarelto. Treating pain with Tramadol. Arranging vascular ultrasound and Echo. Continue Lasix.        Other Visit  Diagnoses   None.     Meds ordered this encounter  Medications  . traMADol (ULTRAM) 50 MG tablet    Sig: Take 1 tablet (50 mg total) by mouth every 8 (eight) hours as needed.    Dispense:  30 tablet    Refill:  0   Follow-up: PRN  [place]

## 2015-12-14 ENCOUNTER — Ambulatory Visit
Admission: RE | Admit: 2015-12-14 | Discharge: 2015-12-14 | Disposition: A | Discharge status: Home / Self Care | Payer: Medicare Other | Source: Ambulatory Visit | Attending: Family Medicine | Admitting: Family Medicine

## 2015-12-14 DIAGNOSIS — J90 Pleural effusion, not elsewhere classified: Secondary | ICD-10-CM | POA: Diagnosis not present

## 2015-12-14 DIAGNOSIS — I340 Nonrheumatic mitral (valve) insufficiency: Secondary | ICD-10-CM | POA: Diagnosis not present

## 2015-12-14 DIAGNOSIS — R600 Localized edema: Secondary | ICD-10-CM

## 2015-12-14 DIAGNOSIS — I517 Cardiomegaly: Secondary | ICD-10-CM | POA: Insufficient documentation

## 2015-12-14 DIAGNOSIS — E079 Disorder of thyroid, unspecified: Secondary | ICD-10-CM | POA: Insufficient documentation

## 2015-12-14 DIAGNOSIS — F0390 Unspecified dementia without behavioral disturbance: Secondary | ICD-10-CM | POA: Diagnosis not present

## 2015-12-14 DIAGNOSIS — I4891 Unspecified atrial fibrillation: Secondary | ICD-10-CM | POA: Insufficient documentation

## 2015-12-14 DIAGNOSIS — M797 Fibromyalgia: Secondary | ICD-10-CM | POA: Insufficient documentation

## 2015-12-14 DIAGNOSIS — I350 Nonrheumatic aortic (valve) stenosis: Secondary | ICD-10-CM | POA: Diagnosis not present

## 2015-12-14 NOTE — Progress Notes (Signed)
*  PRELIMINARY RESULTS* Echocardiogram 2D Echocardiogram has been performed.  Jason Wade 12/14/2015, 11:41 AM

## 2015-12-18 ENCOUNTER — Telehealth: Payer: Self-pay | Admitting: *Deleted

## 2015-12-18 NOTE — Telephone Encounter (Signed)
Severe aortic stenosis. Normal Ejection fraction. Needs to see [facility] ASAP.

## 2015-12-18 NOTE — Telephone Encounter (Signed)
Please advise for results, thanks

## 2015-12-18 NOTE — Telephone Encounter (Signed)
Patients daughter requested echocardiogram results if they are ready to view. Pt Contact [phone]

## 2015-12-18 NOTE — Telephone Encounter (Signed)
Spoke with the patient and the daughter, reviewed results, thanks

## 2015-12-19 ENCOUNTER — Other Ambulatory Visit
Admission: RE | Admit: 2015-12-19 | Discharge: 2015-12-19 | Disposition: A | Discharge status: Home / Self Care | Payer: Medicare Other | Source: Ambulatory Visit | Attending: *Deleted | Admitting: *Deleted

## 2015-12-19 ENCOUNTER — Encounter: Payer: Self-pay | Admitting: Physician Assistant

## 2015-12-19 ENCOUNTER — Other Ambulatory Visit: Payer: Self-pay

## 2015-12-19 ENCOUNTER — Encounter: Payer: Self-pay | Admitting: Emergency Medicine

## 2015-12-19 ENCOUNTER — Emergency Department
Admission: EM | Admit: 2015-12-19 | Discharge: 2015-12-19 | Disposition: A | Discharge status: Home / Self Care | Payer: Medicare Other | Attending: Emergency Medicine | Admitting: Emergency Medicine

## 2015-12-19 ENCOUNTER — Ambulatory Visit (INDEPENDENT_AMBULATORY_CARE_PROVIDER_SITE_OTHER): Payer: Medicare Other | Admitting: Physician Assistant

## 2015-12-19 ENCOUNTER — Emergency Department: Payer: Medicare Other

## 2015-12-19 VITALS — BP 122/80 | HR 69 | Ht 66.5 in | Wt 166.0 lb

## 2015-12-19 DIAGNOSIS — R296 Repeated falls: Secondary | ICD-10-CM | POA: Insufficient documentation

## 2015-12-19 DIAGNOSIS — N182 Chronic kidney disease, stage 2 (mild): Secondary | ICD-10-CM | POA: Insufficient documentation

## 2015-12-19 DIAGNOSIS — E785 Hyperlipidemia, unspecified: Secondary | ICD-10-CM

## 2015-12-19 DIAGNOSIS — W19XXXA Unspecified fall, initial encounter: Secondary | ICD-10-CM | POA: Insufficient documentation

## 2015-12-19 DIAGNOSIS — I779 Disorder of arteries and arterioles, unspecified: Secondary | ICD-10-CM

## 2015-12-19 DIAGNOSIS — E039 Hypothyroidism, unspecified: Secondary | ICD-10-CM | POA: Insufficient documentation

## 2015-12-19 DIAGNOSIS — N184 Chronic kidney disease, stage 4 (severe): Secondary | ICD-10-CM | POA: Insufficient documentation

## 2015-12-19 DIAGNOSIS — I480 Paroxysmal atrial fibrillation: Secondary | ICD-10-CM

## 2015-12-19 DIAGNOSIS — I5032 Chronic diastolic (congestive) heart failure: Secondary | ICD-10-CM | POA: Insufficient documentation

## 2015-12-19 DIAGNOSIS — Z79899 Other long term (current) drug therapy: Secondary | ICD-10-CM | POA: Diagnosis not present

## 2015-12-19 DIAGNOSIS — I10 Essential (primary) hypertension: Secondary | ICD-10-CM

## 2015-12-19 DIAGNOSIS — R0602 Shortness of breath: Secondary | ICD-10-CM | POA: Insufficient documentation

## 2015-12-19 DIAGNOSIS — W19XXXS Unspecified fall, sequela: Secondary | ICD-10-CM

## 2015-12-19 DIAGNOSIS — I5033 Acute on chronic diastolic (congestive) heart failure: Secondary | ICD-10-CM | POA: Diagnosis not present

## 2015-12-19 DIAGNOSIS — I350 Nonrheumatic aortic (valve) stenosis: Secondary | ICD-10-CM

## 2015-12-19 DIAGNOSIS — I130 Hypertensive heart and chronic kidney disease with heart failure and stage 1 through stage 4 chronic kidney disease, or unspecified chronic kidney disease: Secondary | ICD-10-CM | POA: Insufficient documentation

## 2015-12-19 DIAGNOSIS — G5790 Unspecified mononeuropathy of unspecified lower limb: Secondary | ICD-10-CM

## 2015-12-19 DIAGNOSIS — I739 Peripheral vascular disease, unspecified: Secondary | ICD-10-CM

## 2015-12-19 DIAGNOSIS — M7989 Other specified soft tissue disorders: Secondary | ICD-10-CM

## 2015-12-19 DIAGNOSIS — R0600 Dyspnea, unspecified: Secondary | ICD-10-CM

## 2015-12-19 LAB — BASIC METABOLIC PANEL
ANION GAP: 6 (ref 5–15)
BUN: 39 mg/dL — ABNORMAL HIGH (ref 6–20)
CALCIUM: 8.7 mg/dL — AB (ref 8.9–10.3)
CO2: 29 mmol/L (ref 22–32)
Chloride: 100 mmol/L — ABNORMAL LOW (ref 101–111)
Creatinine, Ser: 1.13 mg/dL (ref 0.61–1.24)
GFR, EST NON AFRICAN AMERICAN: 56 mL/min — AB (ref >60)
GLUCOSE: 93 mg/dL (ref 65–99)
Potassium: 3.6 mmol/L (ref 3.5–5.1)
Sodium: 135 mmol/L (ref 135–145)

## 2015-12-19 LAB — COMPREHENSIVE METABOLIC PANEL
ALBUMIN: 3.3 g/dL — AB (ref 3.5–5.0)
ALK PHOS: 58 U/L (ref 38–126)
ALT: 13 U/L — ABNORMAL LOW (ref 17–63)
AST: 28 U/L (ref 15–41)
Anion gap: 7 (ref 5–15)
BILIRUBIN TOTAL: 0.4 mg/dL (ref 0.3–1.2)
BUN: 40 mg/dL — AB (ref 6–20)
CALCIUM: 8.8 mg/dL — AB (ref 8.9–10.3)
CO2: 29 mmol/L (ref 22–32)
Chloride: 100 mmol/L — ABNORMAL LOW (ref 101–111)
Creatinine, Ser: 1.32 mg/dL — ABNORMAL HIGH (ref 0.61–1.24)
GFR calc Af Amer: 53 mL/min — ABNORMAL LOW (ref >60)
GFR, EST NON AFRICAN AMERICAN: 46 mL/min — AB (ref >60)
GLUCOSE: 120 mg/dL — AB (ref 65–99)
Potassium: 3.6 mmol/L (ref 3.5–5.1)
Sodium: 136 mmol/L (ref 135–145)
TOTAL PROTEIN: 6.5 g/dL (ref 6.5–8.1)

## 2015-12-19 LAB — FIBRIN DERIVATIVES D-DIMER (ARMC ONLY)
Fibrin derivatives D-dimer (ARMC): 1571 — ABNORMAL HIGH (ref 0–499)
Fibrin derivatives D-dimer (ARMC): 1568 — ABNORMAL HIGH (ref 0–499)

## 2015-12-19 LAB — CBC WITH DIFFERENTIAL/PLATELET
BASOS PCT: 1 %
Basophils Absolute: 0.1 10*3/uL (ref 0–0.1)
Eosinophils Absolute: 0.1 10*3/uL (ref 0–0.7)
Eosinophils Relative: 2 %
HEMATOCRIT: 37.9 % — AB (ref 40.0–52.0)
HEMOGLOBIN: 13.1 g/dL (ref 13.0–18.0)
LYMPHS PCT: 14 %
Lymphs Abs: 1 10*3/uL (ref 1.0–3.6)
MCH: 31.3 pg (ref 26.0–34.0)
MCHC: 34.5 g/dL (ref 32.0–36.0)
MCV: 90.7 fL (ref 80.0–100.0)
MONO ABS: 0.6 10*3/uL (ref 0.2–1.0)
MONOS PCT: 9 %
NEUTROS ABS: 5.1 10*3/uL (ref 1.4–6.5)
NEUTROS PCT: 74 %
Platelets: 242 10*3/uL (ref 150–440)
RBC: 4.18 MIL/uL — ABNORMAL LOW (ref 4.40–5.90)
RDW: 15.5 % — ABNORMAL HIGH (ref 11.5–14.5)
WBC: 6.9 10*3/uL (ref 3.8–10.6)

## 2015-12-19 LAB — BRAIN NATRIURETIC PEPTIDE: B NATRIURETIC PEPTIDE 5: 1970 pg/mL — AB (ref 0.0–100.0)

## 2015-12-19 MED ORDER — IOPAMIDOL (ISOVUE-370) INJECTION 76%
75.0000 mL | Freq: Once | INTRAVENOUS | Status: AC | PRN
  Administered 2015-12-19: 75 mL via INTRAVENOUS

## 2015-12-19 NOTE — ED Notes (Signed)
Pt at [facility], and Dr Joni Fears in room updating family on POC at this time.

## 2015-12-19 NOTE — ED Provider Notes (Signed)
[facility] Emergency Department Provider Note  ____________________________________________  Time seen: Approximately 8:21 PM  I have reviewed the triage vital signs and the nursing notes.   HISTORY  Chief Complaint ABNORMAL LABS (possible blood clot in lung)    HPI FERMON FRON is a 82 y.o. male complains of chronic dyspnea over the past month, gradually worsening. Saw cardiology who checked labs on an elevated d-dimer. I instructed the patient to increase his diuretic over the next 4 days as planned for reassessment next week with Holter monitoring.Denies chest pain.  Does report that he twisted his ankle getting out of bed in the middle the night a few weeks ago and had some leg swelling at that time which has resolved. No fevers chills or cough.   Past Medical History:  Diagnosis Date  . BPH (benign prostatic hyperplasia)   . Carotid arterial disease ([place])    a. 2015: s/p R CEA.  . Chronic diastolic CHF (congestive heart failure) ([place])    a. echo 11/2015: EF 55-60%, possible HK of inf wall, severe AS mean gradient 29, peak gradient 45, valve area 0.41, mild to mod MR, LA dilated @ 50 mild RA dilatation, left pleural effusion  . Chronic low back pain   . CKD (chronic kidney disease), stage II   . Essential hypertension   . Falls   . Gout   . HLD (hyperlipidemia)   . Hypothyroidism   . Lower extremity neuropathy   . PAF (paroxysmal atrial fibrillation) ([place])    a. 11/2010 Echo: EF >55%, diast dysfxn, mild conc lvh.  . Severe aortic stenosis    a. echo 11/2015 w/ mean gradient 29 mmHg, peak gradient 45 mmHg, valve area 0.41 cm^2  . Squamous cell carcinoma of skin    a. x3 s/p local excision followed by more extensive surgery due to lymphadenopathy on the right.     Patient Active Problem List   Diagnosis Date Noted  . Carotid arterial disease ([place])   . Chronic diastolic CHF (congestive heart failure) ([place])   . CKD (chronic kidney disease),  stage II   . Essential hypertension   . Falls   . HLD (hyperlipidemia)   . Lower extremity neuropathy   . PAF (paroxysmal atrial fibrillation) ([place])   . Severe aortic stenosis   . Edema of left lower extremity 12/10/2015  . Left ankle injury 12/10/2015  . BPH (benign prostatic hyperplasia) 06/05/2015  . Hypothyroidism 06/05/2015  . Anemia 06/05/2015  . Hyperlipidemia 05/05/2014  . Carotid stenosis 11/04/2013  . Squamous cell carcinoma of skin of right arm, including shoulder 04/22/2013  . Right carotid bruit 03/28/2013     Past Surgical History:  Procedure Laterality Date  . CARDIAC CATHETERIZATION  04-08-2005  . KNEE SURGERY  02-09-2007   replacement  . LAMINECTOMY AND MICRODISCECTOMY LUMBAR SPINE      Bilateral  at L4-5 followed by L4-5 right microdiskectomy.     Prior to Admission medications   Medication Sig Start Date End Date Taking? Authorizing Provider  cloNIDine (CATAPRES) 0.3 MG tablet Take 0.3 mg by mouth 2 (two) times daily.    Historical Provider, MD  furosemide (LASIX) 20 MG tablet Take 1 tablet (20 mg total) by mouth daily. 12/07/15   Leone Haven, MD  hydrochlorothiazide (HYDRODIURIL) 25 MG tablet TAKE 1 TABLET (25 MG TOTAL) BY MOUTH DAILY. 06/04/15   Minna Merritts, MD  metoprolol succinate (TOPROL-XL) 100 MG 24 hr tablet Take 1 tablet (100 mg total)  by mouth daily. Take with or immediately following a meal. 08/13/15   Minna Merritts, MD  Rivaroxaban (XARELTO) 20 MG TABS tablet Take 1 tablet (20 mg total) by mouth daily with supper. 08/13/15   Minna Merritts, MD  simvastatin (ZOCOR) 20 MG tablet Take 1 tablet (20 mg total) by mouth at bedtime. 06/04/15   Coral Spikes, DO  SYNTHROID 100 MCG tablet TAKE 1 TABLET DAILY 11/05/15   Coral Spikes, DO  terazosin (HYTRIN) 2 MG capsule Take 1 capsule (2 mg total) by mouth at bedtime. 06/04/15   Coral Spikes, DO  traMADol (ULTRAM) 50 MG tablet Take 1 tablet (50 mg total) by mouth every 8 (eight) hours as needed.  12/11/15   Coral Spikes, DO     Allergies Review of patient's allergies indicates no known allergies.   Family History  Problem Relation Age of Onset  . Hypertension Mother   . Stroke Mother     Social History Social History  Substance Use Topics  . Smoking status: Never Smoker  . Smokeless tobacco: Never Used  . Alcohol use No    Review of Systems  Constitutional:   No fever or chills.  ENT:   No sore throat. No rhinorrhea. Cardiovascular:   No chest pain. Respiratory:   Positive chronic shortness of breath without cough. No orthopnea. Gastrointestinal:   Negative for abdominal pain, vomiting and diarrhea.  Genitourinary:   Negative for dysuria or difficulty urinating. Musculoskeletal:   Negative for focal pain or swelling Neurological:   Negative for headaches 10-point ROS otherwise negative.  ____________________________________________   PHYSICAL EXAM:  VITAL SIGNS: ED Triage Vitals  Enc Vitals Group     BP 12/19/15 1823 (!) 136/58     Pulse Rate 12/19/15 1823 62     Resp 12/19/15 1823 18     Temp 12/19/15 1823 97.7 F (36.5 C)     Temp Source 12/19/15 1823 Oral     SpO2 12/19/15 1823 96 %     Weight 12/19/15 1824 167 lb (75.8 kg)     Height 12/19/15 1824 5\' 6"  (1.676 m)     Head Circumference --      Peak Flow --      Pain Score --      Pain Loc --      Pain Edu? --      Excl. in [place]? --     Vital signs reviewed, nursing assessments reviewed.   Constitutional:   Alert and oriented. Well appearing and in no distress. Eyes:   No scleral icterus. No conjunctival pallor. PERRL. EOMI.  No nystagmus. ENT   Head:   Normocephalic and atraumatic.   Nose:   No congestion/rhinnorhea. No septal hematoma   Mouth/Throat:   MMM, no pharyngeal erythema. No peritonsillar mass.    Neck:   No stridor. No SubQ emphysema. No meningismus.No JVD Hematological/Lymphatic/Immunilogical:   No cervical lymphadenopathy. Cardiovascular:   RRR. Symmetric  bilateral radial and DP pulses.  No murmurs.  Respiratory:   Normal respiratory effort without tachypnea nor retractions. Breath sounds are clear and equal bilaterally. No wheezes/rales/rhonchi. No orthopnea on exam Gastrointestinal:   Soft and nontender. Non distended. There is no CVA tenderness.  No rebound, rigidity, or guarding. Genitourinary:   deferred Musculoskeletal:   Nontender with normal range of motion in all extremities. No joint effusions.  No lower extremity tenderness.  No edema. Neurologic:   Normal speech and language.  CN 2-10 normal. Motor grossly  intact. No gross focal neurologic deficits are appreciated.  Skin:    Skin is warm, dry and intact. No rash noted.  No petechiae, purpura, or bullae.  ____________________________________________    LABS (pertinent positives/negatives) (all labs ordered are listed, but only abnormal results are displayed) Labs Reviewed  FIBRIN DERIVATIVES D-DIMER ([facility] ONLY) - Abnormal; Notable for the following:       Result Value   Fibrin derivatives D-dimer [facility]) 1,568 (*)    All other components within normal limits  CBC WITH DIFFERENTIAL/PLATELET - Abnormal; Notable for the following:    RBC 4.18 (*)    HCT 37.9 (*)    RDW 15.5 (*)    All other components within normal limits  COMPREHENSIVE METABOLIC PANEL - Abnormal; Notable for the following:    Chloride 100 (*)    Glucose, Bld 120 (*)    BUN 40 (*)    Creatinine, Ser 1.32 (*)    Calcium 8.8 (*)    Albumin 3.3 (*)    ALT 13 (*)    GFR calc non Af Amer 46 (*)    GFR calc Af Amer 53 (*)    All other components within normal limits   ____________________________________________   EKG  Interpreted by me Atrial fibrillation rate of 65, normal axis and intervals. Normal QRS. Isolated T-wave inversion in lead 2 which is nonspecific and unchanged from July 2017  ____________________________________________    RADIOLOGY  CT angiogram chest negative for PE. Reveals small  bilateral pleural effusions. No evidence of infectious infiltrate  ____________________________________________   PROCEDURES Procedures  ____________________________________________   INITIAL IMPRESSION / ASSESSMENT AND PLAN / ED COURSE  Pertinent labs & imaging results that were available during my care of the patient were reviewed by me and considered in my medical decision making (see chart for details).  Patient well appearing no acute distress. Presents with subacute to chronic shortness of breath. His arteries in cardiology who is managing by increasing his diuretic. Sent to the ED for evaluation due to elevated d-dimer. CT negative. Labs unremarkable. EKG is nonischemic. Symptoms and concerning for ACS or acute heart failure. Has no orthopnea on exam, no JVD. We'll discharge home follow up with Dr. [name] plan.     Clinical Course   ____________________________________________   FINAL CLINICAL IMPRESSION(S) / ED DIAGNOSES  Final diagnoses:  Dyspnea  Small bilateral pleural effusions     Portions of this note were generated with dragon dictation software. Dictation errors may occur despite best attempts at proofreading.    Carrie Mew, MD 12/19/15 2028

## 2015-12-19 NOTE — Progress Notes (Signed)
Cardiology Office Note Date:  12/19/2015  Patient ID:  Jason Wade, Jason Wade 1924-01-05, MRN [id number] PCP:  Coral Spikes, DO  Cardiologist:  Dr. Rockey Situ, MD    Chief Complaint: LE swelling/aortic stenosis   History of Present Illness: Jason Wade is a 82 y.o. male with history of PAF on Xarelto, chronic diastolic CHF, severe aortic stenosis, long standing LE edema for greater than 16 years noncompliant with compression stockings, squamous cell cancer s/p resection and radiation, right arm lymphedema 2/2 lymph node resection, carotid artery disease s/p carotid enarterectomy 05/2013, history of falls, LE neuropathy, CKD stage II, HTN, HLD, and hypothyroidism who presents for evaluation of his aortic stenosis and LE edema.   He was most recently seen by Dr. Rockey Situ on 11/12/15 for routine follow up of his Afib, and was noted to be in rate-controlled Afib. At that time there was some confusion regarding his medications. He had previously been advised to stop diltiazem in April 2017, though he did not. In July, he was noted to have continued pitting edema of his LE. It was felt his diltiazem may have been playing a role in the swelling and was discontinued. He stopped the diltiazem in July with some reported improvement in his LE swelling. Unfortunately, he twisted his right ankle and noted increase in LE swelling up to the knee. He has been seeing his PCP for the ankle and swelling. BNP was checked on 12/06/15 and found to be 2100. He was started on low-dose Lasix 20 mg daily without improvement in his swelling. LE doppler to evaluate for DVT was not felt to be indicated given Xarelto usage. Plain film of the left ankle was negative for fracture, though did show vascular calcification c/w PVD. He has continued to have significant pain along his left LE. Never with any SOB or chest pain. Echo was performed on 8/25 and showed an EF of 55-60%, possible HK of the inferior myocardium, severe aortic stenosis  with a mean gradient of 29 mm Hg, peak gradient of 45 mm Hg, and valve area of 0.41 cm^2. There was also mild to moderate mitral regurgitation. Left atrium was moderately dilated at 50 mm. Mild right atrial dilatation. A left pleural effusion was noted. Study could not estimate right-sided pressure. Prior to this study, his most recent echo was on 12/13/2010 that showed and EF of 123456, diastolic dysfunction, mild concentric LVH, RV systolic function was normal, mild to moderate aortic sclerosis was noted without hemodynamically significant stenosis. No prior ischemic evaluations.   He is feeling well today and comes in with a cane. His LE edema has improved significantly on Lasix 20 mg daily per his report. He does continue to be SOB. His son reports this SOB has been present for at least 12 months and is not worse at this time. Weight stable. No orthopnea, cough, or early satiety. He is tolerating medications without issues though does not like taking Lasix as it makes him void 3 times nightly rather than his baseline 2. He does not wear compression hose. No dizziness, presyncope, or syncope. No chest pain.    Past Medical History:  Diagnosis Date  . BPH (benign prostatic hyperplasia)   . Carotid arterial disease ([place])    a. 2015: s/p R CEA.  . Chronic diastolic CHF (congestive heart failure) ([place])    a. echo 11/2015: EF 55-60%, possible HK of inf wall, severe AS mean gradient 29, peak gradient 45, valve area 0.41, mild to mod MR,  LA dilated @ 50 mild RA dilatation, left pleural effusion  . Chronic low back pain   . CKD (chronic kidney disease), stage II   . Essential hypertension   . Falls   . Gout   . HLD (hyperlipidemia)   . Hypothyroidism   . Lower extremity neuropathy   . PAF (paroxysmal atrial fibrillation) ([place])    a. 11/2010 Echo: EF >55%, diast dysfxn, mild conc lvh.  . Severe aortic stenosis    a. echo 11/2015 w/ mean gradient 29 mmHg, peak gradient 45 mmHg, valve area 0.41 cm^2  .  Squamous cell carcinoma of skin    a. x3 s/p local excision followed by more extensive surgery due to lymphadenopathy on the right.    Past Surgical History:  Procedure Laterality Date  . CARDIAC CATHETERIZATION  04-08-2005  . KNEE SURGERY  02-09-2007   replacement  . LAMINECTOMY AND MICRODISCECTOMY LUMBAR SPINE      Bilateral  at L4-5 followed by L4-5 right microdiskectomy.    Current Outpatient Prescriptions  Medication Sig Dispense Refill  . cloNIDine (CATAPRES) 0.3 MG tablet Take 0.3 mg by mouth 2 (two) times daily.    . furosemide (LASIX) 20 MG tablet Take 1 tablet (20 mg total) by mouth daily. 30 tablet 0  . hydrochlorothiazide (HYDRODIURIL) 25 MG tablet TAKE 1 TABLET (25 MG TOTAL) BY MOUTH DAILY. 90 tablet 3  . metoprolol succinate (TOPROL-XL) 100 MG 24 hr tablet Take 1 tablet (100 mg total) by mouth daily. Take with or immediately following a meal. 90 tablet 3  . Rivaroxaban (XARELTO) 20 MG TABS tablet Take 1 tablet (20 mg total) by mouth daily with supper. 90 tablet 3  . simvastatin (ZOCOR) 20 MG tablet Take 1 tablet (20 mg total) by mouth at bedtime. 90 tablet 2  . SYNTHROID 100 MCG tablet TAKE 1 TABLET DAILY 90 tablet 3  . terazosin (HYTRIN) 2 MG capsule Take 1 capsule (2 mg total) by mouth at bedtime. 90 capsule 2  . traMADol (ULTRAM) 50 MG tablet Take 1 tablet (50 mg total) by mouth every 8 (eight) hours as needed. 30 tablet 0   No current facility-administered medications for this visit.     Allergies:   Review of patient's allergies indicates no known allergies.   Social History:  The patient  reports that he has never smoked. He has never used smokeless tobacco. He reports that he does not drink alcohol or use drugs.   Family History:  The patient's family history includes Hypertension in his mother; Stroke in his mother.  ROS:   Review of Systems  Constitutional: Positive for malaise/fatigue. Negative for chills, diaphoresis, fever and weight loss.  HENT:  Negative for congestion.   Eyes: Negative for discharge and redness.  Respiratory: Positive for shortness of breath. Negative for cough, hemoptysis, sputum production and wheezing.   Cardiovascular: Positive for leg swelling. Negative for chest pain, palpitations, orthopnea, claudication and PND.  Gastrointestinal: Negative for abdominal pain, blood in stool, heartburn, melena, nausea and vomiting.  Genitourinary: Negative for hematuria.  Musculoskeletal: Negative for falls and myalgias.  Skin: Negative for rash.  Neurological: Positive for weakness. Negative for dizziness, tingling, tremors, sensory change, speech change, focal weakness and loss of consciousness.  Endo/Heme/Allergies: Does not bruise/bleed easily.  Psychiatric/Behavioral: Negative for substance abuse. The patient is not nervous/anxious.   All other systems reviewed and are negative.    PHYSICAL EXAM:  VS:  BP 122/80 (BP Location: Left Arm, Patient Position: Sitting, Cuff Size:  Normal)   Pulse 69   Ht 5' 6.5" (1.689 m)   Wt 166 lb (75.3 kg)   BMI 26.39 kg/m  BMI: Body mass index is 26.39 kg/m.  Physical Exam  Constitutional: He is oriented to person, place, and time. He appears well-developed and well-nourished.  HENT:  Head: Normocephalic and atraumatic.  Eyes: Right eye exhibits no discharge. Left eye exhibits no discharge.  Neck: Normal range of motion. No JVD present.  Cardiovascular: Normal rate, S1 normal and S2 normal.  An irregularly irregular rhythm present. Exam reveals no distant heart sounds, no friction rub, no midsystolic click and no opening snap.   Murmur heard.  Holosystolic murmur is present  at the upper right sternal border radiating to the neck Pulmonary/Chest: Effort normal and breath sounds normal. No respiratory distress. He has no decreased breath sounds. He has no wheezes. He has no rales. He exhibits no tenderness.  Abdominal: Soft. He exhibits no distension. There is no tenderness.    Musculoskeletal: He exhibits edema.  1+ pitting pre-tibial edema of the left leg and trace pre-tibial edema of the right leg  Neurological: He is alert and oriented to person, place, and time.  Skin: Skin is warm and dry. No cyanosis. Nails show no clubbing.  Psychiatric: He has a normal mood and affect. His speech is normal and behavior is normal. Judgment and thought content normal.     EKG:  Was ordered and interpreted by me today. Shows Afib, 69 bpm, TWI leads I, II, aVF, V4-V6 (old)  Recent Labs: 06/01/2015: Hemoglobin 12.7; Platelets 211 07/19/2015: TSH 1.76 12/06/2015: ALT 6; BUN 33; Creatinine, Ser 1.21; Potassium 4.1; Pro B Natriuretic peptide (BNP) 2,175.0; Sodium 137  06/04/2015: Cholesterol 95; HDL 30.20; LDL Cholesterol 51; Total CHOL/HDL Ratio 3; Triglycerides 70.0; VLDL 14.0   Estimated Creatinine Clearance: 38.1 mL/min (by C-G formula based on SCr of 1.21 mg/dL).   Wt Readings from Last 3 Encounters:  12/19/15 166 lb (75.3 kg)  12/11/15 165 lb 6 oz (75 kg)  12/06/15 172 lb 4 oz (78.1 kg)     Other studies reviewed: Additional studies/records reviewed today include: summarized above  ASSESSMENT AND PLAN:  1. LE swelling/acute on chronic diastolic CHF/SOB: Improved on Lasix 20 mg daily. Increase Lasix to 40 mg daily with KCl repletion. Recheck bnp and bmet today without planned recheck of bmet again the following week after diuresis. Less likely to be DVT given patient reports not missing any doses of Xarelto. Will check stat D-dimer, if positive he will need to go to the ER for LE doppler and CTA chest vs VQ scan. Compression hose advised, though he does not want to wear these. There is question if an increase in Afib burden has led to atrial stretch and elevation in his BNP vs solely diastolic CHF. Will have patient wear outpatient cardiac monitoring to evaluate for increased Afib burden. He agrees to wear a Holter monitor for 48 hours. His SOB is likely multifactorial  including acute on chronic diastolic CHF which will be addressed as above, possible increased Afib burden which will be addressed if seen on outpatient cardiac monitoring, and moderate to severe aortic stenosis.   2. Moderate to severe aortic stenosis: If his symptoms persist with improvement in volume status (must be cautious given his AS) as well as possible increased Afib burden then the next step would be to have patient undergo a TEE to further evaluate his AS. He does not wish to undergo this at this  time. Furthermore, should his TEE indicate severe AS he is not certain he would want to undergo cardiac cath as part of the work up for potential valve replacement, nor is he certain he would even want a valve replacement. Due to these hesitations we will address the above and see how he feels moving forward prior to undergoing invasive procedures.   3. PAF: Currently, in rate-controlled Afib. Continue current medications. Has not missed any doses of Xarelto. CHADS2VASc 5 (CHF, HTN, age x 2, vascular disease). 48-hour Holter monitor as above.   4. CKD stage II: Stable.   5. HTN: Well controlled. Continue current medications.   6. HLD: Simvastatin 20 mg. LDL 51 as of 06/04/2015. Followed by PCP.   Disposition: F/u with Dr. Rockey Situ after the above results are back.    Current medicines are reviewed at length with the patient today.  The patient did not have any concerns regarding medicines.  Melvern Banker PA-C 12/19/2015 2:41 PM     [address], [place] [address] [phone]

## 2015-12-19 NOTE — ED Triage Notes (Signed)
Pt sent over per cardiologist for further eval of possible blood clot in lungs. Pt denies any pain at this time. Pt with hx of afib.

## 2015-12-19 NOTE — Patient Instructions (Addendum)
Medication Instructions:  Please increase your Lasix to 40 mg once daily for 4 days,  then go back to regular dose of 20 mg once daily  Labwork: Please take orders to the [facility] for  STAT d-dimer, BNP, BMET  Testing/Procedures: Your physician has recommended that you wear a holter monitor. Holter monitors are medical devices that record the heart's electrical activity. Doctors most often use these monitors to diagnose arrhythmias. Arrhythmias are problems with the speed or rhythm of the heartbeat. The monitor is a small, portable device. You can wear one while you do your normal daily activities. This is usually used to diagnose what is causing palpitations/syncope (passing out).  Follow-Up: 1 week (after holter)  If you need a refill on your cardiac medications before your next appointment, please call your pharmacy.   Holter Monitoring A Holter monitor is a small device that is used to detect abnormal heart rhythms. It clips to your clothing and is connected by wires to flat, sticky disks (electrodes) that attach to your chest. It is worn continuously for 24-48 hours. HOME CARE INSTRUCTIONS  Wear your Holter monitor at all times, even while exercising and sleeping, for as long as directed by your health care provider.  Make sure that the Holter monitor is safely clipped to your clothing or close to your body as recommended by your health care provider.  Do not get the monitor or wires wet.  Do not put body lotion or moisturizer on your chest.  Keep your skin clean.  Keep a diary of your daily activities, such as walking and doing chores. If you feel that your heartbeat is abnormal or that your heart is fluttering or skipping a beat:  Record what you are doing when it happens.  Record what time of day the symptoms occur.  Return your Holter monitor as directed by your health care provider.  Keep all follow-up visits as directed by your health care provider. This is  important. SEEK IMMEDIATE MEDICAL CARE IF:  You feel lightheaded or you faint.  You have trouble breathing.  You feel pain in your chest, upper arm, or jaw.  You feel sick to your stomach and your skin is pale, cool, or damp.  You heartbeat feels unusual or abnormal.   This information is not intended to replace advice given to you by your health care provider. Make sure you discuss any questions you have with your health care provider.   Document Released: 01/04/2004 Document Revised: 04/28/2014 Document Reviewed: 11/14/2013 Elsevier Interactive Patient Education [employer].

## 2015-12-19 NOTE — ED Notes (Signed)
Discharge instructions reviewed with patient. Questions fielded by this RN. Patient verbalizes understanding of instructions. Patient discharged home in stable condition per Stafford MD . No acute distress noted at time of discharge.  

## 2015-12-26 ENCOUNTER — Ambulatory Visit (INDEPENDENT_AMBULATORY_CARE_PROVIDER_SITE_OTHER): Payer: Medicare Other

## 2015-12-26 DIAGNOSIS — M7989 Other specified soft tissue disorders: Secondary | ICD-10-CM

## 2015-12-26 DIAGNOSIS — I5032 Chronic diastolic (congestive) heart failure: Secondary | ICD-10-CM

## 2015-12-26 DIAGNOSIS — W19XXXS Unspecified fall, sequela: Secondary | ICD-10-CM

## 2015-12-26 DIAGNOSIS — I5033 Acute on chronic diastolic (congestive) heart failure: Secondary | ICD-10-CM

## 2015-12-26 DIAGNOSIS — G5790 Unspecified mononeuropathy of unspecified lower limb: Secondary | ICD-10-CM

## 2015-12-26 DIAGNOSIS — I739 Peripheral vascular disease, unspecified: Secondary | ICD-10-CM

## 2015-12-26 DIAGNOSIS — N182 Chronic kidney disease, stage 2 (mild): Secondary | ICD-10-CM | POA: Diagnosis not present

## 2015-12-26 DIAGNOSIS — I350 Nonrheumatic aortic (valve) stenosis: Secondary | ICD-10-CM | POA: Diagnosis not present

## 2015-12-26 DIAGNOSIS — I10 Essential (primary) hypertension: Secondary | ICD-10-CM

## 2015-12-26 DIAGNOSIS — I480 Paroxysmal atrial fibrillation: Secondary | ICD-10-CM

## 2015-12-26 DIAGNOSIS — I779 Disorder of arteries and arterioles, unspecified: Secondary | ICD-10-CM

## 2015-12-26 DIAGNOSIS — E785 Hyperlipidemia, unspecified: Secondary | ICD-10-CM

## 2015-12-31 ENCOUNTER — Other Ambulatory Visit: Payer: Self-pay | Admitting: Family Medicine

## 2016-01-01 NOTE — Progress Notes (Deleted)
Cardiology Office Note Date:  01/01/2016  Patient ID:  Jason Wade, DOB 1924/01/05, MRN [id number] PCP:  Coral Spikes, DO  Cardiologist:  Dr. Rockey Situ, MD  ***refresh   Chief Complaint: Follow up  History of Present Illness: Jason Wade is a 82 y.o. male with history of PAF on Xarelto, chronic diastolic CHF, severe aortic stenosis, long standing LE edema for greater than 16 years noncompliant with compression stockings, squamous cell cancer s/p resection and radiation, right arm lymphedema 2/2 lymph node resection, carotid artery disease s/p carotid enarterectomy 05/2013, history of falls, LE neuropathy, CKD stage II, HTN, HLD, and hypothyroidism who presents for routine follow up of his PAF, aortic stenosis and LE edema.   He was most recently seen by Dr. Rockey Situ on 11/12/15 for routine follow up of his Afib, and was noted to be in rate-controlled Afib. At that time there was some confusion regarding his medications. He had previously been advised to stop diltiazem in April 2017, though he did not. In July, he was noted to have continued pitting edema of his LE. It was felt his diltiazem may have been playing a role in the swelling and was discontinued. He stopped the diltiazem in July with some reported improvement in his LE swelling. Unfortunately, he twisted his right ankle and noted increase in LE swelling up to the knee. He has been seeing his PCP for the ankle and swelling. BNP was checked on 12/06/15 and found to be 2100. He was started on low-dose Lasix 20 mg daily without improvement in his swelling. LE doppler to evaluate for DVT was not felt to be indicated given Xarelto usage at that time. Plain film of the left ankle was negative for fracture, though did show vascular calcification c/w PVD. He has continued to have significant pain along his left LE. Never with any SOB or chest pain. Echo was performed on 12/14/15 and showed an EF of 55-60%, possible HK of the inferior myocardium,  severe aortic stenosis with a mean gradient of 29 mm Hg, peak gradient of 45 mm Hg, and valve area of 0.41 cm^2. There was also mild to moderate mitral regurgitation. Left atrium was moderately dilated at 50 mm. Mild right atrial dilatation. A left pleural effusion was noted. Study could not estimate right-sided pressure. Prior to this study, his most recent echo was on 12/13/2010 that showed and EF of 123456, diastolic dysfunction, mild concentric LVH, RV systolic function was normal, mild to moderate aortic sclerosis was noted without hemodynamically significant stenosis. No prior ischemic evaluations. He was seen by me on 12/19/15 for evaluation of his LE swelling as well as his above echo. At that time he was feeling well. His LE edema has improved significantly on Lasix 20 mg daily per his report.His son reported SOB that had been present for at least 12 months and was not worse at that time. Weight stable. He was not wearing compression hose, nor was he taking his Lasix as directed. Labs from that day showed an elevated D-dimer. He was sent to the ED, underwent CTA chest that was negative for PE. Follow up BNP remained elevated at 1,970. Lasix was increased. He underwent 48-hour Holter to evaluate for increaed Afib with RVR burden that showed ***. His aoritc stenosis was discussed with Dr. Rockey Situ who felt it was more moderate than severe and continued medical managment was advised.    Past Medical History:  Diagnosis Date  . BPH (benign prostatic hyperplasia)   .  Carotid arterial disease ([place])    a. 2015: s/p R CEA.  . Chronic diastolic CHF (congestive heart failure) ([place])    a. echo 11/2015: EF 55-60%, possible HK of inf wall, severe AS mean gradient 29, peak gradient 45, valve area 0.41, mild to mod MR, LA dilated @ 50 mild RA dilatation, left pleural effusion  . Chronic low back pain   . CKD (chronic kidney disease), stage II   . Essential hypertension   . Falls   . Gout   . HLD (hyperlipidemia)     . Hypothyroidism   . Lower extremity neuropathy   . PAF (paroxysmal atrial fibrillation) ([place])    a. 11/2010 Echo: EF >55%, diast dysfxn, mild conc lvh.  . Severe aortic stenosis    a. echo 11/2015 w/ mean gradient 29 mmHg, peak gradient 45 mmHg, valve area 0.41 cm^2  . Squamous cell carcinoma of skin    a. x3 s/p local excision followed by more extensive surgery due to lymphadenopathy on the right.    Past Surgical History:  Procedure Laterality Date  . CARDIAC CATHETERIZATION  04-08-2005  . KNEE SURGERY  02-09-2007   replacement  . LAMINECTOMY AND MICRODISCECTOMY LUMBAR SPINE      Bilateral  at L4-5 followed by L4-5 right microdiskectomy.    Current Outpatient Prescriptions  Medication Sig Dispense Refill  . cloNIDine (CATAPRES) 0.3 MG tablet Take 0.3 mg by mouth 2 (two) times daily.    . furosemide (LASIX) 20 MG tablet TAKE 1 TABLET (20 MG TOTAL) BY MOUTH DAILY. 30 tablet 0  . hydrochlorothiazide (HYDRODIURIL) 25 MG tablet TAKE 1 TABLET (25 MG TOTAL) BY MOUTH DAILY. 90 tablet 3  . metoprolol succinate (TOPROL-XL) 100 MG 24 hr tablet Take 1 tablet (100 mg total) by mouth daily. Take with or immediately following a meal. 90 tablet 3  . Rivaroxaban (XARELTO) 20 MG TABS tablet Take 1 tablet (20 mg total) by mouth daily with supper. 90 tablet 3  . simvastatin (ZOCOR) 20 MG tablet Take 1 tablet (20 mg total) by mouth at bedtime. 90 tablet 2  . SYNTHROID 100 MCG tablet TAKE 1 TABLET DAILY 90 tablet 3  . terazosin (HYTRIN) 2 MG capsule Take 1 capsule (2 mg total) by mouth at bedtime. 90 capsule 2  . traMADol (ULTRAM) 50 MG tablet Take 1 tablet (50 mg total) by mouth every 8 (eight) hours as needed. 30 tablet 0   No current facility-administered medications for this visit.     Allergies:   Review of patient's allergies indicates no known allergies.   Social History:  The patient  reports that he has never smoked. He has never used smokeless tobacco. He reports that he does not drink  alcohol or use drugs.   Family History:  The patient's family history includes Hypertension in his mother; Stroke in his mother.  ROS:   ROS   PHYSICAL EXAM: *** VS:  There were no vitals taken for this visit. BMI: There is no height or weight on file to calculate BMI.  Physical Exam   EKG:  Was ordered and interpreted by me today. Shows ***  Recent Labs: 07/19/2015: TSH 1.76 12/06/2015: Pro B Natriuretic peptide (BNP) 2,175.0 12/19/2015: ALT 13; B Natriuretic Peptide 1,970.0; BUN 40; Creatinine, Ser 1.32; Hemoglobin 13.1; Platelets 242; Potassium 3.6; Sodium 136  06/04/2015: Cholesterol 95; HDL 30.20; LDL Cholesterol 51; Total CHOL/HDL Ratio 3; Triglycerides 70.0; VLDL 14.0   Estimated Creatinine Clearance: 34.2 mL/min (by C-G formula based on  SCr of 1.32 mg/dL).   Wt Readings from Last 3 Encounters:  12/19/15 167 lb (75.8 kg)  12/19/15 166 lb (75.3 kg)  12/11/15 165 lb 6 oz (75 kg)     Other studies reviewed: Additional studies/records reviewed today include: summarized above  ASSESSMENT AND PLAN:  1. ***  Disposition: F/u with *** in   Current medicines are reviewed at length with the patient today.  The patient did not have any concerns regarding medicines.  Melvern Banker PA-C 01/01/2016 5:11 PM     [address] [place], [place] [address] [phone]

## 2016-01-02 ENCOUNTER — Ambulatory Visit: Payer: Medicare Other | Admitting: Physician Assistant

## 2016-01-03 NOTE — Progress Notes (Signed)
Cardiology Office Note Date:  01/04/2016  Patient ID:  Jason Wade, DOB 1924/01/31, MRN [id number] PCP:  Coral Spikes, DO  Cardiologist:  Dr. Rockey Situ, MD    Chief Complaint: Follow up [name]  History of Present Illness: Jason Wade is a 82 y.o. male with history of persistent Afib on Xarelto, chronic diastolic CHF, severe aortic stenosis, long standing LE edema for greater than 16 years noncompliant with compression stockings, squamous cell cancer s/p resection and radiation, right arm lymphedema 2/2 lymph node resection, carotid artery disease s/p carotid enarterectomy 05/2013, history of falls, LE neuropathy, CKD stage II, HTN, HLD, and hypothyroidism who presents for routine follow up of his PAF, aortic stenosis and LE edema.   He was most recently seen by Dr. Rockey Situ on 11/12/15 for routine follow up of his Afib, and was noted to be in rate-controlled Afib. At that time there was some confusion regarding his medications. He had previously been advised to stop diltiazem in April 2017, though he did not. In July, he was noted to have continued pitting edema of his LE. It was felt his diltiazem may have been playing a role in the swelling and was discontinued. He stopped the diltiazem in July with some reported improvement in his LE swelling. Unfortunately, he twisted his right ankle and noted increase in LE swelling up to the knee. He has been seeing his PCP for the ankle and swelling. BNP was checked on 12/06/15 and found to be 2100. He was started on low-dose Lasix 20 mg daily without improvement in his swelling. LE doppler to evaluate for DVT was not felt to be indicated given Xarelto usage at that time. Plain film of the left ankle was negative for fracture, though did show vascular calcification c/w PVD. He has continued to have significant pain along his left LE. Never with any SOB or chest pain. Echo was performed on 12/14/15 and showed an EF of 55-60%, possible HK of the inferior  myocardium, severe aortic stenosis with a mean gradient of 29 mm Hg, peak gradient of 45 mm Hg, and valve area of 0.41 cm^2. There was also mild to moderate mitral regurgitation. Left atrium was moderately dilated at 50 mm. Mild right atrial dilatation. A left pleural effusion was noted. Study could not estimate right-sided pressure. Prior to this study, his most recent echo was on 12/13/2010 that showed and EF of 123456, diastolic dysfunction, mild concentric LVH, RV systolic function was normal, mild to moderate aortic sclerosis was noted without hemodynamically significant stenosis. No prior ischemic evaluations. He was seen by me on 12/19/15 for evaluation of his LE swelling as well as his above echo. At that time he was feeling well. His LE edema has improved significantly on Lasix 20 mg daily per his report. His son reported SOB that had been present for at least 12 months and was not worse at that time. Weight stable. He was not wearing compression hose, nor was he taking his Lasix as directed. Labs from that day showed an elevated D-dimer. He was sent to the ED, underwent CTA chest that was negative for PE. Follow up BNP remained elevated at 1,970. Lasix was increased. He underwent 48-hour Holter to evaluate for increaed Afib with RVR burden that is pending at this time. His aoritc stenosis was discussed with Dr. Rockey Situ who felt it was more moderate than severe and continued medical managment was advised.   He comes in today overall feeling better and breathing better,  though with some continued SOB with mild exertion. No chest pain. LE swelling has resolved. Tolerating Lasix 20 mg daily without issues. Has lost 7 pounds since he was seen at the end of August. Appetite improved. No dizziness, presyncope, or syncope.    Past Medical History:  Diagnosis Date  . BPH (benign prostatic hyperplasia)   . Carotid arterial disease ([place])    a. 2015: s/p R CEA.  . Chronic diastolic CHF (congestive heart failure)  ([place])    a. echo 11/2015: EF 55-60%, possible HK of inf wall, severe AS mean gradient 29, peak gradient 45, valve area 0.41, mild to mod MR, LA dilated @ 50 mild RA dilatation, left pleural effusion  . Chronic low back pain   . CKD (chronic kidney disease), stage II   . Essential hypertension   . Falls   . Gout   . HLD (hyperlipidemia)   . Hypothyroidism   . Lower extremity neuropathy   . PAF (paroxysmal atrial fibrillation) ([place])    a. 11/2010 Echo: EF >55%, diast dysfxn, mild conc lvh.  . Severe aortic stenosis    a. echo 11/2015 w/ mean gradient 29 mmHg, peak gradient 45 mmHg, valve area 0.41 cm^2  . Squamous cell carcinoma of skin    a. x3 s/p local excision followed by more extensive surgery due to lymphadenopathy on the right.    Past Surgical History:  Procedure Laterality Date  . CARDIAC CATHETERIZATION  04-08-2005  . KNEE SURGERY  02-09-2007   replacement  . LAMINECTOMY AND MICRODISCECTOMY LUMBAR SPINE      Bilateral  at L4-5 followed by L4-5 right microdiskectomy.    Current Outpatient Prescriptions  Medication Sig Dispense Refill  . cloNIDine (CATAPRES) 0.3 MG tablet Take 0.3 mg by mouth 2 (two) times daily.    . furosemide (LASIX) 20 MG tablet TAKE 1 TABLET (20 MG TOTAL) BY MOUTH DAILY. 30 tablet 0  . hydrochlorothiazide (HYDRODIURIL) 25 MG tablet TAKE 1 TABLET (25 MG TOTAL) BY MOUTH DAILY. 90 tablet 3  . metoprolol succinate (TOPROL-XL) 100 MG 24 hr tablet Take 1 tablet (100 mg total) by mouth daily. Take with or immediately following a meal. 90 tablet 3  . Rivaroxaban (XARELTO) 20 MG TABS tablet Take 1 tablet (20 mg total) by mouth daily with supper. 90 tablet 3  . simvastatin (ZOCOR) 20 MG tablet Take 1 tablet (20 mg total) by mouth at bedtime. 90 tablet 2  . SYNTHROID 100 MCG tablet TAKE 1 TABLET DAILY 90 tablet 3  . terazosin (HYTRIN) 2 MG capsule Take 1 capsule (2 mg total) by mouth at bedtime. 90 capsule 2  . traMADol (ULTRAM) 50 MG tablet Take 1 tablet (50 mg  total) by mouth every 8 (eight) hours as needed. 30 tablet 0   No current facility-administered medications for this visit.     Allergies:   Review of patient's allergies indicates no known allergies.   Social History:  The patient  reports that he has never smoked. He has never used smokeless tobacco. He reports that he does not drink alcohol or use drugs.   Family History:  The patient's family history includes Hypertension in his mother; Stroke in his mother.  ROS:   Review of Systems  Constitutional: Positive for malaise/fatigue. Negative for chills, diaphoresis, fever and weight loss.  HENT: Negative for congestion.   Eyes: Negative for discharge and redness.  Respiratory: Positive for shortness of breath. Negative for cough, sputum production and wheezing.   Cardiovascular: Negative  for chest pain, palpitations, orthopnea, claudication, leg swelling and PND.  Gastrointestinal: Negative for abdominal pain, heartburn, nausea and vomiting.  Musculoskeletal: Negative for falls and myalgias.  Skin: Negative for rash.  Neurological: Positive for weakness. Negative for dizziness, tingling, tremors, sensory change, speech change, focal weakness and loss of consciousness.  Endo/Heme/Allergies: Does not bruise/bleed easily.  Psychiatric/Behavioral: Negative for substance abuse. The patient is not nervous/anxious.   All other systems reviewed and are negative.    PHYSICAL EXAM:  VS:  BP 120/68 (BP Location: Left Arm, Patient Position: Sitting, Cuff Size: Normal)   Pulse 64   Ht 5\' 5"  (1.651 m)   Wt 160 lb 8 oz (72.8 kg)   BMI 26.71 kg/m  BMI: Body mass index is 26.71 kg/m.  Physical Exam  Constitutional: He is oriented to person, place, and time. He appears well-developed and well-nourished.  HENT:  Head: Normocephalic and atraumatic.  Eyes: Right eye exhibits no discharge. Left eye exhibits no discharge.  Neck: Normal range of motion. No JVD present.  Cardiovascular: Normal  rate, S1 normal and S2 normal.  An irregularly irregular rhythm present. Exam reveals no distant heart sounds, no friction rub, no midsystolic click and no opening snap.   Murmur heard.  Harsh midsystolic murmur is present with a grade of 3/6  at the upper right sternal border radiating to the neck Pulmonary/Chest: Effort normal and breath sounds normal. No respiratory distress. He has no decreased breath sounds. He has no wheezes. He has no rales. He exhibits no tenderness.  Abdominal: Soft. He exhibits no distension. There is no tenderness.  Musculoskeletal: He exhibits edema.  Trace bilateral ankle edema.   Neurological: He is alert and oriented to person, place, and time.  Skin: Skin is warm and dry. No cyanosis. Nails show no clubbing.  Psychiatric: He has a normal mood and affect. His speech is normal and behavior is normal. Judgment and thought content normal.      EKG:  Was ordered and interpreted by me today. Shows Afib, 66 bpm, TWI leads I, aVL, V4-V6  Recent Labs: 07/19/2015: TSH 1.76 12/06/2015: Pro B Natriuretic peptide (BNP) 2,175.0 12/19/2015: ALT 13; B Natriuretic Peptide 1,970.0; BUN 40; Creatinine, Ser 1.32; Hemoglobin 13.1; Platelets 242; Potassium 3.6; Sodium 136  06/04/2015: Cholesterol 95; HDL 30.20; LDL Cholesterol 51; Total CHOL/HDL Ratio 3; Triglycerides 70.0; VLDL 14.0   Estimated Creatinine Clearance: 33 mL/min (by C-G formula based on SCr of 1.32 mg/dL (H)).   Wt Readings from Last 3 Encounters:  01/04/16 160 lb 8 oz (72.8 kg)  12/19/15 167 lb (75.8 kg)  12/19/15 166 lb (75.3 kg)     Other studies reviewed: Additional studies/records reviewed today include: summarized above  ASSESSMENT AND PLAN:  1. Acute on chronic diastolic CHF: Much improved. He really only appears to be mildly volume overloaded on exam today. Will check a bmet and bnp to trend. For now, continue Lasix 20 mg daily until we see what his labs show. Further recommendations to be made at that  time. CHF education. Compression stockings are advised. Given his continued, though improved SOB will schedule a Lexiscan Myoview to evaluate for high-risk ischemia. He is unable to treadmill 2/2 fatigue.  2. Moderate to severe aortic stenosis: If his symptoms persist with improvement in volume status (must be cautious given his AS) as well as possible increased Afib burden as below then the next step would be to have patient undergo a TEE to further evaluate his AS. He does not  wish to undergo this at this time. Furthermore, should his TEE indicate severe AS he is not certain he would want to undergo cardiac cath as part of the work up for potential valve replacement. At this time he reports he would not want to undergo any type of valve replacement. Due to these hesitations we will address the above and see how he feels moving forward prior to undergoing invasive procedures 3. Persistent Afib: Rate controlled. Continue current medications. Holter monitor is pending though suspect this will show Afib. Clinical question is how well rate controlled is he? If he is well rate controlled consider leaving him in Afib, if not, would consider pursuing conversion to NSR (he does not want a DCCV).  4. CKD stage II: Check bmet.  5. HTN: Well controlled. Continue current medications.   Disposition: F/u with Dr. Rockey Situ in 1 month.   Current medicines are reviewed at length with the patient today.  The patient did not have any concerns regarding medicines.  Melvern Banker PA-C 01/04/2016 3:34 PM     [address] [place], [place] [address] [phone]

## 2016-01-04 ENCOUNTER — Encounter: Payer: Self-pay | Admitting: Physician Assistant

## 2016-01-04 ENCOUNTER — Ambulatory Visit (INDEPENDENT_AMBULATORY_CARE_PROVIDER_SITE_OTHER): Payer: Medicare Other | Admitting: Physician Assistant

## 2016-01-04 VITALS — BP 120/68 | HR 64 | Ht 65.0 in | Wt 160.5 lb

## 2016-01-04 DIAGNOSIS — I5033 Acute on chronic diastolic (congestive) heart failure: Secondary | ICD-10-CM

## 2016-01-04 DIAGNOSIS — I350 Nonrheumatic aortic (valve) stenosis: Secondary | ICD-10-CM

## 2016-01-04 DIAGNOSIS — I480 Paroxysmal atrial fibrillation: Secondary | ICD-10-CM | POA: Diagnosis not present

## 2016-01-04 DIAGNOSIS — R0602 Shortness of breath: Secondary | ICD-10-CM

## 2016-01-04 DIAGNOSIS — N182 Chronic kidney disease, stage 2 (mild): Secondary | ICD-10-CM

## 2016-01-04 DIAGNOSIS — I481 Persistent atrial fibrillation: Secondary | ICD-10-CM

## 2016-01-04 DIAGNOSIS — I10 Essential (primary) hypertension: Secondary | ICD-10-CM

## 2016-01-04 DIAGNOSIS — I4819 Other persistent atrial fibrillation: Secondary | ICD-10-CM

## 2016-01-04 NOTE — Patient Instructions (Addendum)
Medication Instructions: - Your physician recommends that you continue on your current medications as directed. Please refer to the Current Medication list given to you today.  Labwork: - Your physician recommends that you have lab work today: BMP/ BNP  Procedures/Testing: - Your physician has requested that you have a lexiscan myoview. For further information please visit [email or web address]. Please follow instruction sheet, as given.  Follow-Up: - Your physician recommends that you schedule a follow-up appointment in: 1 month with Dr. Rockey Situ  Any Additional Special Instructions Will Be Listed Below (If Applicable).  [place]  Your caregiver has ordered a Stress Test with nuclear imaging. The purpose of this test is to evaluate the blood supply to your heart muscle. This procedure is referred to as a "Non-Invasive Stress Test." This is because other than having an IV started in your vein, nothing is inserted or "invades" your body. Cardiac stress tests are done to find areas of poor blood flow to the heart by determining the extent of coronary artery disease (CAD). Some patients exercise on a treadmill, which naturally increases the blood flow to your heart, while others who are  unable to walk on a treadmill due to physical limitations have a pharmacologic/chemical stress agent called Lexiscan . This medicine will mimic walking on a treadmill by temporarily increasing your coronary blood flow.   Please note: these test may take anywhere between 2-4 hours to complete  PLEASE REPORT TO [facility]  THE VOLUNTEERS AT THE FIRST DESK WILL DIRECT YOU WHERE TO GO  Date of Procedure:________Friday 9/29/17_____________________________  Arrival Time for Procedure:______7:45 am________________________  Instructions regarding medication:   ____ : Hold diabetes medication morning of procedure  _x___:  Hold other medications as follows: 1) hold metoprolol for 24 hours before  your test  2) hold lasix (furosemide) the morning of your test_________________________________________________________________________________________________________________________________________  PLEASE NOTIFY THE OFFICE AT LEAST 24 HOURS IN ADVANCE IF YOU ARE UNABLE TO KEEP YOUR APPOINTMENT.  [phone] AND  PLEASE NOTIFY NUCLEAR MEDICINE AT [facility] AT LEAST 24 HOURS IN ADVANCE IF YOU ARE UNABLE TO KEEP YOUR APPOINTMENT. [phone]  How to prepare for your Myoview test:  1. Do not eat or drink after midnight 2. No caffeine for 24 hours prior to test 3. No smoking 24 hours prior to test. 4. Your medication may be taken with water.  If your doctor stopped a medication because of this test, do not take that medication. 5. Ladies, please do not wear dresses.  Skirts or pants are appropriate. Please wear a short sleeve shirt. 6. No perfume, cologne or lotion. 7. Wear comfortable walking shoes. No heels!   If you need a refill on your cardiac medications before your next appointment, please call your pharmacy.

## 2016-01-05 LAB — BRAIN NATRIURETIC PEPTIDE: BNP: 1687.7 pg/mL — ABNORMAL HIGH (ref 0.0–100.0)

## 2016-01-05 LAB — BASIC METABOLIC PANEL
BUN/Creatinine Ratio: 30 — ABNORMAL HIGH (ref 10–24)
BUN: 44 mg/dL — ABNORMAL HIGH (ref 8–27)
CO2: 26 mmol/L (ref 18–29)
CREATININE: 1.45 mg/dL — AB (ref 0.76–1.27)
Calcium: 8.8 mg/dL (ref 8.6–10.2)
Chloride: 97 mmol/L (ref 96–106)
GFR, EST AFRICAN AMERICAN: 49 mL/min/{1.73_m2} — AB (ref >59)
GFR, EST NON AFRICAN AMERICAN: 42 mL/min/{1.73_m2} — AB (ref >59)
Glucose: 98 mg/dL (ref 65–99)
POTASSIUM: 3.6 mmol/L (ref 3.5–5.2)
SODIUM: 139 mmol/L (ref 134–144)

## 2016-01-07 ENCOUNTER — Other Ambulatory Visit: Payer: Self-pay

## 2016-01-07 ENCOUNTER — Ambulatory Visit
Admission: RE | Admit: 2016-01-07 | Discharge: 2016-01-07 | Disposition: A | Discharge status: Home / Self Care | Payer: Medicare Other | Source: Ambulatory Visit | Attending: Cardiovascular Disease | Admitting: Cardiovascular Disease

## 2016-01-07 DIAGNOSIS — I5032 Chronic diastolic (congestive) heart failure: Secondary | ICD-10-CM

## 2016-01-09 ENCOUNTER — Telehealth: Payer: Self-pay | Admitting: Physician Assistant

## 2016-01-09 ENCOUNTER — Other Ambulatory Visit
Admission: RE | Admit: 2016-01-09 | Discharge: 2016-01-09 | Disposition: A | Discharge status: Home / Self Care | Payer: Medicare Other | Source: Ambulatory Visit | Attending: Physician Assistant | Admitting: Physician Assistant

## 2016-01-09 DIAGNOSIS — C4492 Squamous cell carcinoma of skin, unspecified: Secondary | ICD-10-CM | POA: Diagnosis present

## 2016-01-09 DIAGNOSIS — I5032 Chronic diastolic (congestive) heart failure: Secondary | ICD-10-CM

## 2016-01-09 LAB — COMPREHENSIVE METABOLIC PANEL
ALBUMIN: 3.1 g/dL — AB (ref 3.5–5.0)
ALT: 12 U/L — ABNORMAL LOW (ref 17–63)
ANION GAP: 10 (ref 5–15)
AST: 27 U/L (ref 15–41)
Alkaline Phosphatase: 54 U/L (ref 38–126)
BILIRUBIN TOTAL: 0.8 mg/dL (ref 0.3–1.2)
BUN: 45 mg/dL — ABNORMAL HIGH (ref 6–20)
CO2: 25 mmol/L (ref 22–32)
Calcium: 8.8 mg/dL — ABNORMAL LOW (ref 8.9–10.3)
Chloride: 101 mmol/L (ref 101–111)
Creatinine, Ser: 1.41 mg/dL — ABNORMAL HIGH (ref 0.61–1.24)
GFR calc Af Amer: 49 mL/min — ABNORMAL LOW (ref >60)
GFR calc non Af Amer: 43 mL/min — ABNORMAL LOW (ref >60)
GLUCOSE: 102 mg/dL — AB (ref 65–99)
POTASSIUM: 3.9 mmol/L (ref 3.5–5.1)
SODIUM: 136 mmol/L (ref 135–145)
TOTAL PROTEIN: 6.4 g/dL — AB (ref 6.5–8.1)

## 2016-01-09 LAB — BRAIN NATRIURETIC PEPTIDE: B Natriuretic Peptide: 1763 pg/mL — ABNORMAL HIGH (ref 0.0–100.0)

## 2016-01-09 NOTE — Telephone Encounter (Signed)
Pt daughter calling stating [facility]  But was told to hold his lasic for 3 days  Not sure if they are to wait until results come in or go ahead and resume taking it Please advise

## 2016-01-09 NOTE — Telephone Encounter (Signed)
Please see result note 

## 2016-01-10 ENCOUNTER — Encounter: Payer: Self-pay | Admitting: Physician Assistant

## 2016-01-13 ENCOUNTER — Encounter: Payer: Self-pay | Admitting: Physician Assistant

## 2016-01-17 ENCOUNTER — Telehealth: Payer: Self-pay | Admitting: Cardiovascular Disease

## 2016-01-17 NOTE — Telephone Encounter (Signed)
Spoke w/ Jason Wade.  Reviewed pre-procedure instructions w/ her.  She is appreciative and will call back w/ any further questions or concerns.

## 2016-01-17 NOTE — Telephone Encounter (Signed)
Pt daughter calling asking if we can call her back to go over myoview prep for patient Please call back

## 2016-01-18 ENCOUNTER — Encounter
Admission: RE | Admit: 2016-01-18 | Discharge: 2016-01-18 | Disposition: A | Discharge status: Home / Self Care | Payer: Medicare Other | Source: Ambulatory Visit | Attending: Physician Assistant | Admitting: Physician Assistant

## 2016-01-18 DIAGNOSIS — R0602 Shortness of breath: Secondary | ICD-10-CM | POA: Insufficient documentation

## 2016-01-18 LAB — NM MYOCAR MULTI W/SPECT W/WALL MOTION / EF
CSEPHR: 58 %
CSEPPHR: 77 {beats}/min
LV sys vol: 29 mL
LVDIAVOL: 73 mL (ref 62–150)
Rest HR: 65 {beats}/min
SDS: 0
SRS: 9
SSS: 8
TID: 0.83

## 2016-01-18 MED ORDER — TECHNETIUM TC 99M TETROFOSMIN IV KIT
13.0000 | PACK | Freq: Once | INTRAVENOUS | Status: AC | PRN
  Administered 2016-01-18: 12.99 via INTRAVENOUS

## 2016-01-18 MED ORDER — TECHNETIUM TC 99M TETROFOSMIN IV KIT
31.2900 | PACK | Freq: Once | INTRAVENOUS | Status: AC | PRN
  Administered 2016-01-18: 31.29 via INTRAVENOUS

## 2016-01-18 MED ORDER — REGADENOSON 0.4 MG/5ML IV SOLN
0.4000 mg | Freq: Once | INTRAVENOUS | Status: AC
  Administered 2016-01-18: 0.4 mg via INTRAVENOUS

## 2016-02-07 ENCOUNTER — Encounter: Payer: Self-pay | Admitting: Cardiovascular Disease

## 2016-02-07 ENCOUNTER — Ambulatory Visit (INDEPENDENT_AMBULATORY_CARE_PROVIDER_SITE_OTHER): Payer: Medicare Other | Admitting: Cardiovascular Disease

## 2016-02-07 VITALS — BP 118/62 | HR 59 | Ht 65.0 in | Wt 170.8 lb

## 2016-02-07 DIAGNOSIS — E7800 Pure hypercholesterolemia, unspecified: Secondary | ICD-10-CM | POA: Diagnosis not present

## 2016-02-07 DIAGNOSIS — I480 Paroxysmal atrial fibrillation: Secondary | ICD-10-CM

## 2016-02-07 DIAGNOSIS — I5032 Chronic diastolic (congestive) heart failure: Secondary | ICD-10-CM | POA: Diagnosis not present

## 2016-02-07 DIAGNOSIS — I10 Essential (primary) hypertension: Secondary | ICD-10-CM | POA: Diagnosis not present

## 2016-02-07 MED ORDER — FUROSEMIDE 20 MG PO TABS: 20.0000 mg | ORAL_TABLET | Freq: Every day | ORAL | 3 refills | Status: DC | PRN

## 2016-02-07 NOTE — Patient Instructions (Addendum)
Medication Instructions:   Please restart lasix daily for 3 days Then go to every other day Monitor weight daily  Take extra lasix as needed for shortness of breath Bananas for potassium or pill only with lasix   Labwork:  No new labs needed  Testing/Procedures:  No further testing at this time   Follow-Up: It was a pleasure seeing you in the office today. Please call us if you have new issues that need to be addressed before your next appt.  [phone]  Your physician wants you to follow-up in: 6 months.  You will receive a reminder letter in the mail two months in advance. If you don't receive a letter, please call our office to schedule the follow-up appointment.  If you need a refill on your cardiac medications before your next appointment, please call your pharmacy.

## 2016-02-07 NOTE — Progress Notes (Signed)
Cardiology Office Note  Date:  02/07/2016   ID:  Jason Wade, DOB Dec 29, 1923, MRN [id number]  PCP:  Coral Spikes, DO   Chief Complaint  Patient presents with  . other    C/o sob and swelling/weight gain. Meds reviewed verbally with pt.    HPI:  Mr. Capra is a 82 year old gentleman with a history of squamous cell cancer, resection, recurrence with long course of XRT, s/p chemotherapy, aortic valve stenosis, paroxysmal atrial fibrillation/flutter who presents for routine follow-up of his atrial fibrillation  He presents today with several family members including his daughter  In follow-up, he reports that he is doing well though does have return of his shortness of breath. He does not follow his weight Reports feeling better when he was taking Lasix, he has since stopped the diuretic Mild swelling in his legs, not severe Leg swelling dramatically improved by holding calcium channel blocker Notes indicating 10 pound weight gain  Reports that he needs to drink lots of water daily secondary to his macular degeneration early statin is what he was told  Chronic Right arm lymphedema, had swelling, blistering  Still Goes to the gym, 2x per week wed/Fri Continues to walk periodically with a cane Denies any significant tachycardia  EKG on today's visit shows atrial fibrillation with ventricular rate 59 bpm, nonspecific ST and T wave abnormality V4 through V6, 1 and aVL  Other past medical history reviewed  He had carotid endarterectomy on the right in February 2015  History of lymphedema in his right arm and has compression therapy on the arm by regular basis. Previous surgery with lymph node resection on the right.   Previous fall in November 2013 as he walked at [employer]. He cut his head and needed several stitches. Since then his balance has been adequate though he uses a cane. He has a pinched nerve in his leg, chronic neuropathy.   echocardiogram done in the hospital  shows normal LV systolic function, diastolic dysfunction, mild LVH, normal right ventricular systolic pressures  PMH:   has a past medical history of BPH (benign prostatic hyperplasia); Carotid arterial disease ([place]); Chronic diastolic CHF (congestive heart failure) ([place]); Chronic low back pain; CKD (chronic kidney disease), stage II; Essential hypertension; Falls; Gout; HLD (hyperlipidemia); Hypothyroidism; Lower extremity neuropathy; PAF (paroxysmal atrial fibrillation) ([place]); Severe aortic stenosis; and Squamous cell carcinoma of skin.  PSH:    Past Surgical History:  Procedure Laterality Date  . CARDIAC CATHETERIZATION  04-08-2005  . KNEE SURGERY  02-09-2007   replacement  . LAMINECTOMY AND MICRODISCECTOMY LUMBAR SPINE      Bilateral  at L4-5 followed by L4-5 right microdiskectomy.    Current Outpatient Prescriptions  Medication Sig Dispense Refill  . cloNIDine (CATAPRES) 0.3 MG tablet Take 0.3 mg by mouth 2 (two) times daily.    . hydrochlorothiazide (HYDRODIURIL) 25 MG tablet TAKE 1 TABLET (25 MG TOTAL) BY MOUTH DAILY. 90 tablet 3  . metoprolol succinate (TOPROL-XL) 100 MG 24 hr tablet Take 1 tablet (100 mg total) by mouth daily. Take with or immediately following a meal. 90 tablet 3  . Rivaroxaban (XARELTO) 20 MG TABS tablet Take 1 tablet (20 mg total) by mouth daily with supper. 90 tablet 3  . simvastatin (ZOCOR) 20 MG tablet Take 1 tablet (20 mg total) by mouth at bedtime. 90 tablet 2  . SYNTHROID 100 MCG tablet TAKE 1 TABLET DAILY 90 tablet 3  . terazosin (HYTRIN) 2 MG capsule Take 1 capsule (2 mg total)  by mouth at bedtime. 90 capsule 2  . furosemide (LASIX) 20 MG tablet Take 1 tablet (20 mg total) by mouth daily as needed. 90 tablet 3   No current facility-administered medications for this visit.      Allergies:   Review of patient's allergies indicates no known allergies.   Social History:  The patient  reports that he has never smoked. He has never used smokeless tobacco.  He reports that he does not drink alcohol or use drugs.   Family History:   family history includes Hypertension in his mother; Stroke in his mother.    Review of Systems: Review of Systems  Constitutional: Negative.        Weight gain  Respiratory: Positive for shortness of breath.   Cardiovascular: Negative.   Gastrointestinal: Negative.   Musculoskeletal: Negative.   Neurological: Negative.   Psychiatric/Behavioral: Negative.   All other systems reviewed and are negative.    PHYSICAL EXAM: VS:  BP 118/62 (BP Location: Left Arm, Patient Position: Sitting, Cuff Size: Normal)   Pulse (!) 59   Ht 5\' 5"  (1.651 m)   Wt 170 lb 12 oz (77.5 kg)   BMI 28.41 kg/m  , BMI Body mass index is 28.41 kg/m. GEN: Well nourished, well developed, in no acute distress  HEENT: normal  Neck: no JVD, carotid bruits, or masses Cardiac: Irregularly irregular , no murmurs, rubs, or gallops,no edema  Respiratory:  clear to auscultation bilaterally, normal work of breathing GI: soft, nontender, nondistended, + BS MS: no deformity or atrophy  Skin: warm and dry, no rash Neuro:  Strength and sensation are intact Psych: euthymic mood, full affect    Recent Labs: 07/19/2015: TSH 1.76 12/06/2015: Pro B Natriuretic peptide (BNP) 2,175.0 12/19/2015: Hemoglobin 13.1; Platelets 242 01/09/2016: ALT 12; B Natriuretic Peptide 1,763.0; BUN 45; Creatinine, Ser 1.41; Potassium 3.9; Sodium 136    Lipid Panel Lab Results  Component Value Date   CHOL 95 06/04/2015   HDL 30.20 (L) 06/04/2015   LDLCALC 51 06/04/2015   TRIG 70.0 06/04/2015      Wt Readings from Last 3 Encounters:  02/07/16 170 lb 12 oz (77.5 kg)  01/04/16 160 lb 8 oz (72.8 kg)  12/19/15 167 lb (75.8 kg)       ASSESSMENT AND PLAN:  PAF (paroxysmal atrial fibrillation) (HCC) - Plan: EKG 12-Lead Rate well controlled, tolerating anticoagulation  Chronic diastolic CHF (congestive heart failure) ([place]) CHF education provided Long  discussion concerning CHF symptoms, monitoring weight, adjusting Lasix based on his fluid intake and weight Recommended he daily for 3 days then take Lasix every other day with potassium  Essential hypertension Blood pressure is well controlled on today's visit. No changes made to the medications.  Pure hypercholesterolemia Blood pressure is well controlled on today's visit. No changes made to the medications.   Total encounter time more than 25 minutes  Greater than 50% was spent in counseling and coordination of care with the patient   Disposition:   F/U  6 months   Orders Placed This Encounter  Procedures  . EKG 12-Lead     Signed, Esmond Plants, M.D., Ph.D. 02/07/2016  [place], [place]

## 2016-02-19 ENCOUNTER — Telehealth: Payer: Self-pay | Admitting: Cardiovascular Disease

## 2016-02-19 NOTE — Telephone Encounter (Signed)
Patient's daughter called and is worried that her dad is getting dehydrated due to not drinking due to the Lasix 20 mg and having to use the bathroom constantly. She ask that we call him on the amount to drink daily. He will listen better to [place] than her.

## 2016-02-20 ENCOUNTER — Ambulatory Visit (INDEPENDENT_AMBULATORY_CARE_PROVIDER_SITE_OTHER): Payer: Medicare Other | Admitting: Family Medicine

## 2016-02-20 ENCOUNTER — Encounter: Payer: Self-pay | Admitting: Family Medicine

## 2016-02-20 VITALS — BP 136/78 | HR 66 | Temp 98.0°F | Wt 168.2 lb

## 2016-02-20 DIAGNOSIS — I5032 Chronic diastolic (congestive) heart failure: Secondary | ICD-10-CM | POA: Diagnosis not present

## 2016-02-20 DIAGNOSIS — L989 Disorder of the skin and subcutaneous tissue, unspecified: Secondary | ICD-10-CM | POA: Diagnosis not present

## 2016-02-20 DIAGNOSIS — R3989 Other symptoms and signs involving the genitourinary system: Secondary | ICD-10-CM | POA: Insufficient documentation

## 2016-02-20 LAB — POCT URINALYSIS DIPSTICK
Bilirubin, UA: NEGATIVE
Glucose, UA: NEGATIVE
KETONES UA: NEGATIVE
LEUKOCYTES UA: NEGATIVE
NITRITE UA: NEGATIVE
PROTEIN UA: 100
Spec Grav, UA: 1.01
UROBILINOGEN UA: 0.2
pH, UA: 5

## 2016-02-20 NOTE — Progress Notes (Signed)
Pre visit review using our clinic review tool, if applicable. No additional management support is needed unless otherwise documented below in the visit note. 

## 2016-02-20 NOTE — Assessment & Plan Note (Signed)
Patient with several severely excoriated skin lesions that he believes are related to bug bites. The material he brings in today does not appear to be a bug. The lesions do not appear to be infected. I discussed monitoring these areas and applying antibiotic ointment over-the-counter to help prevent infection and aid in healing. I discussed that if they are related to bug bites he should wash all his clothes and sheets and towels in hot water and potentially use a bug bomb in the house to try to kill anything that is living in his house. He is given return precautions.

## 2016-02-20 NOTE — Patient Instructions (Addendum)
Nice to meet you. Please use the over-the-counter antibiotic ointment on the areas that have been scratched. Please monitor these for spreading redness, drainage, warmth, or any changes.  Please bring back a urine sample for [place] to check. Please wash her clothes and sheets in hot water. If you develop any breathing issues or fevers please seek medical attention.

## 2016-02-20 NOTE — Progress Notes (Signed)
Tommi Rumps, MD Phone: [phone]  Jason Wade is a 82 y.o. male who presents today for same-day visit.  Patient reports over the last month he intermittently gets bug bites. States he gets these while he is at home. Feels as though they may be related to fleas. He has had several areas on his back and right posterior shoulder. Also on his ear. 1 or 2 lesions on his left lower leg. No other lesions. He feels as though he pulls bugs off of these areas. He brings in several pieces of material for me to look at today and these appear to be scabs. There have been multiple people at his house that have not had any issues with this. Notes some itching with the bugs being attached though once he removes them no itching. No contacts with this. No recent travel. His daughter is here with him today and is concerned that he could have a UTI. He notes no issues with urination. No dysuria, frequency, or urgency. No abdominal pain. They report he is sharp as a tack and has not been confused. He does note some breathing issues that are stable. He is following with cardiology for this. He has Lasix that he takes as needed. No orthopnea. No shortness of breath at this time.  PMH: Nonsmoker  ROS see history of present illness  Objective  Physical Exam Vitals:   02/20/16 1423  BP: 136/78  Pulse: 66  Temp: 98 F (36.7 C)    BP Readings from Last 3 Encounters:  02/20/16 136/78  02/07/16 118/62  01/04/16 120/68   Wt Readings from Last 3 Encounters:  02/20/16 168 lb 3.2 oz (76.3 kg)  02/07/16 170 lb 12 oz (77.5 kg)  01/04/16 160 lb 8 oz (72.8 kg)    Physical Exam  Constitutional: No distress.  Cardiovascular: Normal rate, regular rhythm and normal heart sounds.   Pulmonary/Chest: Effort normal and breath sounds normal.  Abdominal: Soft. He exhibits no distension. There is no tenderness.  Musculoskeletal: He exhibits no edema.  Neurological: He is alert. Gait normal.  Skin: Skin is warm  and dry. He is not diaphoretic.  Patient was scattered excoriated areas over his right posterior shoulder and right upper thoracic mid back, similar lesion over his left lower extremity lower leg, there is no surrounding erythema, there is no drainage, there is no tenderness, there is no warmth     Assessment/Plan: Please see individual problem list.  Chronic diastolic CHF (congestive heart failure) ([place]) Breathing well today. Weight is down 2 pounds since he saw his cardiologist. He will continue as needed Lasix. He is given return precautions.  Skin lesions Patient with several severely excoriated skin lesions that he believes are related to bug bites. The material he brings in today does not appear to be a bug. The lesions do not appear to be infected. I discussed monitoring these areas and applying antibiotic ointment over-the-counter to help prevent infection and aid in healing. I discussed that if they are related to bug bites he should wash all his clothes and sheets and towels in hot water and potentially use a bug bomb in the house to try to kill anything that is living in his house. He is given return precautions.  Urine troubles Patient with no specific urinary issues today, though his daughter wonders if some of his symptoms could be related to UTI. I discussed that we typically do not treat for UTIs even if the urine culture grows out bacteria  in somebody of his age unless they have urinary tract symptoms. Patient does not have any UTI symptoms and I advised that even if we checked a UA I likely would not treat for UTI, the patient's daughter persisted in asking about UTI thus we will check a UA. Patient unable to urinate in the office for [place] and they will bring this back for [place] to test.   Orders Placed This Encounter  Procedures  . Urine culture  . Urine Microscopic Only  . POCT Urinalysis Dipstick    Standing Status:   Future    Number of Occurrences:   1    Standing Expiration  Date:   03/21/2016    Tommi Rumps, MD [place]

## 2016-02-20 NOTE — Assessment & Plan Note (Signed)
Breathing well today. Weight is down 2 pounds since he saw his cardiologist. He will continue as needed Lasix. He is given return precautions.

## 2016-02-20 NOTE — Assessment & Plan Note (Signed)
Patient with no specific urinary issues today, though his daughter wonders if some of his symptoms could be related to UTI. I discussed that we typically do not treat for UTIs even if the urine culture grows out bacteria in somebody of his age unless they have urinary tract symptoms. Patient does not have any UTI symptoms and I advised that even if we checked a UA I likely would not treat for UTI, the patient's daughter persisted in asking about UTI thus we will check a UA. Patient unable to urinate in the office for [place] and they will bring this back for [place] to test.

## 2016-02-21 ENCOUNTER — Telehealth: Payer: Self-pay | Admitting: *Deleted

## 2016-02-21 LAB — URINALYSIS, MICROSCOPIC ONLY

## 2016-02-21 NOTE — Telephone Encounter (Signed)
Would await urine studies. Not likely from neuropathy. We can consider imaging if persists.

## 2016-02-21 NOTE — Telephone Encounter (Signed)
Pt daughter was called and she stated that if he does not have in urinary issues when results comeback she wondered if he could be dehydrated? Also, she stated that he is going to cancer doctor to see if he is still cancer free and she hope he will have a scan to make sure she wonders if he could possible have cancer of the brain? The daughter seemed very uneasy about what could be causing her father to have hallucinations and scratching himself so hard he bleeds. She wondered if neuropathy could play a hand in why his scratching if it could be due to him not feeling it. Daughter was made aware that we would get back to her with his results.

## 2016-02-21 NOTE — Telephone Encounter (Signed)
Patient daughter requested to have lab results. Contact [name] [phone]

## 2016-02-22 LAB — URINE CULTURE

## 2016-02-22 NOTE — Telephone Encounter (Signed)
Daughter was talk to via Jason Wade about results and pt stated he was doing well. Please see results note.

## 2016-03-04 NOTE — Progress Notes (Signed)
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  INTERVAL HISTORY: Patient returns to clinic today for laboratory work and routine yearly evaluation.  He continues to feel well and is asymptomatic. He had a basal cell carcinoma removed from his right arm approximately one year ago, but otherwise has felt well. He does not complain of weakness or fatigue today.  He continues to have significant right upper extremity lymphedema which is well controlled with his pump and sleeve.  He has no neurologic complaints.  He has a good appetite and denies weight loss.  He denies any recent fevers or illnesses.  He denies any shortness breath or cough.  He has no nausea, vomiting, constipation, or diarrhea.  He has no urinary complaints.  Patient offers no further specific complaints today.   REVIEW OF SYSTEMS:   Review of Systems  Constitutional: Negative.  Negative for fever, malaise/fatigue and weight loss.  Respiratory: Negative.  Negative for cough and shortness of breath.   Cardiovascular: Positive for leg swelling. Negative for chest pain.  Gastrointestinal: Negative.  Negative for abdominal pain.  Genitourinary: Negative.   Musculoskeletal: Negative.        Right arm lymphedema   Skin: Negative.   Neurological: Positive for sensory change. Negative for weakness.  Psychiatric/Behavioral: Negative.  The patient is not nervous/anxious.     As per HPI. Otherwise, a complete review of systems is negative.  PAST MEDICAL HISTORY: Past Medical History:  Diagnosis Date  . BPH (benign  prostatic hyperplasia)   . Carotid arterial disease ([place])    a. 2015: s/p R CEA.  . Chronic diastolic CHF (congestive heart failure) ([place])    a. echo 11/2015: EF 55-60%, possible HK of inf wall, severe AS mean gradient 29, peak gradient 45, valve area 0.41, mild to mod MR, LA dilated @ 50 mild RA dilatation, left pleural effusion  . Chronic low back pain   . CKD (chronic kidney disease), stage II   . Essential hypertension   . Falls   . Gout   . HLD (hyperlipidemia)   . Hypothyroidism   . Lower extremity neuropathy   . PAF (paroxysmal atrial fibrillation) ([place])    a. 11/2010 Echo: EF >55%, diast dysfxn, mild conc lvh.  . Severe aortic stenosis    a. echo 11/2015 w/ mean gradient 29 mmHg, peak gradient 45 mmHg, valve area 0.41 cm^2  . Squamous cell carcinoma of skin    a. x3 s/p local excision followed by more extensive surgery due to lymphadenopathy on the right.    PAST SURGICAL HISTORY: Past Surgical History:  Procedure Laterality Date  . CARDIAC CATHETERIZATION  04-08-2005  . KNEE SURGERY  02-09-2007   replacement  . LAMINECTOMY AND MICRODISCECTOMY LUMBAR SPINE      Bilateral  at L4-5 followed by L4-5 right microdiskectomy.    FAMILY HISTORY Family History  Problem Relation Age of Onset  . Hypertension Mother   . Stroke Mother        ADVANCED DIRECTIVES:    HEALTH MAINTENANCE: Social History  Substance Use Topics  . Smoking status: Never Smoker  . Smokeless tobacco: Never Used  .  Alcohol use No     Colonoscopy:  PAP:  Bone density:  Lipid panel:  No Known Allergies  Current Outpatient Prescriptions  Medication Sig Dispense Refill  . cloNIDine (CATAPRES) 0.3 MG tablet Take 0.3 mg by mouth 2 (two) times daily.    . furosemide (LASIX) 20 MG tablet Take 1 tablet (20 mg total) by mouth daily as needed. 90 tablet 3  . hydrochlorothiazide (HYDRODIURIL) 25 MG tablet TAKE 1 TABLET (25 MG TOTAL) BY MOUTH DAILY. 90 tablet 3  . metoprolol succinate (TOPROL-XL) 100 MG  24 hr tablet Take 1 tablet (100 mg total) by mouth daily. Take with or immediately following a meal. 90 tablet 3  . Rivaroxaban (XARELTO) 20 MG TABS tablet Take 1 tablet (20 mg total) by mouth daily with supper. 90 tablet 3  . simvastatin (ZOCOR) 20 MG tablet Take 1 tablet (20 mg total) by mouth at bedtime. 90 tablet 2  . SYNTHROID 100 MCG tablet TAKE 1 TABLET DAILY 90 tablet 3  . terazosin (HYTRIN) 2 MG capsule Take 1 capsule (2 mg total) by mouth at bedtime. 90 capsule 2   No current facility-administered medications for this visit.     OBJECTIVE: Vitals:   03/06/16 1017  BP: (!) 149/72  Pulse: (!) 43  Resp: 18  Temp: 98.4 F (36.9 C)     Body mass index is 27.66 kg/m.    ECOG FS:0 - Asymptomatic  General: Well-developed, well-nourished, no acute distress. Eyes: Pink conjunctiva, anicteric sclera. Lungs: Clear to auscultation bilaterally. Heart: Regular rate and rhythm. No rubs, murmurs, or gallops. Abdomen: Soft, nontender, nondistended. No organomegaly noted, normoactive bowel sounds. Musculoskeletal: right arm lymphedema in sleeve.   Neuro: Alert, answering all questions appropriately. Cranial nerves grossly intact. Skin: No rashes or petechiae noted. Psych: Normal affect.    LAB RESULTS:  Lab Results  Component Value Date   NA 136 03/06/2016   K 4.0 03/06/2016   CL 102 03/06/2016   CO2 29 03/06/2016   GLUCOSE 92 03/06/2016   BUN 37 (H) 03/06/2016   CREATININE 1.30 (H) 03/06/2016   CALCIUM 8.9 03/06/2016   PROT 6.4 (L) 01/09/2016   ALBUMIN 3.1 (L) 01/09/2016   AST 27 01/09/2016   ALT 12 (L) 01/09/2016   ALKPHOS 54 01/09/2016   BILITOT 0.8 01/09/2016   GFRNONAA 47 (L) 03/06/2016   GFRAA 54 (L) 03/06/2016    Lab Results  Component Value Date   WBC 6.4 03/06/2016   NEUTROABS 4.4 03/06/2016   HGB 13.1 03/06/2016   HCT 38.6 (L) 03/06/2016   MCV 91.7 03/06/2016   PLT 223 03/06/2016     STUDIES: No results found.  ASSESSMENT: Squamous cell carcinoma  of the right axilla, most likely skin primary.  PLAN:    1.  Squamous cell carcinoma: Most likely skin primary. Previously, CT scan revealed no evidence of disease. No further imaging is necessary unless there is suspicion of recurrence.  Patient completed his adjuvant chemotherapy and XRT in September 2012. Patient is now greater than 5 years removed from completing his treatment with no evidence of disease. After discussion with the patient and his daughter, it was agreed upon the no further follow-up is necessary. Patient also indicated that if he had a recurrence he would unlikely pursue aggressive treatment. continue follow-up with primary care and dermatology as recommended.  2.  Right upper extremity swelling: Patient was given a referral back to lymphedema clinic for further evaluation. 3.  Atrial fibrillation: Heart rate currently well-controlled,  treatment per Dr. Rockey Situ. 4. Leg swelling: Continue Lasix as needed per cardiology.  Patient expressed understanding and was in agreement with this plan. He also understands that He can call clinic at any time with any questions, concerns, or complaints.      Lloyd Huger, MD   03/06/2016 10:36 AM

## 2016-03-05 ENCOUNTER — Other Ambulatory Visit: Payer: Self-pay | Admitting: *Deleted

## 2016-03-05 DIAGNOSIS — C44622 Squamous cell carcinoma of skin of right upper limb, including shoulder: Secondary | ICD-10-CM

## 2016-03-06 ENCOUNTER — Inpatient Hospital Stay: Payer: Medicare Other | Attending: Oncology | Admitting: Oncology

## 2016-03-06 ENCOUNTER — Inpatient Hospital Stay: Payer: Medicare Other

## 2016-03-06 VITALS — BP 149/72 | HR 43 | Temp 98.4°F | Resp 18 | Wt 166.2 lb

## 2016-03-06 DIAGNOSIS — G8929 Other chronic pain: Secondary | ICD-10-CM | POA: Diagnosis not present

## 2016-03-06 DIAGNOSIS — Z9221 Personal history of antineoplastic chemotherapy: Secondary | ICD-10-CM | POA: Insufficient documentation

## 2016-03-06 DIAGNOSIS — I130 Hypertensive heart and chronic kidney disease with heart failure and stage 1 through stage 4 chronic kidney disease, or unspecified chronic kidney disease: Secondary | ICD-10-CM | POA: Diagnosis not present

## 2016-03-06 DIAGNOSIS — E785 Hyperlipidemia, unspecified: Secondary | ICD-10-CM | POA: Insufficient documentation

## 2016-03-06 DIAGNOSIS — I480 Paroxysmal atrial fibrillation: Secondary | ICD-10-CM | POA: Insufficient documentation

## 2016-03-06 DIAGNOSIS — Z79899 Other long term (current) drug therapy: Secondary | ICD-10-CM | POA: Diagnosis not present

## 2016-03-06 DIAGNOSIS — R600 Localized edema: Secondary | ICD-10-CM | POA: Insufficient documentation

## 2016-03-06 DIAGNOSIS — I890 Lymphedema, not elsewhere classified: Secondary | ICD-10-CM

## 2016-03-06 DIAGNOSIS — Z923 Personal history of irradiation: Secondary | ICD-10-CM

## 2016-03-06 DIAGNOSIS — I5032 Chronic diastolic (congestive) heart failure: Secondary | ICD-10-CM | POA: Diagnosis not present

## 2016-03-06 DIAGNOSIS — E039 Hypothyroidism, unspecified: Secondary | ICD-10-CM | POA: Diagnosis not present

## 2016-03-06 DIAGNOSIS — Z7901 Long term (current) use of anticoagulants: Secondary | ICD-10-CM | POA: Diagnosis not present

## 2016-03-06 DIAGNOSIS — M109 Gout, unspecified: Secondary | ICD-10-CM | POA: Insufficient documentation

## 2016-03-06 DIAGNOSIS — N400 Enlarged prostate without lower urinary tract symptoms: Secondary | ICD-10-CM | POA: Diagnosis not present

## 2016-03-06 DIAGNOSIS — M545 Low back pain: Secondary | ICD-10-CM | POA: Diagnosis not present

## 2016-03-06 DIAGNOSIS — C44622 Squamous cell carcinoma of skin of right upper limb, including shoulder: Secondary | ICD-10-CM

## 2016-03-06 DIAGNOSIS — Z85828 Personal history of other malignant neoplasm of skin: Secondary | ICD-10-CM | POA: Insufficient documentation

## 2016-03-06 LAB — CBC WITH DIFFERENTIAL/PLATELET
BASOS ABS: 0 10*3/uL (ref 0–0.1)
BASOS PCT: 1 %
EOS ABS: 0.1 10*3/uL (ref 0–0.7)
EOS PCT: 2 %
HCT: 38.6 % — ABNORMAL LOW (ref 40.0–52.0)
Hemoglobin: 13.1 g/dL (ref 13.0–18.0)
Lymphocytes Relative: 16 %
Lymphs Abs: 1 10*3/uL (ref 1.0–3.6)
MCH: 31 pg (ref 26.0–34.0)
MCHC: 33.8 g/dL (ref 32.0–36.0)
MCV: 91.7 fL (ref 80.0–100.0)
Monocytes Absolute: 0.8 10*3/uL (ref 0.2–1.0)
Monocytes Relative: 12 %
Neutro Abs: 4.4 10*3/uL (ref 1.4–6.5)
Neutrophils Relative %: 69 %
PLATELETS: 223 10*3/uL (ref 150–440)
RBC: 4.21 MIL/uL — AB (ref 4.40–5.90)
RDW: 15.4 % — ABNORMAL HIGH (ref 11.5–14.5)
WBC: 6.4 10*3/uL (ref 3.8–10.6)

## 2016-03-06 LAB — BASIC METABOLIC PANEL
ANION GAP: 5 (ref 5–15)
BUN: 37 mg/dL — AB (ref 6–20)
CO2: 29 mmol/L (ref 22–32)
Calcium: 8.9 mg/dL (ref 8.9–10.3)
Chloride: 102 mmol/L (ref 101–111)
Creatinine, Ser: 1.3 mg/dL — ABNORMAL HIGH (ref 0.61–1.24)
GFR, EST AFRICAN AMERICAN: 54 mL/min — AB (ref >60)
GFR, EST NON AFRICAN AMERICAN: 47 mL/min — AB (ref >60)
Glucose, Bld: 92 mg/dL (ref 65–99)
POTASSIUM: 4 mmol/L (ref 3.5–5.1)
SODIUM: 136 mmol/L (ref 135–145)

## 2016-03-06 NOTE — Progress Notes (Signed)
Patient states no complains.  Patient states that his lymphedema pump has stopped working, asking for an order for a new machine.

## 2016-03-15 ENCOUNTER — Encounter: Payer: Self-pay | Admitting: Occupational Therapy

## 2016-03-18 ENCOUNTER — Ambulatory Visit: Payer: Medicare Other | Admitting: Occupational Therapy

## 2016-03-22 ENCOUNTER — Other Ambulatory Visit: Payer: Self-pay | Admitting: Family Medicine

## 2016-03-24 NOTE — Telephone Encounter (Signed)
Refilled 06/04/15. Pt last seen 10/08/15. Please advise?

## 2016-03-31 ENCOUNTER — Ambulatory Visit: Payer: Medicare Other | Attending: Oncology | Admitting: Occupational Therapy

## 2016-03-31 DIAGNOSIS — I890 Lymphedema, not elsewhere classified: Secondary | ICD-10-CM

## 2016-03-31 NOTE — Patient Instructions (Signed)
Pt and son in law educated on plan   Pt to replace his night time sleeve's - power sleeve with the xtra one he got in 2013 - to provide him more compression at night time - to prevent these pockets of fibrotic lymph at volar wrist and upper arm And wear it during the day some at home - to decrease his measurements until getting the pump  And as needed his exiting daytime compression sleeves - but ed on wearing it correctly from ulnar styloid to 1-2 fingers out of axilla    And when getting the pump - and cont with his old routine

## 2016-03-31 NOTE — Therapy (Signed)
[facility] [address] [place], [address] Phone: [phone]   Fax:  [phone]  Occupational Therapy Evaluation  Patient Details  Name: Jason Wade MRN: [id number] Date of Birth: 02-Mar-1924 Referring Provider: Grayland Ormond  Encounter Date: 03/31/2016      OT End of Session - 03/31/16 1710    Visit Number 1   Number of Visits 3   Date for OT Re-Evaluation 05/12/16   OT Start Time 1345   OT Stop Time 1431   OT Time Calculation (min) 46 min   Activity Tolerance Patient tolerated treatment well   Behavior During Therapy [facility] for tasks assessed/performed      Past Medical History:  Diagnosis Date  . BPH (benign prostatic hyperplasia)   . Carotid arterial disease ([place])    a. 2015: s/p R CEA.  . Chronic diastolic CHF (congestive heart failure) ([place])    a. echo 11/2015: EF 55-60%, possible HK of inf wall, severe AS mean gradient 29, peak gradient 45, valve area 0.41, mild to mod MR, LA dilated @ 50 mild RA dilatation, left pleural effusion  . Chronic low back pain   . CKD (chronic kidney disease), stage II   . Essential hypertension   . Falls   . Gout   . HLD (hyperlipidemia)   . Hypothyroidism   . Lower extremity neuropathy   . PAF (paroxysmal atrial fibrillation) ([place])    a. 11/2010 Echo: EF >55%, diast dysfxn, mild conc lvh.  . Severe aortic stenosis    a. echo 11/2015 w/ mean gradient 29 mmHg, peak gradient 45 mmHg, valve area 0.41 cm^2  . Squamous cell carcinoma of skin    a. x3 s/p local excision followed by more extensive surgery due to lymphadenopathy on the right.    Past Surgical History:  Procedure Laterality Date  . CARDIAC CATHETERIZATION  04-08-2005  . KNEE SURGERY  02-09-2007   replacement  . LAMINECTOMY AND MICRODISCECTOMY LUMBAR SPINE      Bilateral  at L4-5 followed by L4-5 right microdiskectomy.    There were no vitals filed for this visit.      Subjective Assessment -  03/31/16 1656    Subjective  I was doing good with my pump, night and daytime sleeve until my pump started giving me problems early Nov - I tried to phone the guys - but they never phone back    Patient Stated Goals Want a new pump to keep my swelling in my R arm under control    Currently in Pain? No/denies           [facility] OT Assessment - 03/31/16 0001      Assessment   Diagnosis R UE lymphedema   Referring Provider Finnegan   Onset Date 02/20/16   Prior Therapy --  2013 - fitted with pump ,day and night time sleeve     Home  Environment   Lives With Alone     Prior Function   Vocation Retired   Leisure watch tv, R hand dominant -           [place] - 03/31/16 1659      Surgeries   Other Surgery Date --  2013 Squamous cell carcinoma of skin   Number Lymph Nodes Removed --  per pt all ln removed?     Treatment   Past Chemotherapy Treatment Yes   Date --  2013   Past Radiation Treatment Yes  Date --  2013     What other symptoms do you have   Are you Having Heaviness or Tightness Yes   Are you having pitting edema Yes   Is it Hard or Difficult finding clothes that fit No   Do you have infections No   Other Symptoms --  Pt has pocket at volar wrist and upper arm      Lymphedema Stage   Stage STAGE 2 SPONTANEOUSLY IRREVERSIBLE     Right Upper Extremity Lymphedema   15 cm Proximal to Olecranon Process 31.5 cm   10 cm Proximal to Olecranon Process 33.5 cm   Olecranon Process 29 cm   15 cm Proximal to Ulnar Styloid Process 26.5 cm   10 cm Proximal to Ulnar Styloid Process 22 cm   Just Proximal to Ulnar Styloid Process 17 cm   Across Hand at [employer] 20.7 cm   At [place] of 2nd Digit 7.2 cm     Left Upper Extremity Lymphedema   15 cm Proximal to Olecranon Process 29.2 cm   10 cm Proximal to Olecranon Process 26 cm   Olecranon Process 25 cm   15 cm Proximal to Ulnar Styloid Process 22 cm   10 cm Proximal to Ulnar Styloid  Process 19.2 cm   Just Proximal to Ulnar Styloid Process 16.8 cm   Across Hand at [employer] 20.6 cm   At [place] of 2nd Digit 7 cm       Pt and son in law educated on plan   Pt to replace his night time sleeve's - power sleeve with the xtra one he got in 2013 - to provide him more compression at night time - to prevent these pockets of fibrotic lymph at volar wrist and upper arm And wear it during the day some at home - to decrease his measurements until getting the pump  And as needed his exiting daytime compression sleeves - but ed on wearing it correctly from ulnar styloid to 1-2 fingers out of axilla    And when getting the pump - and cont with his old routine                  OT Education - 03/31/16 1710    Education provided Yes   Education Details homeprogram at home to keep R UE lymphedema under control   Person(s) Educated Patient;Child(ren)   Methods Explanation;Demonstration;Tactile cues;Verbal cues;Handout   Comprehension Verbal cues required;Returned demonstration;Verbalized understanding             OT Long Term Goals - 03/31/16 1718      OT LONG TERM GOAL #1   Title Pt show or pt /family report decrease in R UE circumference and fibrotic pockets with use of new pump and his compression sleeves   Baseline Forearm increase by 4.5 cm , elbow 4 cm and upper arm 7.5 cm - with fibrotic pockets at volar wrist and upper arm   Time 6   Period Weeks   Status New               Plan - 03/31/16 1711    Clinical Impression Statement Pt present this date with stage 2 lymphedema in R UE - pt had Squamous cell carcinoma of skin on R shoulder 2013 - with chemo and radiation - he was seen by this CLT in 2013 for R UE lymphedema - and was doing great since then with using pump , night time Reidsleeve comfort ,  and daytime compression sleeve - pt refer to therapy because of his pump that broked about 5 wks ago - pt has pocket of swelling  and fribrosis in  volar wrist and upper arm - he show increase circumference of 4.5 at forearm , 4 cm at elbow, 7.5 at upper arm that is worse than what it was in 2013 - would recommend pump for pt to use at home again - because of age, living alone , and preventing infections     Rehab Potential Good   OT Frequency Biweekly   OT Duration 6 weeks   OT Treatment/Interventions Self-care/ADL training;Patient/family education;DME and/or AE instruction   Plan info send to rep for pump - and pt to switch to his 2nd powersleeve for night time to increase compression at night time - he was still using first one - will see pt as needed    OT Home Exercise Plan see pt instructions    Consulted and Agree with Plan of Care Patient;Family member/caregiver      Patient will benefit from skilled therapeutic intervention in order to improve the following deficits and impairments:  Increased edema, Impaired UE functional use  Visit Diagnosis: Lymphedema, not elsewhere classified - Plan: Ot plan of care cert/re-cert      G-Codes - [id number]    Functional Assessment Tool Used circumference , ADL's and assessment of garment use, palpation- clinical judgement   Functional Limitation Self care   Self Care Current Status [id number]) At least 20 percent but less than 40 percent impaired, limited or restricted   Self Care Goal Status [id number]) At least 1 percent but less than 20 percent impaired, limited or restricted      Problem List Patient Active Problem List   Diagnosis Date Noted  . Skin lesions 02/20/2016  . Urine troubles 02/20/2016  . Carotid arterial disease ([place])   . Chronic diastolic CHF (congestive heart failure) ([place])   . CKD (chronic kidney disease), stage II   . Essential hypertension   . Falls   . HLD (hyperlipidemia)   . Lower extremity neuropathy   . PAF (paroxysmal atrial fibrillation) ([place])   . Severe aortic stenosis   . Edema of left lower extremity 12/10/2015  . Left ankle injury 12/10/2015  .  BPH (benign prostatic hyperplasia) 06/05/2015  . Hypothyroidism 06/05/2015  . Anemia 06/05/2015  . Hyperlipidemia 05/05/2014  . Carotid stenosis 11/04/2013  . Squamous cell carcinoma of skin of right arm, including shoulder 04/22/2013  . Right carotid bruit 03/28/2013    Rosalyn Gess  OTR/L,CLT 03/31/2016, 5:26 PM  [facility] [address] [place], [address] Phone: [phone]   Fax:  [phone]  Name: JERICK FILAK MRN: [id number] Date of Birth: 30-Mar-1924

## 2016-04-07 ENCOUNTER — Ambulatory Visit: Payer: Medicare Other | Admitting: Family Medicine

## 2016-04-07 ENCOUNTER — Encounter: Payer: Self-pay | Admitting: Family Medicine

## 2016-04-07 ENCOUNTER — Ambulatory Visit (INDEPENDENT_AMBULATORY_CARE_PROVIDER_SITE_OTHER): Payer: Medicare Other | Admitting: Family Medicine

## 2016-04-07 DIAGNOSIS — I5032 Chronic diastolic (congestive) heart failure: Secondary | ICD-10-CM | POA: Diagnosis not present

## 2016-04-07 DIAGNOSIS — E785 Hyperlipidemia, unspecified: Secondary | ICD-10-CM | POA: Diagnosis not present

## 2016-04-07 DIAGNOSIS — I10 Essential (primary) hypertension: Secondary | ICD-10-CM | POA: Diagnosis not present

## 2016-04-07 NOTE — Progress Notes (Signed)
   Subjective:  Patient ID: Jason Wade, male    DOB: December 27, 1923  Age: 82 y.o. MRN: [id number]  CC: Follow up  HPI:  82 year old gentleman with aortic stenosis, H relation, hypertension, hyperlipidemia, CKD, chronic diastolic heart failure presents for follow-up.  HTN  Has been well controlled.  Currently on Clonidine, Lasix, HCTZ, Metoprolol.  HLD  At goal on Simvastatin.  CHF  He endorses that he has not taken his diuretic for the past few days. Has had some SOB.  Edema improved.  He is followed closely by cardiology.  Social Hx   Social History   Social History  . Marital status: Married    Spouse name: N/A  . Number of children: N/A  . Years of education: N/A   Social History Main Topics  . Smoking status: Never Smoker  . Smokeless tobacco: Never Used  . Alcohol use No  . Drug use: No  . Sexual activity: Yes   Other Topics Concern  . None   Social History Narrative  . None   Review of Systems  Constitutional: Negative.   Respiratory: Positive for shortness of breath.   Cardiovascular: Positive for leg swelling.   Objective:  BP (!) 146/67 (BP Location: Left Arm, Patient Position: Sitting, Cuff Size: Normal)   Pulse 70   Temp 97.5 F (36.4 C) (Oral)   Resp 14   Wt 170 lb 6 oz (77.3 kg)   SpO2 99%   BMI 28.35 kg/m   BP/Weight 04/07/2016 03/06/2016 [id number]  Systolic BP 123456 123456 [id number]  Diastolic BP 67 72 78  Wt. (Lbs) 170.38 166.23 168.2  BMI 28.35 27.66 27.99   Physical Exam  Constitutional: He appears well-developed. No distress.  Cardiovascular: Normal rate and regular rhythm.   [id number] systolic murmur. No significant LE edema noted today.  Pulmonary/Chest: Effort normal.  R sided wheezing noted.  Neurological: He is alert.  Psychiatric: He has a normal mood and affect.  Vitals reviewed.  Lab Results  Component Value Date   WBC 6.4 03/06/2016   HGB 13.1 03/06/2016   HCT 38.6 (L) 03/06/2016   PLT 223 03/06/2016   GLUCOSE 92  03/06/2016   CHOL 95 06/04/2015   TRIG 70.0 06/04/2015   HDL 30.20 (L) 06/04/2015   LDLCALC 51 06/04/2015   ALT 12 (L) 01/09/2016   AST 27 01/09/2016   NA 136 03/06/2016   K 4.0 03/06/2016   CL 102 03/06/2016   CREATININE 1.30 (H) 03/06/2016   BUN 37 (H) 03/06/2016   CO2 29 03/06/2016   TSH 1.76 07/19/2015   INR 1.1 06/02/2013    Assessment & Plan:   Problem List Items Addressed This Visit    Hyperlipidemia    At goal. Continue Simvastatin.      Essential hypertension    Has been well controlled. Slightly up today. Advised compliance with meds, especially his diuretic.      Chronic diastolic CHF (congestive heart failure) ([place])    Appears euvolemic today but he endorses some SOB. Has not taking Lasix as prescribed. Advised compliance. Advised dose today.        Follow-up: 3-6 months.  [place]

## 2016-04-07 NOTE — Patient Instructions (Signed)
Continue your medications.  Follow up in 3-6 months.  Take care  Dr. Tunya Held  

## 2016-04-07 NOTE — Assessment & Plan Note (Signed)
At goal.  Continue Simvastatin.  

## 2016-04-07 NOTE — Assessment & Plan Note (Signed)
Appears euvolemic today but he endorses some SOB. Has not taking Lasix as prescribed. Advised compliance. Advised dose today.

## 2016-04-07 NOTE — Progress Notes (Signed)
Pre visit review using our clinic review tool, if applicable. No additional management support is needed unless otherwise documented below in the visit note. 

## 2016-04-07 NOTE — Assessment & Plan Note (Signed)
Has been well controlled. Slightly up today. Advised compliance with meds, especially his diuretic.

## 2016-04-29 ENCOUNTER — Other Ambulatory Visit: Payer: Self-pay | Admitting: Family Medicine

## 2016-04-29 NOTE — Telephone Encounter (Signed)
Refilled 06/04/15. Pt last seen 04/07/16. Last labs in 05/2015. Please advise?

## 2016-05-27 ENCOUNTER — Other Ambulatory Visit: Payer: Self-pay | Admitting: Cardiovascular Disease

## 2016-06-09 ENCOUNTER — Ambulatory Visit: Payer: Medicare Other | Attending: Oncology | Admitting: Occupational Therapy

## 2016-06-09 DIAGNOSIS — I890 Lymphedema, not elsewhere classified: Secondary | ICD-10-CM | POA: Diagnosis present

## 2016-06-09 NOTE — Therapy (Signed)
[facility] [address] [place], [address] Phone: [phone]   Fax:  [phone]  Occupational Therapy Treatment  Patient Details  Name: Jason Wade MRN: [id number] Date of Birth: [birth year] Referring Provider: Grayland Ormond  Encounter Date: 06/09/2016      OT End of Session - 06/09/16 1103    Visit Number 2   Number of Visits 3   Date for OT Re-Evaluation 07/07/16   OT Start Time 1003   OT Stop Time 1053   OT Time Calculation (min) 50 min   Activity Tolerance Patient tolerated treatment well   Behavior During Therapy [facility] for tasks assessed/performed      Past Medical History:  Diagnosis Date  . BPH (benign prostatic hyperplasia)   . Carotid arterial disease ([place])    a. 2015: s/p R CEA.  . Chronic diastolic CHF (congestive heart failure) ([place])    a. echo 11/2015: EF 55-60%, possible HK of inf wall, severe AS mean gradient 29, peak gradient 45, valve area 0.41, mild to mod MR, LA dilated @ 50 mild RA dilatation, left pleural effusion  . Chronic low back pain   . CKD (chronic kidney disease), stage II   . Essential hypertension   . Falls   . Gout   . HLD (hyperlipidemia)   . Hypothyroidism   . Lower extremity neuropathy   . PAF (paroxysmal atrial fibrillation) ([place])    a. 11/2010 Echo: EF >55%, diast dysfxn, mild conc lvh.  . Severe aortic stenosis    a. echo 11/2015 w/ mean gradient 29 mmHg, peak gradient 45 mmHg, valve area 0.41 cm^2  . Squamous cell carcinoma of skin    a. x3 s/p local excision followed by more extensive surgery due to lymphadenopathy on the right.    Past Surgical History:  Procedure Laterality Date  . CARDIAC CATHETERIZATION  04-08-2005  . KNEE SURGERY  02-09-2007   replacement  . LAMINECTOMY AND MICRODISCECTOMY LUMBAR SPINE      Bilateral  at L4-5 followed by L4-5 right microdiskectomy.    There were no vitals filed for this visit.      Subjective Assessment -  06/09/16 1101    Subjective  The sleeve of my pump feels to long - and he switch it out for smaller one - and then it comes over the shoulder - but it feels like it is not getting my inner upper arm swelling down  - - I tried the new compression sleeve over my night time one but it feels to tight  - the day sleeves  I like and want to cont with them - my chest and under my arm was swollen before but it is doing good  lately   Patient Stated Goals Want a new pump to keep my swelling in my R arm under control    Currently in Pain? No/denies             LYMPHEDEMA/ONCOLOGY QUESTIONNAIRE - 06/09/16 1016      Right Upper Extremity Lymphedema   15 cm Proximal to Olecranon Process 32 cm   10 cm Proximal to Olecranon Process 32.5 cm   Olecranon Process 26.5 cm   15 cm Proximal to Ulnar Styloid Process 27 cm   10 cm Proximal to Ulnar Styloid Process 22 cm   Just Proximal to Ulnar Styloid Process 17 cm   Across Hand at [employer] 20.4 cm   At [place] of 2nd Digit  6.8 cm       Pt arrive with [employer] 59 sleeve on R UE  Measurements taken on R UE circumference  - see flow sheet  Elbow decrease 2.3 and distal upper arm 1 cm But wrist , forearm and upper arm stayed the same    Pt report he was using his pump for about month now - but swelling in the inner upper arm " do not go down and stays hard"  Upon examination - pt's  pockets of fibrotic lymph at volar wrist and upper arm decreased - but still thick fibrotic at  inner distal upper arm   He is using pump 2 x day ( in am and pm ) for 30 min   And is wearing night time compression - comfort Reidsleeve - the replacement power sleeve that he had in drawer from before - feels to tight and had to switch back to old one Pt had lymphedema since 2013 - and doing night time , daytime compression and pump since then                     OT Education - 06/09/16 1707    Education Details Plan to try the new powersleeve again over  Reidsleeve and request new pump    Person(s) Educated Patient;Child(ren)   Methods Explanation;Demonstration;Tactile cues;Verbal cues   Comprehension Verbalized understanding;Returned demonstration;Verbal cues required             OT Long Term Goals - 06/09/16 1709      OT LONG TERM GOAL #1   Title Pt show or pt /family report decrease in R UE circumference and fibrotic pockets with use of new pump and his compression sleeves   Baseline Forearm increase by 4.5 cm , elbow 4 cm and upper arm 7.5 cm - with fibrotic pockets at volar wrist and upper arm at eval - now only decrease 1 -2.5 cm - nothing at forerm    Time 4   Period Weeks   Status On-going               Plan - 06/09/16 1117    Clinical Impression Statement Pt return this week after using pump for more than month after previous pump broked - she was using pump , nighttime and daytime compression since 2013 after having squamous cell carcinoma of skin R shoulder -  measurements increase  in circumference 4.5 cm at forearm , 4 cm at elbow , 7.5 at upper arm  compare to 2013 - pt now return after using pump and only decrease 1 cm upper arm , 2.5 at elbow - he still has large pocket of fibrosis at inner upper arm that did not decrease - pt live alone and daugher close by looking  at his diet needs. Pt did had in past lymphedema in chest and under arm - but  with treatment could keep it under control - to prevent future cellulitis issues in R UE - pt is R hand dominant - I would recommend strongly for him [phone] pump at can be programmed  for each chamber to be use with his compression garments    Rehab Potential Good   OT Frequency Biweekly   OT Duration 4 weeks   OT Treatment/Interventions Self-care/ADL training;Patient/family education;DME and/or AE instruction   Plan Await new pump to be able to adjust each chamber to prevent fibrosis at upper arm and wrist    OT Home Exercise Plan see pt instructions  Consulted and Agree  with Plan of Care Patient;Family member/caregiver      Patient will benefit from skilled therapeutic intervention in order to improve the following deficits and impairments:  Increased edema, Impaired UE functional use  Visit Diagnosis: Lymphedema, not elsewhere classified - Plan: Ot plan of care cert/re-cert    Problem List Patient Active Problem List   Diagnosis Date Noted  . Carotid arterial disease ([place])   . Chronic diastolic CHF (congestive heart failure) ([place])   . CKD (chronic kidney disease), stage II   . Essential hypertension   . HLD (hyperlipidemia)   . Lower extremity neuropathy   . PAF (paroxysmal atrial fibrillation) ([place])   . Severe aortic stenosis   . BPH (benign prostatic hyperplasia) 06/05/2015  . Hypothyroidism 06/05/2015  . Hyperlipidemia 05/05/2014  . Squamous cell carcinoma of skin of right arm, including shoulder 04/22/2013  . Right carotid bruit 03/28/2013    Rosalyn Gess OTR/L,CLT 06/09/2016, 5:24 PM  [facility] [address] [place], [address] Phone: [phone]   Fax:  [phone]  Name: BERTHOLD WALTHER MRN: [id number] Date of Birth: 1923/07/22

## 2016-06-09 NOTE — Patient Instructions (Addendum)
Try new powersleeve over Reidsleeve  Cont with daytime  And pump until Rep fit with new one

## 2016-06-16 ENCOUNTER — Encounter: Payer: Self-pay | Admitting: Family Medicine

## 2016-06-16 ENCOUNTER — Telehealth: Payer: Self-pay | Admitting: Family Medicine

## 2016-06-16 ENCOUNTER — Ambulatory Visit (INDEPENDENT_AMBULATORY_CARE_PROVIDER_SITE_OTHER): Payer: Medicare Other | Admitting: Family Medicine

## 2016-06-16 DIAGNOSIS — S51819A Laceration without foreign body of unspecified forearm, initial encounter: Secondary | ICD-10-CM

## 2016-06-16 NOTE — Telephone Encounter (Signed)
FYI: Pt has appt with Dr. Lacinda Axon today at 4:15

## 2016-06-16 NOTE — Progress Notes (Signed)
Pre visit review using our clinic review tool, if applicable. No additional management support is needed unless otherwise documented below in the visit note. 

## 2016-06-16 NOTE — Telephone Encounter (Signed)
[facility]  Patient Name: Jason Wade  DOB: 05-26-1923    Initial Comment Father has a scratch on his arm that has lymphedema, wants to know if it can be looked at.    Nurse Assessment  Nurse: Wynetta Emery, RN, Baker Janus Date/Time Eilene Ghazi Time): 06/16/2016 8:43:16 AM  Confirm and document reason for call. If symptomatic, describe symptoms. ---Bishoy scratched on right arm from shower door on Wednesday or Thursday evening and needs the scratch on arm looked at by a MD  Does the patient have any new or worsening symptoms? ---Yes  Will a triage be completed? ---Yes  Related visit to physician within the last 2 weeks? ---No  Does the PT have any chronic conditions? (i.e. diabetes, asthma, etc.) ---No  Is this a behavioral health or substance abuse call? ---No     Guidelines    Guideline Title Affirmed Question Affirmed Notes  Cuts and Lacerations [1] Looks infected (spreading redness, pus) AND [2] no fever    Final Disposition User   See Physician within 24 Hours [place], RN, Baker Janus    Comments  NOTE: STATES SHE already has a 415pm appt with office.   Referrals  REFERRED TO PCP OFFICE   Disagree/Comply: Comply

## 2016-06-16 NOTE — Telephone Encounter (Signed)
Pt daughter called about pt left arm is bleeding that has the lymphoedema in it. I transferred pt to team health due to the bleeding. Pt is scheduled to come in today at 4:15pm. Thank you!

## 2016-06-16 NOTE — Telephone Encounter (Signed)
Patient scrapped right arm on door on Thursday, and has had watery weeping with tinged pink, patient daughter stated that he will not take off the compression sleeve today and let her view the wound, but patient daughter reported on Saturday when she saw the wound there was no redness surrounding the wound.  Patient has c/o pain at wound site. Has an appointment with PCP  At 4:15 today any additional orders or information needed?

## 2016-06-16 NOTE — Telephone Encounter (Signed)
FYI on 4:15 p.m.

## 2016-06-17 ENCOUNTER — Telehealth: Payer: Self-pay | Admitting: Family Medicine

## 2016-06-17 DIAGNOSIS — S51819A Laceration without foreign body of unspecified forearm, initial encounter: Secondary | ICD-10-CM | POA: Insufficient documentation

## 2016-06-17 NOTE — Progress Notes (Signed)
   Subjective:  Patient ID: Jason Wade, male    DOB: 1923-10-19  Age: 83 y.o. MRN: [id number]  CC: Skin tear  HPI:  83 year old male with extensive past medical history presents with the above complaint.  Patient states that on Thursday he was getting out of the shower and injured his right forearm. He suffered a skin tear. The area has been draining clear fluid. No associated redness. No fevers or chills. This is quite troublesome to him especially as the affected arm has baseline lymphedema from prior lymph node removal. He's been dressing the wound. No other medications or interventions tried. No other complaints or concerns at this time.  Social Hx   Social History   Social History  . Marital status: Married    Spouse name: N/A  . Number of children: N/A  . Years of education: N/A   Social History Main Topics  . Smoking status: Never Smoker  . Smokeless tobacco: Never Used  . Alcohol use No  . Drug use: No  . Sexual activity: Yes   Other Topics Concern  . None   Social History Narrative  . None    Review of Systems  Constitutional: Negative.   Skin: Positive for wound.   Objective:  BP (!) 164/78   Pulse 60   Temp 97.6 F (36.4 C) (Oral)   Wt 168 lb 3.2 oz (76.3 kg)   SpO2 97%   BMI 27.99 kg/m   BP/Weight 06/16/2016 04/07/2016 [id number]  Systolic BP 123456 123456 123456  Diastolic BP 78 67 72  Wt. (Lbs) 168.2 170.38 166.23  BMI 27.99 28.35 27.66   Physical Exam  Constitutional: He is oriented to person, place, and time. He appears well-developed. No distress.  Pulmonary/Chest: Effort normal.  Neurological: He is alert and oriented to person, place, and time.  Skin:  Right forearm - quarter sized skin tear noted. Serosanguineous discharge noted. No erythema.  Psychiatric: He has a normal mood and affect.  Vitals reviewed.  Lab Results  Component Value Date   WBC 6.4 03/06/2016   HGB 13.1 03/06/2016   HCT 38.6 (L) 03/06/2016   PLT 223 03/06/2016   GLUCOSE 92 03/06/2016   CHOL 95 06/04/2015   TRIG 70.0 06/04/2015   HDL 30.20 (L) 06/04/2015   LDLCALC 51 06/04/2015   ALT 12 (L) 01/09/2016   AST 27 01/09/2016   NA 136 03/06/2016   K 4.0 03/06/2016   CL 102 03/06/2016   CREATININE 1.30 (H) 03/06/2016   BUN 37 (H) 03/06/2016   CO2 29 03/06/2016   TSH 1.76 07/19/2015   INR 1.1 06/02/2013    Assessment & Plan:   Problem List Items Addressed This Visit    Skin tear of forearm without complication, initial encounter    New problem. Uncomplicated. Already starting to heal.  No evidence of infection. Steri-Strips applied. Wound was covered with Tegaderm. Patient was given supplies to continue to dress wound. No indication for further intervention at this time.        Follow-up: PRN  [place]

## 2016-06-17 NOTE — Telephone Encounter (Signed)
Called pts daughter per DPR. Was unable to leave voicemail due to it being full.

## 2016-06-17 NOTE — Telephone Encounter (Signed)
I don't think its needed. Just continue to change the bandage often.

## 2016-06-17 NOTE — Telephone Encounter (Signed)
Pt daughter called and had a couple of questions in regards to pt's wound care. Please advise, thank you!

## 2016-06-17 NOTE — Telephone Encounter (Signed)
pts daughter was called back and she stated that she had read online about Zinc and material you find in baby diapers can be useful with the clear fluid coming out of the wound. She was told to change bandage up to three times daily if not more depending on the saturation of the bandage. Pt daughter wanted to know what you thought and she would change bandage. Please advise?

## 2016-06-17 NOTE — Assessment & Plan Note (Signed)
New problem. Uncomplicated. Already starting to heal.  No evidence of infection. Steri-Strips applied. Wound was covered with Tegaderm. Patient was given supplies to continue to dress wound. No indication for further intervention at this time.

## 2016-06-18 NOTE — Telephone Encounter (Signed)
Daughter was called and given PCP information

## 2016-07-27 ENCOUNTER — Other Ambulatory Visit: Payer: Self-pay | Admitting: Cardiovascular Disease

## 2016-08-07 NOTE — Progress Notes (Addendum)
Cardiology Office Note  Date:  08/08/2016   ID:  Jason Wade, DOB 04-11-1924, MRN [id number]  PCP:  Jason Spikes, DO   Chief Complaint  Patient presents with  . other    6 month f/u c/o sob. Meds reviewed verbally with pt.    HPI:  Jason Wade is a 83 year old gentleman with a history of  PAD with carotid endarterectomy on the right squamous cell cancer, resection, recurrence with long course of XRT, s/p chemotherapy,  aortic valve stenosis, moderate on echocardiogram August 2017 Mean gradient 29 mmHg, peak velocity 337 paroxysmal atrial fibrillation/flutter  who presents for routine follow-up of his atrial fibrillation  Chronic Right arm lymphedema, had swelling, blistering He presents today with several family members including his daughter and son  In follow-up he reports having some shortness of breath Takes Lasix every other day Denies having significant lower extremity edema Denies having significant cough Weight is stable  Previously going to the gym, 2x per week wed/Fri Continues to walk periodically with a cane Denies any significant tachycardia  Lots of questions concerning his breathing, aortic valve stenosis, atrial fibrillation He is tolerating anticoagulation  EKG on today's visit shows atrial fibrillation,  nonspecific ST and T wave abnormality V4 through V6, 1 and aVL  Other past medical history reviewed  carotid endarterectomy on the right in February 2015  History of lymphedema in his right arm and has compression therapy on the arm by regular basis. Previous surgery with lymph node resection on the right.   Previous fall in November 2013 as he walked at [employer]. He cut his head and needed several stitches. Since then his balance has been adequate though he uses a cane. He has a pinched nerve in his leg, chronic neuropathy.    PMH:   has a past medical history of BPH (benign prostatic hyperplasia); Carotid arterial disease ([place]); Chronic  diastolic CHF (congestive heart failure) ([place]); Chronic low back pain; CKD (chronic kidney disease), stage II; Essential hypertension; Falls; Gout; HLD (hyperlipidemia); Hypothyroidism; Lower extremity neuropathy; PAF (paroxysmal atrial fibrillation) ([place]); Severe aortic stenosis; and Squamous cell carcinoma of skin.  PSH:    Past Surgical History:  Procedure Laterality Date  . CARDIAC CATHETERIZATION  04-08-2005  . KNEE SURGERY  02-09-2007   replacement  . LAMINECTOMY AND MICRODISCECTOMY LUMBAR SPINE      Bilateral  at L4-5 followed by L4-5 right microdiskectomy.    Current Outpatient Prescriptions  Medication Sig Dispense Refill  . cloNIDine (CATAPRES) 0.3 MG tablet Take 0.3 mg by mouth 2 (two) times daily.    . furosemide (LASIX) 20 MG tablet Take 1 tablet (20 mg total) by mouth daily as needed. 90 tablet 3  . hydrochlorothiazide (HYDRODIURIL) 25 MG tablet TAKE 1 TABLET (25 MG TOTAL) BY MOUTH DAILY. 90 tablet 3  . metoprolol succinate (TOPROL-XL) 100 MG 24 hr tablet TAKE 1 TABLET DAILY TAKE   WITH OR IMMEDIATELY        FOLLOWING A MEAL 90 tablet 3  . simvastatin (ZOCOR) 20 MG tablet TAKE 1 TABLET AT BEDTIME 90 tablet 2  . SYNTHROID 100 MCG tablet TAKE 1 TABLET DAILY 90 tablet 3  . terazosin (HYTRIN) 2 MG capsule TAKE 1 CAPSULE AT BEDTIME 90 capsule 2  . XARELTO 20 MG TABS tablet TAKE 1 TABLET DAILY WITH   SUPPER 90 tablet 3   No current facility-administered medications for this visit.      Allergies:   Patient has no known allergies.  Social History:  The patient  reports that he has never smoked. He has never used smokeless tobacco. He reports that he does not drink alcohol or use drugs.   Family History:   family history includes Hypertension in his mother; Stroke in his mother.    Review of Systems: Review of Systems  Constitutional: Negative.        Weight gain  Respiratory: Positive for shortness of breath.   Cardiovascular: Negative.   Gastrointestinal: Negative.    Musculoskeletal: Negative.   Neurological: Negative.   Psychiatric/Behavioral: Negative.   All other systems reviewed and are negative.    PHYSICAL EXAM: VS:  BP 122/70 (BP Location: Left Arm, Patient Position: Sitting, Cuff Size: Normal)   Pulse 74   Ht 5\' 5"  (1.651 m)   Wt 171 lb (77.6 kg)   BMI 28.46 kg/m  , BMI Body mass index is 28.46 kg/m. GEN: Well nourished, well developed, in no acute distress  HEENT: normal  Neck: no JVD, carotid bruits, or masses Cardiac: Irregularly irregular , 3/6 SEM RSB,  No rubs, or gallops,no edema  Respiratory:  dullness at the left base one third up,  normal work of breathing GI: soft, nontender, nondistended, + BS MS: no deformity or atrophy  Skin: warm and dry, no rash Neuro:  Strength and sensation are intact Psych: euthymic mood, full affect    Recent Labs: 12/06/2015: Pro B Natriuretic peptide (BNP) 2,175.0 01/09/2016: ALT 12; B Natriuretic Peptide 1,763.0 03/06/2016: BUN 37; Creatinine, Ser 1.30; Hemoglobin 13.1; Platelets 223; Potassium 4.0; Sodium 136    Lipid Panel Lab Results  Component Value Date   CHOL 95 06/04/2015   HDL 30.20 (L) 06/04/2015   LDLCALC 51 06/04/2015   TRIG 70.0 06/04/2015      Wt Readings from Last 3 Encounters:  08/08/16 171 lb (77.6 kg)  06/16/16 168 lb 3.2 oz (76.3 kg)  04/07/16 170 lb 6 oz (77.3 kg)       ASSESSMENT AND PLAN:  PAF (paroxysmal atrial fibrillation) (HCC) - Plan: EKG 12-Lead Rate well controlled, tolerating anticoagulation  Aortic valve stenosis At least moderate August 2017 Repeat echocardiogram ordered August 2018 Long discussion with him and family concerning various treatment options. He has indicated he does not want surgery, he might be open to TAVR  Pleural effusion Clinical exam suggestive of pleural effusion on the left possibly causing his shortness of breath. This will be from chronic diastolic CHF. Chest x-rays ordered for today, he is in agreement Recommended  he take extra Lasix periodically for shortness of breath symptoms  Chronic diastolic CHF (congestive heart failure) (HCC) Currently taking Lasix every other day  BMP ordered for today Chest x-ray today Take extra Lasix as needed for shortness of breath  Essential hypertension Blood pressure is well controlled on today's visit. No changes made to the medications.  Pure hypercholesterolemia Blood pressure is well controlled on today's visit. No changes made to the medications.   Total encounter time more than 45 minutes  Greater than 50% was spent in counseling and coordination of care with the patient   Disposition:   F/U  6 months   Orders Placed This Encounter  Procedures  . DG Chest 2 View  . Basic Metabolic Panel (BMET)  . Hepatic function panel  . Lipid Profile  . EKG 12-Lead  . ECHOCARDIOGRAM COMPLETE     Signed, Esmond Plants, M.D., Ph.D. 08/08/2016  [place], [place]

## 2016-08-08 ENCOUNTER — Ambulatory Visit
Admission: RE | Admit: 2016-08-08 | Discharge: 2016-08-08 | Disposition: A | Discharge status: Home / Self Care | Payer: Medicare Other | Source: Ambulatory Visit | Attending: Cardiovascular Disease | Admitting: Cardiovascular Disease

## 2016-08-08 ENCOUNTER — Ambulatory Visit (INDEPENDENT_AMBULATORY_CARE_PROVIDER_SITE_OTHER): Payer: Medicare Other | Admitting: Cardiovascular Disease

## 2016-08-08 ENCOUNTER — Other Ambulatory Visit
Admission: RE | Admit: 2016-08-08 | Discharge: 2016-08-08 | Disposition: A | Discharge status: Home / Self Care | Payer: Medicare Other | Source: Ambulatory Visit | Attending: Cardiovascular Disease | Admitting: Cardiovascular Disease

## 2016-08-08 ENCOUNTER — Telehealth: Payer: Self-pay | Admitting: Cardiovascular Disease

## 2016-08-08 ENCOUNTER — Encounter: Payer: Self-pay | Admitting: Cardiovascular Disease

## 2016-08-08 VITALS — BP 122/70 | HR 74 | Ht 65.0 in | Wt 171.0 lb

## 2016-08-08 DIAGNOSIS — J9811 Atelectasis: Secondary | ICD-10-CM | POA: Diagnosis not present

## 2016-08-08 DIAGNOSIS — I779 Disorder of arteries and arterioles, unspecified: Secondary | ICD-10-CM

## 2016-08-08 DIAGNOSIS — N182 Chronic kidney disease, stage 2 (mild): Secondary | ICD-10-CM | POA: Insufficient documentation

## 2016-08-08 DIAGNOSIS — I480 Paroxysmal atrial fibrillation: Secondary | ICD-10-CM

## 2016-08-08 DIAGNOSIS — J90 Pleural effusion, not elsewhere classified: Secondary | ICD-10-CM | POA: Insufficient documentation

## 2016-08-08 DIAGNOSIS — I350 Nonrheumatic aortic (valve) stenosis: Secondary | ICD-10-CM

## 2016-08-08 DIAGNOSIS — I5032 Chronic diastolic (congestive) heart failure: Secondary | ICD-10-CM | POA: Diagnosis present

## 2016-08-08 DIAGNOSIS — I739 Peripheral vascular disease, unspecified: Secondary | ICD-10-CM

## 2016-08-08 LAB — BASIC METABOLIC PANEL
Anion gap: 10 (ref 5–15)
BUN: 40 mg/dL — AB (ref 6–20)
CALCIUM: 9 mg/dL (ref 8.9–10.3)
CHLORIDE: 101 mmol/L (ref 101–111)
CO2: 27 mmol/L (ref 22–32)
CREATININE: 1.35 mg/dL — AB (ref 0.61–1.24)
GFR calc non Af Amer: 45 mL/min — ABNORMAL LOW (ref >60)
GFR, EST AFRICAN AMERICAN: 52 mL/min — AB (ref >60)
Glucose, Bld: 97 mg/dL (ref 65–99)
Potassium: 3.6 mmol/L (ref 3.5–5.1)
SODIUM: 138 mmol/L (ref 135–145)

## 2016-08-08 LAB — HEPATIC FUNCTION PANEL
ALBUMIN: 3.4 g/dL — AB (ref 3.5–5.0)
ALK PHOS: 44 U/L (ref 38–126)
ALT: 10 U/L — AB (ref 17–63)
AST: 26 U/L (ref 15–41)
Bilirubin, Direct: 0.2 mg/dL (ref 0.1–0.5)
Indirect Bilirubin: 0.5 mg/dL (ref 0.3–0.9)
TOTAL PROTEIN: 6.8 g/dL (ref 6.5–8.1)
Total Bilirubin: 0.7 mg/dL (ref 0.3–1.2)

## 2016-08-08 LAB — LIPID PANEL
CHOL/HDL RATIO: 2.7 ratio
Cholesterol: 97 mg/dL (ref 0–200)
HDL: 36 mg/dL — ABNORMAL LOW (ref >40)
LDL Cholesterol: 53 mg/dL (ref 0–99)
Triglycerides: 41 mg/dL (ref <150)
VLDL: 8 mg/dL (ref 0–40)

## 2016-08-08 MED ORDER — FUROSEMIDE 20 MG PO TABS: 20.0000 mg | ORAL_TABLET | Freq: Every day | ORAL | 3 refills | Status: DC

## 2016-08-08 MED ORDER — POTASSIUM CHLORIDE ER 10 MEQ PO TBCR: 10.0000 meq | EXTENDED_RELEASE_TABLET | Freq: Every day | ORAL | 3 refills | Status: DC

## 2016-08-08 NOTE — Telephone Encounter (Signed)
Pt daughter would like chest xray results and labs resutls. Please call.

## 2016-08-08 NOTE — Patient Instructions (Addendum)
Medication Instructions:   No medication changes made Take extra lasix for shortness of breath  Labwork:  BMP , liver and lipid today  Testing/Procedures:  CXR for pleural effusion Please proceed to the [facility] for chest x-ray  Echocardiogram in august for aortic valve stenosis Echocardiography is a painless test that uses sound waves to create images of your heart. It provides your doctor with information about the size and shape of your heart and how well your heart's chambers and valves are working. This procedure takes approximately one hour. There are no restrictions for this procedure.   I recommend watching educational videos on topics of interest to you at:       [email or web address].com  Enter code: [email or web address]    Follow-Up: It was a pleasure seeing you in the office today. Please call us if you have new issues that need to be addressed before your next appt.  [phone]  Your physician wants you to follow-up in: 6 months.  You will receive a reminder letter in the mail two months in advance. If you don't receive a letter, please call our office to schedule the follow-up appointment.  If you need a refill on your cardiac medications before your next appointment, please call your pharmacy.    Echocardiogram An echocardiogram, or echocardiography, uses sound waves (ultrasound) to produce an image of your heart. The echocardiogram is simple, painless, obtained within a short period of time, and offers valuable information to your health care provider. The images from an echocardiogram can provide information such as:  Evidence of coronary artery disease (CAD).  Heart size.  Heart muscle function.  Heart valve function.  Aneurysm detection.  Evidence of a past heart attack.  Fluid buildup around the heart.  Heart muscle thickening.  Assess heart valve function. Tell a health care provider about:  Any allergies you have.  All medicines you are taking,  including vitamins, herbs, eye drops, creams, and over-the-counter medicines.  Any problems you or family members have had with anesthetic medicines.  Any blood disorders you have.  Any surgeries you have had.  Any medical conditions you have.  Whether you are pregnant or may be pregnant. What happens before the procedure? No special preparation is needed. Eat and drink normally. What happens during the procedure?  In order to produce an image of your heart, gel will be applied to your chest and a wand-like tool (transducer) will be moved over your chest. The gel will help transmit the sound waves from the transducer. The sound waves will harmlessly bounce off your heart to allow the heart images to be captured in real-time motion. These images will then be recorded.  You may need an IV to receive a medicine that improves the quality of the pictures. What happens after the procedure? You may return to your normal schedule including diet, activities, and medicines, unless your health care provider tells you otherwise. This information is not intended to replace advice given to you by your health care provider. Make sure you discuss any questions you have with your health care provider. Document Released: 04/04/2000 Document Revised: 11/24/2015 Document Reviewed: 12/13/2012 Elsevier Interactive Patient Education  2017 [employer].

## 2016-08-08 NOTE — Telephone Encounter (Signed)
Left detailed message on Jason Wade's vm w/ Dr. [name] recommendation.  Asked her to call back w/ any questions or concerns.

## 2016-08-08 NOTE — Telephone Encounter (Signed)
Chest x-ray shows moderate sized left pleural effusion Consistent with the fluid at the bottom of the left lung heard in the clinic  Would recommend he go up to Lasix daily  Renal function stable, Would add potassium 10 mEq with each Lasix We can send in a prescription  We will need to monitor the effusion size If there is no significant improvement in the next week or so We can do thoracentesis where fluid can be drained out This is likely the reason for his breathing/shortness of breath  Would recommend repeat chest x-ray in 2 or 3 weeks

## 2016-08-08 NOTE — Addendum Note (Signed)
Addended by: Dede Query R on: 08/08/2016 05:13 PM   Modules accepted: Orders

## 2016-08-11 ENCOUNTER — Encounter: Payer: Self-pay | Admitting: Family Medicine

## 2016-08-11 ENCOUNTER — Ambulatory Visit (INDEPENDENT_AMBULATORY_CARE_PROVIDER_SITE_OTHER): Payer: Medicare Other | Admitting: Family Medicine

## 2016-08-11 DIAGNOSIS — E7800 Pure hypercholesterolemia, unspecified: Secondary | ICD-10-CM

## 2016-08-11 DIAGNOSIS — I10 Essential (primary) hypertension: Secondary | ICD-10-CM

## 2016-08-11 DIAGNOSIS — J90 Pleural effusion, not elsewhere classified: Secondary | ICD-10-CM

## 2016-08-11 NOTE — Patient Instructions (Signed)
Continue your meds.  Follow up in 3-6 months.  Take care  Dr. Angel Weedon  

## 2016-08-11 NOTE — Assessment & Plan Note (Signed)
New problem. Discussed continued Lasix versus thoracentesis. Patient elected for the former. Follow-up chest x-ray in 2 weeks.

## 2016-08-11 NOTE — Assessment & Plan Note (Signed)
At goal. Continue current medications. 

## 2016-08-11 NOTE — Assessment & Plan Note (Signed)
At goal. Continue simvastatin. 

## 2016-08-11 NOTE — Progress Notes (Signed)
Pre visit review using our clinic review tool, if applicable. No additional management support is needed unless otherwise documented below in the visit note. 

## 2016-08-11 NOTE — Progress Notes (Signed)
   Subjective:  Patient ID: Jason Wade, male    DOB: 1923-09-15  Age: 83 y.o. MRN: [id number]  CC: Follow up  HPI:  83 year old male with PAF, aortic valve stenosis, HTN, HLD, chronic diastolic CHF presents for follow up.  Pleural effusion  Recently noted by cardiology.  Confirmed with xray.  He is currently on lasix daily.  Urinating a lot, especially at night.  Still having SOB.   Hypertension  Stable on clonidine, Lasix, metoprolol, HCTZ.  Hyperlipidemia  At goal on simvastatin.  Social Hx   Social History   Social History  . Marital status: Married    Spouse name: N/A  . Number of children: N/A  . Years of education: N/A   Social History Main Topics  . Smoking status: Never Smoker  . Smokeless tobacco: Never Used  . Alcohol use No  . Drug use: No  . Sexual activity: Yes   Other Topics Concern  . None   Social History Narrative  . None    Review of Systems  Constitutional: Positive for fatigue.  Respiratory: Positive for shortness of breath.    Objective:  BP 136/80   Pulse 62   Temp 97.7 F (36.5 C) (Oral)   Wt 172 lb (78 kg)   SpO2 97%   BMI 28.62 kg/m   BP/Weight 08/11/2016 08/08/2016 4/92/0100  Systolic BP 712 197 588  Diastolic BP 80 70 78  Wt. (Lbs) 172 171 168.2  BMI 28.62 28.46 27.99   Physical Exam  Constitutional: He appears well-developed. No distress.  Cardiovascular: Normal rate and regular rhythm.   Murmur heard. Pulmonary/Chest:  L base with decreased breath sounds.  Neurological: He is alert.  Psychiatric: He has a normal mood and affect.  Vitals reviewed.   Lab Results  Component Value Date   WBC 6.4 03/06/2016   HGB 13.1 03/06/2016   HCT 38.6 (L) 03/06/2016   PLT 223 03/06/2016   GLUCOSE 97 08/08/2016   CHOL 97 08/08/2016   TRIG 41 08/08/2016   HDL 36 (L) 08/08/2016   LDLCALC 53 08/08/2016   ALT 10 (L) 08/08/2016   AST 26 08/08/2016   NA 138 08/08/2016   K 3.6 08/08/2016   CL 101 08/08/2016   CREATININE 1.35 (H) 08/08/2016   BUN 40 (H) 08/08/2016   CO2 27 08/08/2016   TSH 1.76 07/19/2015   INR 1.1 06/02/2013    Assessment & Plan:   Problem List Items Addressed This Visit    Pleural effusion, left    New problem. Discussed continued Lasix versus thoracentesis. Patient elected for the former. Follow-up chest x-ray in 2 weeks.      HLD (hyperlipidemia)    At goal. Continue simvastatin.      Essential hypertension    At goal. Continue current medications.        Follow-up: Return in about 2 weeks (around 08/25/2016).  [place]

## 2016-08-29 ENCOUNTER — Ambulatory Visit (INDEPENDENT_AMBULATORY_CARE_PROVIDER_SITE_OTHER): Payer: Medicare Other

## 2016-08-29 ENCOUNTER — Ambulatory Visit (INDEPENDENT_AMBULATORY_CARE_PROVIDER_SITE_OTHER): Payer: Medicare Other | Admitting: Family Medicine

## 2016-08-29 ENCOUNTER — Encounter: Payer: Self-pay | Admitting: Family Medicine

## 2016-08-29 VITALS — BP 138/80 | HR 53 | Temp 97.7°F | Wt 163.2 lb

## 2016-08-29 DIAGNOSIS — J90 Pleural effusion, not elsewhere classified: Secondary | ICD-10-CM

## 2016-08-29 DIAGNOSIS — M79674 Pain in right toe(s): Secondary | ICD-10-CM | POA: Diagnosis not present

## 2016-08-29 MED ORDER — PREDNISONE 10 MG PO TABS: ORAL_TABLET | ORAL | 0 refills | Status: DC

## 2016-08-29 NOTE — Patient Instructions (Signed)
Prednisone as prescribed.  Continue the medication.  Follow up in 3 months.  Take care  Dr. Lacinda Axon

## 2016-08-30 ENCOUNTER — Encounter: Payer: Self-pay | Admitting: Family Medicine

## 2016-08-30 DIAGNOSIS — M79674 Pain in right toe(s): Secondary | ICD-10-CM | POA: Insufficient documentation

## 2016-08-30 HISTORY — DX: Pain in right toe(s): M79.674

## 2016-08-30 NOTE — Progress Notes (Signed)
Subjective:  Patient ID: Jason Wade, male    DOB: 02-08-1924  Age: 83 y.o. MRN: [id number]  CC: Great toe pain/swelling; Follow up Pleural effusion  HPI:  83 year old male with diastolic CHF, CKD, HTN, HLD, aortic valve stenosis, afib presents with the above complaints.  Great toe pain/swelling  He states that he hit his great toe on his walker a few weeks ago.  Since then he has had signficant pain/swelling of his R great toe.  Has been worsening.  Pain is severe.   Worse with ROM/activity/ambulation.  No known relieving factors.   Pleural effusion  Patient recently found by cardiology to have a L pleural effusion.  He has been taking additional lasix and needs follow up xray today.  Patient declined thoracentesis previously.  Has had some improvement in SOB but still present.  Social Hx   Social History   Social History  . Marital status: Married    Spouse name: N/A  . Number of children: N/A  . Years of education: N/A   Social History Main Topics  . Smoking status: Never Smoker  . Smokeless tobacco: Never Used  . Alcohol use No  . Drug use: No  . Sexual activity: Yes   Other Topics Concern  . None   Social History Narrative  . None    Review of Systems  Respiratory: Positive for shortness of breath.   Musculoskeletal:       Right great toe pain/redness/swelling.    Objective:  BP 138/80   Pulse (!) 53   Temp 97.7 F (36.5 C) (Oral)   Wt 163 lb 3.2 oz (74 kg)   SpO2 97%   BMI 27.16 kg/m   BP/Weight 08/29/2016 08/11/2016 8/92/1194  Systolic BP 174 081 448  Diastolic BP 80 80 70  Wt. (Lbs) 163.2 172 171  BMI 27.16 28.62 28.46    Physical Exam  Constitutional: He is oriented to person, place, and time. He appears well-developed. No distress.  Cardiovascular: Normal rate and regular rhythm.   Murmur heard. Pulmonary/Chest: Effort normal.  Slightly decreased breath sounds - left base.  Neurological: He is alert and oriented to  person, place, and time.  Psychiatric: He has a normal mood and affect.  Vitals reviewed.   Lab Results  Component Value Date   WBC 6.4 03/06/2016   HGB 13.1 03/06/2016   HCT 38.6 (L) 03/06/2016   PLT 223 03/06/2016   GLUCOSE 97 08/08/2016   CHOL 97 08/08/2016   TRIG 41 08/08/2016   HDL 36 (L) 08/08/2016   LDLCALC 53 08/08/2016   ALT 10 (L) 08/08/2016   AST 26 08/08/2016   NA 138 08/08/2016   K 3.6 08/08/2016   CL 101 08/08/2016   CREATININE 1.35 (H) 08/08/2016   BUN 40 (H) 08/08/2016   CO2 27 08/08/2016   TSH 1.76 07/19/2015   INR 1.1 06/02/2013    Assessment & Plan:   Problem List Items Addressed This Visit    Great toe pain, right - Primary    New problem. Xray negative. Clinically appears to be secondary to gout. Treating with Prednisone.      Relevant Orders   DG Toe Great Right (Completed)   Pleural effusion, left    Follow up xray with persistent effusion (stable). Patient to consider thoracentesis.      Relevant Orders   DG Chest 2 View (Completed)      Meds ordered this encounter  Medications  . predniSONE (DELTASONE) 10  MG tablet    Sig: 5 tablets daily x 3 days, then 4 tablets daily x 3 days, then 3 tablets daily x 3 days, then 2 tablets daily x 3 days, then 1 tablet daily x 3 days.    Dispense:  45 tablet    Refill:  0    Follow-up: 3 months  [place] DO [facility]

## 2016-08-30 NOTE — Assessment & Plan Note (Signed)
New problem. Xray negative. Clinically appears to be secondary to gout. Treating with Prednisone.

## 2016-08-30 NOTE — Assessment & Plan Note (Signed)
Follow up xray with persistent effusion (stable). Patient to consider thoracentesis.

## 2016-09-01 ENCOUNTER — Other Ambulatory Visit: Payer: Self-pay | Admitting: Family Medicine

## 2016-09-01 ENCOUNTER — Telehealth: Payer: Self-pay | Admitting: Family Medicine

## 2016-09-01 DIAGNOSIS — J90 Pleural effusion, not elsewhere classified: Secondary | ICD-10-CM

## 2016-09-01 NOTE — Telephone Encounter (Signed)
Pt saw Dr. Lacinda Axon on Friday. Pt has decided he does want the fluid removed from his lungs. Please call his daughter [name] at [phone], can leave a vm on this number.

## 2016-09-02 ENCOUNTER — Other Ambulatory Visit: Payer: Self-pay | Admitting: Family Medicine

## 2016-09-02 DIAGNOSIS — J90 Pleural effusion, not elsewhere classified: Secondary | ICD-10-CM

## 2016-09-02 NOTE — Progress Notes (Signed)
i

## 2016-09-03 ENCOUNTER — Encounter: Payer: Self-pay | Admitting: Family Medicine

## 2016-09-03 NOTE — Telephone Encounter (Signed)
Patient's daughter stated that pt did want to have the fluid removed from his legs. Patient is now wanting to have this completed.  Contact Debbie [phone]

## 2016-09-03 NOTE — Telephone Encounter (Signed)
Can you help schedule pt on Friday. This referral is place, however the daughter will need this on Friday or a day next week.

## 2016-09-07 ENCOUNTER — Encounter: Payer: Self-pay | Admitting: Cardiovascular Disease

## 2016-09-10 ENCOUNTER — Ambulatory Visit
Admission: RE | Admit: 2016-09-10 | Discharge: 2016-09-10 | Disposition: A | Discharge status: Home / Self Care | Payer: Medicare Other | Source: Ambulatory Visit | Attending: Interventional Radiology | Admitting: Interventional Radiology

## 2016-09-10 ENCOUNTER — Telehealth: Payer: Self-pay | Admitting: Family Medicine

## 2016-09-10 ENCOUNTER — Ambulatory Visit
Admission: RE | Admit: 2016-09-10 | Discharge: 2016-09-10 | Disposition: A | Discharge status: Home / Self Care | Payer: Medicare Other | Source: Ambulatory Visit | Attending: Family Medicine | Admitting: Family Medicine

## 2016-09-10 DIAGNOSIS — J90 Pleural effusion, not elsewhere classified: Secondary | ICD-10-CM | POA: Insufficient documentation

## 2016-09-10 DIAGNOSIS — Z9889 Other specified postprocedural states: Secondary | ICD-10-CM

## 2016-09-10 LAB — BODY FLUID CELL COUNT WITH DIFFERENTIAL
Eos, Fluid: 0 %
LYMPHS FL: 16 %
Monocyte-Macrophage-Serous Fluid: 32 %
NEUTROPHIL FLUID: 52 %
Other Cells, Fluid: 0 %
WBC FLUID: 77 uL

## 2016-09-10 LAB — GLUCOSE, PLEURAL OR PERITONEAL FLUID: GLUCOSE FL: 116 mg/dL

## 2016-09-10 LAB — PROTEIN, PLEURAL OR PERITONEAL FLUID: Total protein, fluid: <3 g/dL

## 2016-09-10 LAB — LACTATE DEHYDROGENASE, PLEURAL OR PERITONEAL FLUID: LD, Fluid: 43 U/L — ABNORMAL HIGH (ref 3–23)

## 2016-09-10 NOTE — Telephone Encounter (Addendum)
Patients daughter [name]  advised you were out of office .  See below message .

## 2016-09-10 NOTE — Telephone Encounter (Signed)
Continue Lasix 

## 2016-09-10 NOTE — Procedures (Signed)
Left thoracentesis 800 cc EBL 0 CXR pending

## 2016-09-10 NOTE — Telephone Encounter (Signed)
Pt daughter called and stated that pt has finished his thoracentesis and feels great they just need to know if he needs to stop taking the lasix. Please advise, thank you!  Call Debra @ [phone]

## 2016-09-11 ENCOUNTER — Other Ambulatory Visit
Admission: RE | Admit: 2016-09-11 | Discharge: 2016-09-11 | Disposition: A | Discharge status: Home / Self Care | Payer: Medicare Other | Source: Ambulatory Visit | Attending: Cardiovascular Disease | Admitting: Cardiovascular Disease

## 2016-09-11 ENCOUNTER — Telehealth: Payer: Self-pay | Admitting: Cardiovascular Disease

## 2016-09-11 ENCOUNTER — Ambulatory Visit
Admission: RE | Admit: 2016-09-11 | Discharge: 2016-09-11 | Disposition: A | Discharge status: Home / Self Care | Payer: Medicare Other | Source: Ambulatory Visit | Attending: Cardiovascular Disease | Admitting: Cardiovascular Disease

## 2016-09-11 DIAGNOSIS — J948 Other specified pleural conditions: Secondary | ICD-10-CM | POA: Insufficient documentation

## 2016-09-11 DIAGNOSIS — J90 Pleural effusion, not elsewhere classified: Secondary | ICD-10-CM | POA: Insufficient documentation

## 2016-09-11 LAB — PH, BODY FLUID: pH, Body Fluid: 8

## 2016-09-11 LAB — MISC LABCORP TEST (SEND OUT): Labcorp test code: 19588

## 2016-09-11 NOTE — Telephone Encounter (Signed)
Spoke with patients daughter and she wanted to know if he needed to continue lasix since he had his thoracentesis. Reviewed with her that per Dr. Lacinda Axon he recommended cutting back to every other day and checking with cardiology. She states that she is just concerned about his frequent trips to the restroom and is fearful that he may fall having to go so frequently. Let her know that I would route message to Dr. Rockey Situ and if he had any recommendations we would give her a call back. She stated for [place] to call her home number and leave a detailed voicemail message because her cell phone is full and she does not know how to get the messages. Confirmed number and she was appreciative for the call. She verbalized understanding of our conversation and had no further questions at this time.

## 2016-09-11 NOTE — Telephone Encounter (Signed)
Patient had same day thoracentesis at [facility] and daughter wants to know if he has to continue taking lasix  Please contact to discuss.

## 2016-09-11 NOTE — Telephone Encounter (Signed)
Hilda Blades daughter advised of that patient could take lasix every other day.  He would hear from Dr [name] office in regards to his input.

## 2016-09-11 NOTE — Telephone Encounter (Signed)
Patients daughter advised of below , she was wondering if her dad could take Lasix every other day.  She states he is feeling much better.   She states  he is having to go to the bathroom so frequently and already has kidney disease. I advised her of directions per medication list for Lasix.   Please advise.

## 2016-09-11 NOTE — Telephone Encounter (Signed)
He can take every other day. Should ask Gollan's input as well. Forwarding.

## 2016-09-11 NOTE — Telephone Encounter (Signed)
Tried calling daughter [name] voicemail full.

## 2016-09-12 LAB — CYTOLOGY - NON PAP

## 2016-09-12 NOTE — Telephone Encounter (Signed)
Lasix every other day This was sent in a previous note

## 2016-09-12 NOTE — Telephone Encounter (Signed)
See other telephone note. Reviewed with daughter that Dr. Rockey Situ was agreeable to every other day on the lasix.

## 2016-09-12 NOTE — Telephone Encounter (Signed)
Reviewed with patients daughter that per Dr. Rockey Situ it is ok for him to take the lasix every other day. She verbalized understanding with no further questions at this time.

## 2016-09-12 NOTE — Telephone Encounter (Signed)
Left voicemail message that per Dr. Rockey Situ it is ok to take the lasix every other day and to call back if any further questions.

## 2016-09-12 NOTE — Telephone Encounter (Signed)
I think every other day should be fine thx TG

## 2016-09-14 LAB — BODY FLUID CULTURE: CULTURE: NO GROWTH

## 2016-09-14 LAB — CHOLESTEROL, BODY FLUID: Cholesterol, Fluid: 12 mg/dL

## 2016-09-15 ENCOUNTER — Other Ambulatory Visit: Payer: Self-pay | Admitting: Family Medicine

## 2016-09-16 ENCOUNTER — Telehealth: Payer: Self-pay | Admitting: Family Medicine

## 2016-09-16 ENCOUNTER — Other Ambulatory Visit: Payer: Self-pay | Admitting: Family Medicine

## 2016-09-16 MED ORDER — COLCHICINE 0.6 MG PO TABS: ORAL_TABLET | ORAL | 1 refills | Status: DC

## 2016-09-16 MED ORDER — ALLOPURINOL 100 MG PO TABS: 100.0000 mg | ORAL_TABLET | Freq: Every day | ORAL | 1 refills | Status: DC

## 2016-09-16 NOTE — Telephone Encounter (Signed)
Pt daughter called about gout set in his toe. She states pt needs to be back on the prednisone. Very painful. Same issue from 2 weeks ago. Please advise?  Call daughter @ [phone]. Thank you!

## 2016-09-16 NOTE — Telephone Encounter (Signed)
I am going to put him on colchicine instead of prednisone. Also starting on allopurinol.

## 2016-09-16 NOTE — Telephone Encounter (Signed)
Spoke with the patients daughter, she just got the call from the pharmacy and she will pick up the new Prescriptions.  She will call back to schedule an appt in 2-4 weeks for follow up. thanks

## 2016-09-16 NOTE — Telephone Encounter (Signed)
Last filled 08/29/16, treated for gout  lov  08/29/16 Please advise

## 2016-09-16 NOTE — Telephone Encounter (Signed)
Pt called back and stated that he is really having a hard time even trying to walk. Please advise, thank you!  Call pt @ [phone]

## 2016-09-16 NOTE — Telephone Encounter (Signed)
Please advise, thanks.

## 2016-10-02 ENCOUNTER — Ambulatory Visit (INDEPENDENT_AMBULATORY_CARE_PROVIDER_SITE_OTHER): Payer: Medicare Other

## 2016-10-02 ENCOUNTER — Ambulatory Visit (INDEPENDENT_AMBULATORY_CARE_PROVIDER_SITE_OTHER): Payer: Medicare Other | Admitting: Family Medicine

## 2016-10-02 ENCOUNTER — Encounter: Payer: Self-pay | Admitting: Family Medicine

## 2016-10-02 VITALS — BP 138/76 | HR 73 | Temp 98.7°F | Resp 16 | Ht 65.0 in | Wt 165.5 lb

## 2016-10-02 DIAGNOSIS — J90 Pleural effusion, not elsewhere classified: Secondary | ICD-10-CM | POA: Diagnosis not present

## 2016-10-02 MED ORDER — FUROSEMIDE 20 MG PO TABS: 20.0000 mg | ORAL_TABLET | Freq: Every day | ORAL | 3 refills | Status: DC

## 2016-10-02 NOTE — Progress Notes (Signed)
Subjective:  Patient ID: Jason Wade, male    DOB: [birth year]  Age: 83 y.o. MRN: [id number]  CC: SOB  HPI:  83 year old male With paroxysmal A. fib, aortic stenosis, hypertension, chronic diastolic heart failure, gout, CKD, hyperlipidemia presents with the above complaint.  Shortness of breath  Patient reports that for the past few weeks she's had increasing shortness of breath.  He states that it started not too long after he had a thoracentesis.  Worsening shortness of breath especially with exertion. Moderate in severity.  Limiting activity.  He endorses compliance with lasix every other day.  No chest pain or other associated symptoms.  No other complaints at this time.  Social Hx   Social History   Social History  . Marital status: Married    Spouse name: N/A  . Number of children: N/A  . Years of education: N/A   Social History Main Topics  . Smoking status: Never Smoker  . Smokeless tobacco: Never Used  . Alcohol use No  . Drug use: No  . Sexual activity: Yes   Other Topics Concern  . None   Social History Narrative  . None    Review of Systems  Constitutional: Negative for fever.  Respiratory: Positive for shortness of breath.    Objective:  BP 138/76   Pulse 73   Temp 98.7 F (37.1 C) (Oral)   Resp 16   Ht 5\' 5"  (1.651 m)   Wt 165 lb 8 oz (75.1 kg)   SpO2 96%   BMI 27.54 kg/m   BP/Weight 10/02/2016 09/10/2016 7/84/6962  Systolic BP 952 841 324  Diastolic BP 76 76 80  Wt. (Lbs) 165.5 - 163.2  BMI 27.54 - 27.16    Physical Exam  Constitutional: He appears well-developed. No distress.  Cardiovascular: Normal rate and regular rhythm.   4-0/1 systolic murmur.   Pulmonary/Chest:  Mild increase work of breathing. Diminished breath sounds on the left.  Neurological: He is alert.  Psychiatric: He has a normal mood and affect.  Vitals reviewed.   Lab Results  Component Value Date   WBC 6.4 03/06/2016   HGB 13.1 03/06/2016   HCT 38.6 (L) 03/06/2016   PLT 223 03/06/2016   GLUCOSE 97 08/08/2016   CHOL 97 08/08/2016   TRIG 41 08/08/2016   HDL 36 (L) 08/08/2016   LDLCALC 53 08/08/2016   ALT 10 (L) 08/08/2016   AST 26 08/08/2016   NA 138 08/08/2016   K 3.6 08/08/2016   CL 101 08/08/2016   CREATININE 1.35 (H) 08/08/2016   BUN 40 (H) 08/08/2016   CO2 27 08/08/2016   TSH 1.76 07/19/2015   INR 1.1 06/02/2013   Dg Chest 2 View  Result Date: 10/02/2016 CLINICAL DATA:  Shortness of breath several weeks. EXAM: CHEST  2 VIEW COMPARISON:  09/11/2016. FINDINGS: A left hydropneumothorax is again demonstrated with a significant decrease in size of the pneumothorax component and significant increase in size of the pleural fluid component. Minimal left basilar atelectasis. Clear right lung. Grossly stable enlarged cardiac silhouette, with obscuration of the left heart borders currently demonstrated. Thoracic aortic arch calcifications. Mild thoracic spine degenerative changes. IMPRESSION: 1. Left hydropneumothorax with a significant decrease in size of the pneumothorax component and significant increase in size of the fluid component. There is a moderate to large fluid component now and a small air component. 2. Grossly stable cardiomegaly. Electronically Signed   By: Claudie Revering M.D.   On: 10/02/2016 14:27  Dg Chest 2 View  Result Date: 09/11/2016 CLINICAL DATA:  Pleural effusion EXAM: CHEST  2 VIEW COMPARISON:  09/10/2016 FINDINGS: Moderate-sized left pneumothorax is again noted, approximately 20-30%, unchanged. Layering left pleural effusion. Right lung is clear. Heart is borderline in size. IMPRESSION: Moderate left hydropneumothorax is stable since prior study. Electronically Signed   By: Rolm Baptise M.D.   On: 09/11/2016 10:51   Dg Chest Port 1 View  Result Date: 09/10/2016 CLINICAL DATA:  Post thoracentesis EXAM: PORTABLE CHEST 1 VIEW COMPARISON:  08/29/2016 FINDINGS: The heart remains mildly enlarged. After left  thoracentesis, a left pneumothorax has developed. It is approximately 20%. Overall lung aeration is unchanged. There is no shift of the mediastinum. Right lung is clear. No evidence of right pneumothorax. IMPRESSION: 20% left pneumothorax after thoracentesis. The patient is in no apparent distress. His dyspnea inability debris is markedly improved after thoracentesis. He was instructed to return if shortness of breath develops tonight. He will return tomorrow morning for a follow-up chest x-ray. Electronically Signed   By: Marybelle Killings M.D.   On: 09/10/2016 15:26   US Thoracentesis Asp Pleural Space W/img Guide  Result Date: 09/10/2016 INDICATION: Left pleural effusion EXAM: ULTRASOUND GUIDED LEFT THORACENTESIS MEDICATIONS: None. COMPLICATIONS: None immediate. PROCEDURE: An ultrasound guided thoracentesis was thoroughly discussed with the patient and questions answered. The benefits, risks, alternatives and complications were also discussed. The patient understands and wishes to proceed with the procedure. Written consent was obtained. Ultrasound was performed to localize and mark an adequate pocket of fluid in the left chest. The area was then prepped and draped in the normal sterile fashion. 1% Lidocaine was used for local anesthesia. Under ultrasound guidance a Safe-T-Centesis catheter was introduced. Thoracentesis was performed. The catheter was removed and a dressing applied. FINDINGS: A total of approximately 800 cc of clear yellow fluid was removed. Samples were sent to the laboratory as requested by the clinical team. IMPRESSION: Successful ultrasound guided left thoracentesis yielding 800 cc of pleural fluid. Post thoracentesis chest x-ray demonstrates a 20% left pneumothorax. The appearance is most likely due to inability of the lungs to re-expand. Evaluation of the patient reveals, that after the thoracentesis, is symptoms are markedly improved in he feels much better. He was instructed to return to  the hospital medially if shortness of breath develops. He will return tomorrow morning for a follow-up chest x-ray. Electronically Signed   By: Marybelle Killings M.D.   On: 09/10/2016 16:14     Assessment & Plan:   Problem List Items Addressed This Visit      Respiratory   Pleural effusion, left - Primary    Worsening/recurrent. Xray today revealed increase in size. Discussed with cardiology. Increasing lasix to 20 mg daily.  Will consult Gen Surg Dr. Genevive Bi to see if he is a candidate for Pleurx catheter.      Relevant Orders   DG Chest 2 View (Completed)      Meds ordered this encounter  Medications  . furosemide (LASIX) 20 MG tablet    Sig: Take 1 tablet (20 mg total) by mouth daily.    Dispense:  90 tablet    Refill:  3    Follow-up: 1-2 weeks  Thersa Salt DO [facility]

## 2016-10-02 NOTE — Assessment & Plan Note (Signed)
Worsening/recurrent. Xray today revealed increase in size. Discussed with cardiology. Increasing lasix to 20 mg daily.  Will consult Gen Surg Dr. Genevive Bi to see if he is a candidate for Pleurx catheter.

## 2016-10-02 NOTE — Patient Instructions (Signed)
Ill call you once I talk with Gollan.  Take your Lasix daily.  Take care  Dr. Lacinda Axon

## 2016-10-06 ENCOUNTER — Telehealth: Payer: Self-pay

## 2016-10-06 DIAGNOSIS — J90 Pleural effusion, not elsewhere classified: Secondary | ICD-10-CM

## 2016-10-06 NOTE — Telephone Encounter (Signed)
Called patient to let him know that we will need to see him on Friday 10/10/2016 at 10:15 AM for a Pleurx Catheter Insertion Consult. I also told patient that he will need to go to the [place] first around 9:30 AM before his appointment with [place]. Patient understood and had no further questions.   Cardiac and Coagulant Clearance to Dr. Rockey Situ for Xarelto.

## 2016-10-07 ENCOUNTER — Telehealth: Payer: Self-pay | Admitting: Family Medicine

## 2016-10-07 NOTE — Telephone Encounter (Signed)
Tried calling [employer] full. Left message with husband to have daughter to call.

## 2016-10-07 NOTE — Telephone Encounter (Signed)
I have arranged a consult with general surgery (per cardiology and myself) regarding the placement of a catheter to prevent build up of the fluid.

## 2016-10-07 NOTE — Telephone Encounter (Signed)
See message below daughter has question on referral to surgical office . Thanks.

## 2016-10-07 NOTE — Telephone Encounter (Signed)
Patients daughter advised of below and verbalized understanding.

## 2016-10-07 NOTE — Telephone Encounter (Signed)
Pt daughter called and wanted some clarification as to what the next step is for pt. She is a little confused because when she called the surgical office they were looking to make another pleural consult. Please advise, thank you!  Call pt @ [phone]

## 2016-10-08 ENCOUNTER — Telehealth: Payer: Self-pay | Admitting: Cardiovascular Disease

## 2016-10-08 ENCOUNTER — Telehealth: Payer: Self-pay

## 2016-10-08 NOTE — Telephone Encounter (Signed)
Received cardiac clearance request for pt to proceed w/ possible pleaurx catheter insertion w/ Dr. Genevive Bi.  DOS has not been scheduled yet.  Please route clearance to [place] @ [phone].

## 2016-10-08 NOTE — Telephone Encounter (Signed)
Cardiac and Anti-coagulant Clearance faxed to dr.Gollan at this time.

## 2016-10-09 NOTE — Telephone Encounter (Signed)
Routed to fax # provided. 

## 2016-10-09 NOTE — Telephone Encounter (Signed)
Acceptable risk for Pleurx catheter Would stop anticoagulation 2 days prior to procedure

## 2016-10-10 ENCOUNTER — Encounter: Payer: Self-pay | Admitting: Cardiothoracic Surgery

## 2016-10-10 ENCOUNTER — Ambulatory Visit
Admission: RE | Admit: 2016-10-10 | Discharge: 2016-10-10 | Disposition: A | Discharge status: Home / Self Care | Payer: Medicare Other | Source: Ambulatory Visit | Attending: Cardiothoracic Surgery | Admitting: Cardiothoracic Surgery

## 2016-10-10 ENCOUNTER — Ambulatory Visit (INDEPENDENT_AMBULATORY_CARE_PROVIDER_SITE_OTHER): Payer: 59 | Admitting: Cardiothoracic Surgery

## 2016-10-10 ENCOUNTER — Telehealth: Payer: Self-pay

## 2016-10-10 VITALS — BP 123/81 | HR 67 | Temp 97.8°F | Ht 65.0 in | Wt 165.0 lb

## 2016-10-10 DIAGNOSIS — J90 Pleural effusion, not elsewhere classified: Secondary | ICD-10-CM | POA: Diagnosis not present

## 2016-10-10 NOTE — Progress Notes (Signed)
Patient ID: Jason Wade, male   DOB: April 04, 1924, 83 y.o.   MRN: [id number]  Chief Complaint  Patient presents with  . Other    Pleural Effusion    Referred By Drs. Cook and [place] Reason for Referral Recurrent left pleural effusion  HPI Location, Quality, Duration, Severity, Timing, Context, Modifying Factors, Associated Signs and Symptoms.  Jason Wade is a 83 y.o. male.  He has enjoyed relatively good life over the years but more recently has had increasing shortness of breath as well as cardiovascular disease including a carotid endarterectomy congestive heart failure and atrial fibrillation. He is currently taking oral anticoagulants for his atrial fibrillation. In April of this year he was found to have a moderate sized left pleural effusion. He underwent a thoracentesis which resulted in a pneumothorax. The patient states that he felt much better after his thoracentesis. He had several follow-up x-rays for the pneumothorax and this had improved. He had another chest x-ray made today which shows a recurrence of his moderate size pleural effusion. He is here today to discuss management options. He does not really have any significant chest pain. He states he feels that there is occasional twinges of discomfort but no true angina or chest pain. He does get short of breath with minimal activities although he is able to be independent and lives alone. He cooks and cleans for himself. He is able to get dressed by himself. He does use a cane when he is out and about walking but at home is able to hold onto the furniture as he moves about. He does not use oxygen.   Past Medical History:  Diagnosis Date  . BPH (benign prostatic hyperplasia)   . Carotid arterial disease ([place])    a. 2015: s/p R CEA.  . Chronic diastolic CHF (congestive heart failure) ([place])    a. echo 11/2015: EF 55-60%, possible HK of inf wall, severe AS mean gradient 29, peak gradient 45, valve area 0.41, mild to mod MR, LA  dilated @ 50 mild RA dilatation, left pleural effusion  . Chronic low back pain   . CKD (chronic kidney disease), stage II   . Essential hypertension   . Falls   . Gout   . Great toe pain, right 08/30/2016  . HLD (hyperlipidemia)   . Hypothyroidism   . Lower extremity neuropathy   . PAF (paroxysmal atrial fibrillation) ([place])    a. 11/2010 Echo: EF >55%, diast dysfxn, mild conc lvh.  . Severe aortic stenosis    a. echo 11/2015 w/ mean gradient 29 mmHg, peak gradient 45 mmHg, valve area 0.41 cm^2  . Squamous cell carcinoma of skin    a. x3 s/p local excision followed by more extensive surgery due to lymphadenopathy on the right.    Past Surgical History:  Procedure Laterality Date  . CARDIAC CATHETERIZATION  04-08-2005  . KNEE SURGERY  02-09-2007   replacement  . LAMINECTOMY AND MICRODISCECTOMY LUMBAR SPINE      Bilateral  at L4-5 followed by L4-5 right microdiskectomy.    Family History  Problem Relation Age of Onset  . Hypertension Mother   . Stroke Mother     Social History Social History  Substance Use Topics  . Smoking status: Never Smoker  . Smokeless tobacco: Never Used  . Alcohol use No    No Known Allergies  Current Outpatient Prescriptions  Medication Sig Dispense Refill  . allopurinol (ZYLOPRIM) 100 MG tablet Take 1 tablet (100 mg total) by  mouth daily. 90 tablet 1  . cloNIDine (CATAPRES) 0.3 MG tablet Take 0.3 mg by mouth 2 (two) times daily.    . colchicine 0.6 MG tablet 1.2 mg once, then 0.6 mg 1 hour after. Then 0.6 mg daily. 90 tablet 1  . furosemide (LASIX) 20 MG tablet Take 1 tablet (20 mg total) by mouth daily. 90 tablet 3  . hydrochlorothiazide (HYDRODIURIL) 25 MG tablet TAKE 1 TABLET (25 MG TOTAL) BY MOUTH DAILY. 90 tablet 3  . metoprolol succinate (TOPROL-XL) 100 MG 24 hr tablet TAKE 1 TABLET DAILY TAKE   WITH OR IMMEDIATELY        FOLLOWING A MEAL 90 tablet 3  . potassium chloride (K-DUR) 10 MEQ tablet Take 1 tablet (10 mEq total) by mouth daily.  Take with Lasix 90 tablet 3  . simvastatin (ZOCOR) 20 MG tablet TAKE 1 TABLET AT BEDTIME 90 tablet 2  . SYNTHROID 100 MCG tablet TAKE 1 TABLET DAILY 90 tablet 3  . terazosin (HYTRIN) 2 MG capsule TAKE 1 CAPSULE AT BEDTIME 90 capsule 2  . XARELTO 20 MG TABS tablet TAKE 1 TABLET DAILY WITH   SUPPER 90 tablet 3   No current facility-administered medications for this visit.       Review of Systems A complete review of systems was asked and was negative except for the following positive findings Irregular heartbeat, shortness of breath, spine pain, easy bruising  Blood pressure 123/81, pulse 67, temperature 97.8 F (36.6 C), temperature source Oral, height 5\' 5"  (1.651 m), weight 165 lb (74.8 kg), SpO2 98 %.  Physical Exam CONSTITUTIONAL:  Pleasant, well-developed, well-nourished, and in no acute distress. EYES: Pupils equal and reactive to light, Sclera non-icteric EARS, NOSE, MOUTH AND THROAT:  The oropharynx was clear.  Dentition is good repair.  Oral mucosa pink and moist. LYMPH NODES:  Lymph nodes in the neck and axillae were normal RESPIRATORY:  Lungs were clear.  Normal respiratory effort without pathologic use of accessory muscles of respiration CARDIOVASCULAR: Heart was irregular with 3/6 murmurs.  There were no carotid bruits. GI: The abdomen was soft, nontender, and nondistended. There were no palpable masses. There was no hepatosplenomegaly. There were normal bowel sounds in all quadrants. GU:  Rectal deferred.   MUSCULOSKELETAL:  Normal muscle strength and tone.  No clubbing or cyanosis.   SKIN:  There were no pathologic skin lesions.  There were no nodules on palpation.  He was wearing a right upper extremity compression sleeve. There is a well-healed scar in his right axilla. There are no palpable masses in his neck or axilla. NEUROLOGIC:  Sensation is normal.  Cranial nerves are grossly intact. PSYCH:  Oriented to person, place and time.  Mood and affect are normal.  Data  Reviewed Chest x-rays  I have personally reviewed the patient's imaging, laboratory findings and medical records.    Assessment    Recurrent left-sided pleural effusion.    Plan    I had a long discussion with the patient and his daughter regarding the potential management options. One option be serial thoracentesis. Another option would be a Pleurx catheter insertion. A third option would be a chest tube insertion followed by talc pleurodesis. I did not think he was a candidate for a full thoracoscopy pleural biopsy and talc pleurodesis given his advanced age and the risks that that would incur.  I reviewed the indications and risks as well as the advantages and disadvantages of various options. He and his daughter had all  their questions answered. They would like to think about their options and I gave them each by business card and told him to contact me once they've made up her mind if they would like to proceed with any surgical procedures. They are agreeable to doing so. We will see him back as needed.       [name], MD 10/10/2016, 10:49 AM

## 2016-10-10 NOTE — Telephone Encounter (Signed)
Cardiac Clearance obtained from [place] at this time and will be scanned under Media.

## 2016-10-10 NOTE — Patient Instructions (Signed)
Please give [place] a call in  Case you decide on doing the Pleurx Catheter.

## 2016-10-13 ENCOUNTER — Encounter: Payer: Self-pay | Admitting: Family Medicine

## 2016-10-13 ENCOUNTER — Encounter: Payer: Self-pay | Admitting: Cardiovascular Disease

## 2016-10-14 ENCOUNTER — Telehealth: Payer: Self-pay | Admitting: *Deleted

## 2016-10-14 NOTE — Telephone Encounter (Signed)
-----   Message from Wayna Chalet, [place] sent at 10/10/2016 10:39 AM EDT ----- Regarding: Echocardiogram appt. Good morning. This is IT consultant. I work with Dr. Genevive Bi.  Today we saw a mutual patient. He came in today for a consult to get a Pleurx Catheter insertion. Patient's daughter wanted me to ask Dr. Rockey Situ if his Echocardiogram would be moved to be done sooner instead of August. Can you please call patient's daughter to let her know if it could be done or not.  Daughter's name: Cindy Hazy   [phone]  Thank you so much for your help.

## 2016-10-14 NOTE — Telephone Encounter (Signed)
Spoke with patient and made him aware of echocardiogram being rescheduled for November 04, 2016 at 3:00PM. Let him know that I attempted to call [name] but her voicemail was full. He states that he will make her aware. He was appreciative for the call and had no further questions at this time.

## 2016-10-15 ENCOUNTER — Telehealth: Payer: Self-pay

## 2016-10-15 NOTE — Telephone Encounter (Signed)
Called patient's daughter [name]) and couldn't leave her a voicemail since it was full. I will have to wait until she calls [place].  I called her to know if her father was going forward to have his Pleurx Catheter placed or not.

## 2016-10-16 ENCOUNTER — Other Ambulatory Visit: Payer: Self-pay | Admitting: Cardiovascular Disease

## 2016-10-16 DIAGNOSIS — I480 Paroxysmal atrial fibrillation: Secondary | ICD-10-CM

## 2016-10-16 DIAGNOSIS — I350 Nonrheumatic aortic (valve) stenosis: Secondary | ICD-10-CM

## 2016-10-16 DIAGNOSIS — I5032 Chronic diastolic (congestive) heart failure: Secondary | ICD-10-CM

## 2016-10-21 ENCOUNTER — Encounter: Payer: Self-pay | Admitting: Family Medicine

## 2016-10-21 ENCOUNTER — Ambulatory Visit (INDEPENDENT_AMBULATORY_CARE_PROVIDER_SITE_OTHER): Payer: Medicare Other | Admitting: Family Medicine

## 2016-10-21 DIAGNOSIS — J90 Pleural effusion, not elsewhere classified: Secondary | ICD-10-CM

## 2016-10-21 NOTE — Progress Notes (Signed)
Subjective:  Patient ID: Jason Wade, male    DOB: 07/25/1923  Age: 83 y.o. MRN: [id number]  CC: Follow up; Discuss treatment options regarding pleural effusion  HPI:  83 year old male with chronic diastolic CHF, hypertension, aortic valve stenosis,  A. fib, CKD presents for follow-up. He would like to discuss options regarding treatment of his left pleural effusion that is chronic.  Since her last visit, patient has seen general surgery Dr. Genevive Bi. Dr. Genevive Bi discussed serial thoracentesis, Pleurx catheter insertion, chest tube insertion and talc pleurodesis. After the discussion, patient elected to go home and decide what he would like to do. Patient has an upcoming echocardiogram to reassess the severity of his aortic stenosis. He presents today to discuss the options that he has been given and to get my point of view about what would be the best course of action. He continues to have shortness of breath. No chest pain. He is quite troubled by his shortness of breath and is ready to pursue some sort of intervention.  Social Hx   Social History   Social History  . Marital status: Married    Spouse name: N/A  . Number of children: N/A  . Years of education: N/A   Social History Main Topics  . Smoking status: Never Smoker  . Smokeless tobacco: Never Used  . Alcohol use No  . Drug use: No  . Sexual activity: Yes   Other Topics Concern  . None   Social History Narrative  . None    Review of Systems  Respiratory: Positive for shortness of breath.   Cardiovascular: Positive for leg swelling. Negative for chest pain.   Objective:  BP 120/64 (BP Location: Left Arm, Patient Position: Sitting, Cuff Size: Normal)   Pulse 82   Temp 98.5 F (36.9 C) (Oral)   Resp 20   Wt 166 lb 6 oz (75.5 kg)   SpO2 98%   BMI 27.69 kg/m   BP/Weight 10/21/2016 10/10/2016 8/34/1962  Systolic BP 229 798 921  Diastolic BP 64 81 76  Wt. (Lbs) 166.38 165 165.5  BMI 27.69 27.46 27.54    Physical  Exam  Constitutional: He is oriented to person, place, and time. He appears well-developed. No distress.  HENT:  Head: Normocephalic and atraumatic.  Pulmonary/Chest:  Mild increased work of breathing. No apparent distress.  Neurological: He is alert and oriented to person, place, and time.  Psychiatric: He has a normal mood and affect.  Vitals reviewed.  Lab Results  Component Value Date   WBC 6.4 03/06/2016   HGB 13.1 03/06/2016   HCT 38.6 (L) 03/06/2016   PLT 223 03/06/2016   GLUCOSE 97 08/08/2016   CHOL 97 08/08/2016   TRIG 41 08/08/2016   HDL 36 (L) 08/08/2016   LDLCALC 53 08/08/2016   ALT 10 (L) 08/08/2016   AST 26 08/08/2016   NA 138 08/08/2016   K 3.6 08/08/2016   CL 101 08/08/2016   CREATININE 1.35 (H) 08/08/2016   BUN 40 (H) 08/08/2016   CO2 27 08/08/2016   TSH 1.76 07/19/2015   INR 1.1 06/02/2013    Assessment & Plan:   Problem List Items Addressed This Visit      Respiratory   Pleural effusion, left    I had a lengthy discussion with the patient and his son. I recommended that he pursue one of the options that was given to him by Dr. Genevive Bi. I do not favor him having aortic valve replacement unless it  is absolutely necessary as he has had high risk and it is a high risk surgery. At the end of the visit today, he states that he will go home and contemplated but that he is likely going to proceed with chest tube and talc pleurodesis.        Follow-up: PRN  15 minutes were spent face-to-face with the patient during this encounter and all of that time was spent on counseling regarding treatment options/recommendations for his chronic/recurrent pleural effusion.  [place]

## 2016-10-21 NOTE — Assessment & Plan Note (Signed)
I had a lengthy discussion with the patient and his son. I recommended that he pursue one of the options that was given to him by Dr. Genevive Bi. I do not favor him having aortic valve replacement unless it is absolutely necessary as he has had high risk and it is a high risk surgery. At the end of the visit today, he states that he will go home and contemplated but that he is likely going to proceed with chest tube and talc pleurodesis.

## 2016-10-27 ENCOUNTER — Ambulatory Visit (INDEPENDENT_AMBULATORY_CARE_PROVIDER_SITE_OTHER): Payer: 59 | Admitting: Cardiothoracic Surgery

## 2016-10-27 ENCOUNTER — Telehealth: Payer: Self-pay | Admitting: Cardiothoracic Surgery

## 2016-10-27 ENCOUNTER — Encounter: Payer: Self-pay | Admitting: Cardiothoracic Surgery

## 2016-10-27 ENCOUNTER — Encounter
Admission: RE | Admit: 2016-10-27 | Discharge: 2016-10-27 | Disposition: A | Discharge status: Home / Self Care | Payer: Medicare Other | Source: Ambulatory Visit | Attending: Cardiothoracic Surgery | Admitting: Cardiothoracic Surgery

## 2016-10-27 VITALS — BP 146/88 | HR 73 | Temp 98.1°F | Resp 28 | Ht 65.0 in | Wt 165.8 lb

## 2016-10-27 DIAGNOSIS — Z01810 Encounter for preprocedural cardiovascular examination: Secondary | ICD-10-CM | POA: Diagnosis not present

## 2016-10-27 DIAGNOSIS — Z01812 Encounter for preprocedural laboratory examination: Secondary | ICD-10-CM | POA: Insufficient documentation

## 2016-10-27 DIAGNOSIS — J90 Pleural effusion, not elsewhere classified: Secondary | ICD-10-CM

## 2016-10-27 HISTORY — DX: Dyspnea, unspecified: R06.00

## 2016-10-27 HISTORY — DX: Polyneuropathy, unspecified: G62.9

## 2016-10-27 LAB — COMPREHENSIVE METABOLIC PANEL
ALT: 10 U/L — AB (ref 17–63)
ANION GAP: 11 (ref 5–15)
AST: 30 U/L (ref 15–41)
Albumin: 3.2 g/dL — ABNORMAL LOW (ref 3.5–5.0)
Alkaline Phosphatase: 56 U/L (ref 38–126)
BUN: 48 mg/dL — ABNORMAL HIGH (ref 6–20)
CHLORIDE: 96 mmol/L — AB (ref 101–111)
CO2: 29 mmol/L (ref 22–32)
CREATININE: 1.63 mg/dL — AB (ref 0.61–1.24)
Calcium: 8.9 mg/dL (ref 8.9–10.3)
GFR calc non Af Amer: 35 mL/min — ABNORMAL LOW (ref >60)
GFR, EST AFRICAN AMERICAN: 41 mL/min — AB (ref >60)
Glucose, Bld: 162 mg/dL — ABNORMAL HIGH (ref 65–99)
POTASSIUM: 3.1 mmol/L — AB (ref 3.5–5.1)
Sodium: 136 mmol/L (ref 135–145)
Total Bilirubin: 0.7 mg/dL (ref 0.3–1.2)
Total Protein: 6.4 g/dL — ABNORMAL LOW (ref 6.5–8.1)

## 2016-10-27 LAB — APTT: aPTT: 42 seconds — ABNORMAL HIGH (ref 24–36)

## 2016-10-27 LAB — CBC
HCT: 34.7 % — ABNORMAL LOW (ref 40.0–52.0)
Hemoglobin: 11.5 g/dL — ABNORMAL LOW (ref 13.0–18.0)
MCH: 30.5 pg (ref 26.0–34.0)
MCHC: 33.2 g/dL (ref 32.0–36.0)
MCV: 91.7 fL (ref 80.0–100.0)
PLATELETS: 233 10*3/uL (ref 150–440)
RBC: 3.79 MIL/uL — AB (ref 4.40–5.90)
RDW: 16.4 % — AB (ref 11.5–14.5)
WBC: 7.2 10*3/uL (ref 3.8–10.6)

## 2016-10-27 LAB — PROTIME-INR
INR: 2.37
PROTHROMBIN TIME: 26.3 s — AB (ref 11.4–15.2)

## 2016-10-27 NOTE — Progress Notes (Signed)
  Patient ID: Jason Wade, male   DOB: [birth year], 83 y.o.   MRN: [id number]  HISTORY: He returns today in follow-up. He's had a long time to think about his options regarding his recurrent left-sided pleural effusion. After extensive discussion he would like to proceed with Pleurx catheter insertion. I have previously reviewed with him all the possible management options and he has now decided that is what he would like to have done. He comes in today for final discussions regarding his decision.   Vitals:   10/27/16 0941  BP: (!) 146/88  Pulse: 73  Resp: (!) 28  Temp: 98.1 F (36.7 C)     EXAM:    Resp: Lungs are clear On the right but markedly diminished at the left base.  No respiratory distress, normal effort. Heart:  Irregular with grade 2/6 systolic murmurs Abd:  Abdomen is soft, non distended and non tender. No masses are palpable.  There is no rebound and no guarding.  Neurological: Alert and oriented to person, place, and time. Coordination normal.  Skin: Skin is warm and dry. No rash noted. No diaphoretic. No erythema. No pallor.  Psychiatric: Normal mood and affect. Normal behavior. Judgment and thought content normal.   He does have a sleeve on his right upper extremity for his lymphedema   ASSESSMENT: I had a long discussion with him regarding the management of his recurrent pleural effusion. Risks of Pleurx catheter insertion were reviewed. He would like [place] to proceed.   PLAN:   We will check his preoperative labs today. We have already gotten clearance to stop his overall toe prior to surgery. I will have his teaching done today.    [name], MD

## 2016-10-27 NOTE — Patient Instructions (Signed)
Your procedure is scheduled on: 11/03/16  on Report to Same Day Surgery 2nd floor medical mall [facility] Entrance-take elevator on left to 2nd floor.  Check in with surgery information desk.) To find out your arrival time please call [phone] between 1PM - 3PM on 10/31/16 FriRemember: Instructions that are not followed completely may result in serious medical risk, up to and including death, or upon the discretion of your surgeon and anesthesiologist your surgery may need to be rescheduled.    _x___ 1. Do not eat food or drink liquids after midnight. No gum chewing or                              hard candies.     __x__ 2. No Alcohol for 24 hours before or after surgery.   __x__3. No Smoking for 24 prior to surgery.   ____  4. Bring all medications with you on the day of surgery if instructed.    __x__ 5. Notify your doctor if there is any change in your medical condition     (cold, fever, infections).     Do not wear jewelry, make-up, hairpins, clips or nail polish.  Do not wear lotions, powders, or perfumes. You may wear deodorant.  Do not shave 48 hours prior to surgery. Men may shave face and neck.  Do not bring valuables to the hospital.    [facility] is not responsible for any belongings or valuables.               Contacts, dentures or bridgework may not be worn into surgery.  Leave your suitcase in the car. After surgery it may be brought to your room.  For patients admitted to the hospital, discharge time is determined by your                       treatment team.   Patients discharged the day of surgery will not be allowed to drive home.  You will need someone to drive you home and stay with you the night of your procedure.    Please read over the following fact sheets that you were given:   [facility] Preparing for Surgery and or MRSA Information   _x___ Take anti-hypertensive (unless it includes a diuretic), cardiac, seizure, asthma,     anti-reflux and  psychiatric medicines. These include:  1. cloNIDine (CATAPRES  2.metoprolol succinate   3.SYNTHROID 100 MCG   4.  5.  6.  ____Fleets enema or Magnesium Citrate as directed.   _x___ Use CHG Soap or sage wipes as directed on instruction sheet   ____ Use inhalers on the day of surgery and bring to hospital day of surgery  ____ Stop Metformin and Janumet 2 days prior to surgery.    ____ Take 1/2 of usual insulin dose the night before surgery and none on the morning     surgery.   _x___ Follow recommendations from Cardiologist, Pulmonologist or PCP regarding          stopping Aspirin, Coumadin, Pllavix ,Eliquis, Effient, or Pradaxa, and Pletal.  Stop Xarelto 3 days before surgery according to Dr Genevive Bi  X____Stop Anti-inflammatories such as Advil, Aleve, Ibuprofen, Motrin, Naproxen, Naprosyn, Goodies powders or aspirin products. OK to take Tylenol and                          Celebrex.  _x___ Stop supplements until after surgery.  But may continue Vitamin D, Vitamin B,       and multivitamin.   ____ Bring C-Pap to the hospital.

## 2016-10-27 NOTE — Patient Instructions (Addendum)
You have been provided your Pleurx Folder. Read through this information and call with any questions.  You will be seen by a nurse back in the office for your first drainage, 2-3 days after discharge. Call the office when you are discharged to schedule this appointment.  In regards to supplies, the order has been sent. You will receive a call from the supply company and be asked to call them back prior to supplies being delivered. Make sure that you call the supplier as soon as you arrive home after discharge from hospital.  We will set-up home health for you to help you with education and dressing changes for a period of time. We will be in touch when this has all been completed.  Your surgery is scheduled for 11/03/16 with Dr. Genevive Bi to have the pleurx catheter placed. You will most likely need to spend at least 1 night in the hospital but we will get you discharged as soon as it is safe to do so. Please see your Blue (Pre-care sheet) for more information regarding your surgery.

## 2016-10-27 NOTE — Telephone Encounter (Signed)
Pt advised of pre op date/time and sx date. Sx: 11/03/16 with Dr Geanie Kenning cath placement.  Pre op: 10/27/16 @ 12:30pm--office.   Patient made aware to call [phone], between 1-3:00pm the day before surgery, to find out what time to arrive.

## 2016-10-28 ENCOUNTER — Telehealth: Payer: Self-pay | Admitting: General Practice

## 2016-10-28 NOTE — Telephone Encounter (Signed)
Called [name] Wall back and had to leave her a detailed message letting her know that her father's chest X-Ray order was faxed to [facility].

## 2016-10-28 NOTE — Pre-Procedure Instructions (Signed)
EKG/REQUEST FOR CARDIAC NOTE CALLED AND FAXED TO [place] AT DR [name] AS REQUESTED BY DR P Kayleen Memos

## 2016-10-28 NOTE — Telephone Encounter (Signed)
Patients daughter is calling, Dr. Genevive Bi said the patient needed a chest xray done due to the patient is having surgery done on Monday. Can you put the order in for the patient to have his chest xray done on Friday. Please call patient and advice.

## 2016-10-28 NOTE — Pre-Procedure Instructions (Signed)
LABS WITH KT 3.1 FAXED TO DR Genevive Bi

## 2016-10-30 ENCOUNTER — Telehealth: Payer: Self-pay

## 2016-10-30 ENCOUNTER — Telehealth: Payer: Self-pay | Admitting: Cardiovascular Disease

## 2016-10-30 DIAGNOSIS — J90 Pleural effusion, not elsewhere classified: Secondary | ICD-10-CM

## 2016-10-30 MED ORDER — POTASSIUM CHLORIDE ER 10 MEQ PO TBCR: 10.0000 meq | EXTENDED_RELEASE_TABLET | Freq: Every day | ORAL | 0 refills | Status: DC

## 2016-10-30 NOTE — Telephone Encounter (Signed)
Received cardiac clearance request for pt to proceed w/ chest tube insertion on 11/03/16 w/ Dr. Genevive Bi.  Please route clearance and most recent office note and EKG to [phone].

## 2016-10-30 NOTE — Telephone Encounter (Signed)
Patient's Pleurx supply order form has been faxed at this time to [place] [phone] with positive confirmation.  Patient's Home Health Referral has been placed in EPIC to [facility].

## 2016-10-30 NOTE — Telephone Encounter (Signed)
Acceptable risk Would hold xarelto 2 days prior to procedure Restarting xarelto at the guidance of Dr. Faith Rogue

## 2016-10-30 NOTE — Telephone Encounter (Signed)
Upon review of Pre-admission testing labs, Potassium is 3.3 and is currently taking K-Dur 71meq Daily. Patient will need to increase his potassium to 69meq daily until date of surgery. Spoke with patient at this time and he was given this information. He was also told that he should be hearing from [place] in regards to his Pleurx supplies and [place] in regards to his [place]. He verbalizes understanding. Encouraged patient to call back with any further questions.

## 2016-10-31 ENCOUNTER — Other Ambulatory Visit: Payer: Self-pay

## 2016-10-31 ENCOUNTER — Ambulatory Visit
Admission: RE | Admit: 2016-10-31 | Discharge: 2016-10-31 | Disposition: A | Discharge status: Home / Self Care | Payer: Medicare Other | Source: Ambulatory Visit | Attending: Cardiothoracic Surgery | Admitting: Cardiothoracic Surgery

## 2016-10-31 DIAGNOSIS — J90 Pleural effusion, not elsewhere classified: Secondary | ICD-10-CM | POA: Diagnosis not present

## 2016-10-31 DIAGNOSIS — Z01811 Encounter for preprocedural respiratory examination: Secondary | ICD-10-CM | POA: Insufficient documentation

## 2016-10-31 DIAGNOSIS — J9811 Atelectasis: Secondary | ICD-10-CM | POA: Diagnosis not present

## 2016-10-31 NOTE — Telephone Encounter (Signed)
Routed to fax # provided. 

## 2016-10-31 NOTE — Pre-Procedure Instructions (Signed)
Jason Merritts, MD  to [name], RN     10/30/16 9:04 PM  Note    Acceptable risk Would hold xarelto 2 days prior to procedure Restarting xarelto at the guidance of Dr. Faith Rogue

## 2016-11-03 ENCOUNTER — Encounter: Payer: Self-pay | Admitting: *Deleted

## 2016-11-03 ENCOUNTER — Telehealth: Payer: Self-pay

## 2016-11-03 ENCOUNTER — Observation Stay
Admission: AD | Admit: 2016-11-03 | Discharge: 2016-11-04 | Disposition: A | Discharge status: Home / Self Care | Payer: Medicare Other | Source: Ambulatory Visit | Attending: Cardiothoracic Surgery | Admitting: Cardiothoracic Surgery

## 2016-11-03 ENCOUNTER — Encounter
Admission: AD | Disposition: A | Discharge status: Home / Self Care | Payer: Self-pay | Source: Ambulatory Visit | Attending: Cardiothoracic Surgery

## 2016-11-03 ENCOUNTER — Inpatient Hospital Stay: Payer: Medicare Other

## 2016-11-03 ENCOUNTER — Ambulatory Visit: Payer: Medicare Other | Admitting: Registered Nurse

## 2016-11-03 ENCOUNTER — Telehealth: Payer: Self-pay | Admitting: Cardiovascular Disease

## 2016-11-03 DIAGNOSIS — J90 Pleural effusion, not elsewhere classified: Principal | ICD-10-CM

## 2016-11-03 DIAGNOSIS — I350 Nonrheumatic aortic (valve) stenosis: Secondary | ICD-10-CM | POA: Diagnosis not present

## 2016-11-03 DIAGNOSIS — Z09 Encounter for follow-up examination after completed treatment for conditions other than malignant neoplasm: Secondary | ICD-10-CM

## 2016-11-03 HISTORY — PX: CHEST TUBE INSERTION: SHX231

## 2016-11-03 LAB — POTASSIUM: POTASSIUM: 4.4 mmol/L (ref 3.5–5.1)

## 2016-11-03 LAB — PROTIME-INR
INR: 1.46
Prothrombin Time: 17.9 seconds — ABNORMAL HIGH (ref 11.4–15.2)

## 2016-11-03 LAB — APTT: aPTT: 34 seconds (ref 24–36)

## 2016-11-03 SURGERY — CHEST TUBE INSERTION
Anesthesia: Monitor Anesthesia Care | Laterality: Left | Wound class: Clean Contaminated

## 2016-11-03 MED ORDER — FAMOTIDINE 20 MG PO TABS
ORAL_TABLET | ORAL | Status: AC
  Administered 2016-11-03: 20 mg via ORAL
  Filled 2016-11-03: qty 1

## 2016-11-03 MED ORDER — OXYCODONE HCL 5 MG PO TABS
5.0000 mg | ORAL_TABLET | ORAL | Status: DC | PRN
  Administered 2016-11-03: 10 mg via ORAL
  Administered 2016-11-03 (×2): 5 mg via ORAL
  Filled 2016-11-03: qty 1
  Filled 2016-11-03: qty 2
  Filled 2016-11-03: qty 1

## 2016-11-03 MED ORDER — TERAZOSIN HCL 2 MG PO CAPS
2.0000 mg | ORAL_CAPSULE | Freq: Every day | ORAL | Status: DC
  Administered 2016-11-03: 2 mg via ORAL
  Filled 2016-11-03 (×2): qty 1

## 2016-11-03 MED ORDER — FENTANYL CITRATE (PF) 100 MCG/2ML IJ SOLN
INTRAMUSCULAR | Status: AC
  Filled 2016-11-03: qty 2

## 2016-11-03 MED ORDER — HYDROCHLOROTHIAZIDE 25 MG PO TABS
25.0000 mg | ORAL_TABLET | Freq: Every day | ORAL | Status: DC
  Administered 2016-11-04: 25 mg via ORAL
  Filled 2016-11-03: qty 1

## 2016-11-03 MED ORDER — MIDAZOLAM HCL 2 MG/2ML IJ SOLN
INTRAMUSCULAR | Status: AC
  Filled 2016-11-03: qty 2

## 2016-11-03 MED ORDER — LIDOCAINE HCL (PF) 1 % IJ SOLN
INTRAMUSCULAR | Status: AC
  Filled 2016-11-03: qty 30

## 2016-11-03 MED ORDER — FUROSEMIDE 20 MG PO TABS
20.0000 mg | ORAL_TABLET | Freq: Every day | ORAL | Status: DC
  Filled 2016-11-03 (×2): qty 1

## 2016-11-03 MED ORDER — DEXTROSE 5 % IV SOLN
1.5000 g | Freq: Two times a day (BID) | INTRAVENOUS | Status: DC
  Filled 2016-11-03 (×2): qty 1.5

## 2016-11-03 MED ORDER — LIDOCAINE HCL 1 % IJ SOLN
INTRAMUSCULAR | Status: DC | PRN
  Administered 2016-11-03: 13 mL

## 2016-11-03 MED ORDER — ALLOPURINOL 100 MG PO TABS
100.0000 mg | ORAL_TABLET | Freq: Every day | ORAL | Status: DC
  Administered 2016-11-03 – 2016-11-04 (×2): 100 mg via ORAL
  Filled 2016-11-03 (×2): qty 1

## 2016-11-03 MED ORDER — FAMOTIDINE 20 MG PO TABS
20.0000 mg | ORAL_TABLET | Freq: Once | ORAL | Status: AC
  Administered 2016-11-03: 20 mg via ORAL

## 2016-11-03 MED ORDER — METOPROLOL SUCCINATE ER 100 MG PO TB24
100.0000 mg | ORAL_TABLET | Freq: Every day | ORAL | Status: DC
  Filled 2016-11-03 (×2): qty 1

## 2016-11-03 MED ORDER — ACETAMINOPHEN 500 MG PO TABS
500.0000 mg | ORAL_TABLET | Freq: Four times a day (QID) | ORAL | Status: DC | PRN
  Administered 2016-11-03: 1000 mg via ORAL
  Filled 2016-11-03 (×2): qty 1

## 2016-11-03 MED ORDER — ALBUTEROL SULFATE (2.5 MG/3ML) 0.083% IN NEBU: 2.5000 mg | INHALATION_SOLUTION | RESPIRATORY_TRACT | Status: DC

## 2016-11-03 MED ORDER — BISACODYL 5 MG PO TBEC
10.0000 mg | DELAYED_RELEASE_TABLET | Freq: Every day | ORAL | Status: DC
  Filled 2016-11-03 (×2): qty 2

## 2016-11-03 MED ORDER — LACTATED RINGERS IV SOLN
INTRAVENOUS | Status: DC
  Administered 2016-11-03: 10:00:00 via INTRAVENOUS

## 2016-11-03 MED ORDER — FENTANYL CITRATE (PF) 100 MCG/2ML IJ SOLN
25.0000 ug | INTRAMUSCULAR | Status: DC | PRN
  Administered 2016-11-03: 25 ug via INTRAVENOUS

## 2016-11-03 MED ORDER — DEXMEDETOMIDINE HCL IN NACL 200 MCG/50ML IV SOLN
INTRAVENOUS | Status: DC | PRN
  Administered 2016-11-03: 16 ug via INTRAVENOUS

## 2016-11-03 MED ORDER — DEXTROSE-NACL 5-0.45 % IV SOLN
INTRAVENOUS | Status: DC
  Administered 2016-11-03: 15:00:00 via INTRAVENOUS

## 2016-11-03 MED ORDER — CEFAZOLIN SODIUM 1 G IJ SOLR
INTRAMUSCULAR | Status: AC
  Filled 2016-11-03: qty 10

## 2016-11-03 MED ORDER — MORPHINE SULFATE (PF) 2 MG/ML IV SOLN
2.0000 mg | INTRAVENOUS | Status: DC | PRN
  Administered 2016-11-03 (×2): 2 mg via INTRAVENOUS
  Filled 2016-11-03 (×2): qty 1

## 2016-11-03 MED ORDER — KETAMINE HCL 50 MG/ML IJ SOLN
INTRAMUSCULAR | Status: DC | PRN
  Administered 2016-11-03: 25 mg via INTRAMUSCULAR

## 2016-11-03 MED ORDER — MIDAZOLAM HCL 2 MG/2ML IJ SOLN
INTRAMUSCULAR | Status: DC | PRN
  Administered 2016-11-03: 0.5 mg via INTRAVENOUS

## 2016-11-03 MED ORDER — DEXMEDETOMIDINE HCL IN NACL 200 MCG/50ML IV SOLN
INTRAVENOUS | Status: DC | PRN
  Administered 2016-11-03: 0.7 ug/kg/h via INTRAVENOUS

## 2016-11-03 MED ORDER — KETAMINE HCL 50 MG/ML IJ SOLN
INTRAMUSCULAR | Status: AC
  Filled 2016-11-03: qty 10

## 2016-11-03 MED ORDER — CEFAZOLIN SODIUM-DEXTROSE 1-4 GM/50ML-% IV SOLN
INTRAVENOUS | Status: DC | PRN
  Administered 2016-11-03: 1 g via INTRAVENOUS

## 2016-11-03 MED ORDER — ONDANSETRON HCL 4 MG/2ML IJ SOLN: 4.0000 mg | Freq: Four times a day (QID) | INTRAMUSCULAR | Status: DC | PRN

## 2016-11-03 MED ORDER — POTASSIUM CHLORIDE ER 10 MEQ PO TBCR
10.0000 meq | EXTENDED_RELEASE_TABLET | Freq: Every day | ORAL | Status: DC
  Administered 2016-11-04: 10 meq via ORAL
  Filled 2016-11-03 (×2): qty 1

## 2016-11-03 MED ORDER — PROPOFOL 10 MG/ML IV BOLUS
INTRAVENOUS | Status: AC
  Filled 2016-11-03: qty 20

## 2016-11-03 MED ORDER — ONDANSETRON HCL 4 MG/2ML IJ SOLN: 4.0000 mg | Freq: Once | INTRAMUSCULAR | Status: DC | PRN

## 2016-11-03 MED ORDER — DEXMEDETOMIDINE HCL IN NACL 200 MCG/50ML IV SOLN
INTRAVENOUS | Status: AC
  Filled 2016-11-03: qty 50

## 2016-11-03 MED ORDER — CLONIDINE HCL 0.1 MG PO TABS
0.3000 mg | ORAL_TABLET | Freq: Two times a day (BID) | ORAL | Status: DC
  Administered 2016-11-03: 0.3 mg via ORAL
  Filled 2016-11-03 (×2): qty 3

## 2016-11-03 MED ORDER — LEVOTHYROXINE SODIUM 100 MCG PO TABS
100.0000 ug | ORAL_TABLET | Freq: Every day | ORAL | Status: DC
Start: 2016-11-04 — End: 2016-11-04
  Administered 2016-11-04: 100 ug via ORAL
  Filled 2016-11-03: qty 1

## 2016-11-03 MED ORDER — SIMVASTATIN 20 MG PO TABS
20.0000 mg | ORAL_TABLET | Freq: Every day | ORAL | Status: DC
  Filled 2016-11-03: qty 1

## 2016-11-03 MED ORDER — COLCHICINE 0.6 MG PO TABS
0.6000 mg | ORAL_TABLET | Freq: Every day | ORAL | Status: DC
  Administered 2016-11-03 – 2016-11-04 (×2): 0.6 mg via ORAL
  Filled 2016-11-03 (×2): qty 1

## 2016-11-03 SURGICAL SUPPLY — 36 items
BLADE SURG SZ11 CARB STEEL (BLADE) ×2 IMPLANT
CANISTER SUCT 1200ML W/VALVE (MISCELLANEOUS) ×2 IMPLANT
CHLORAPREP W/TINT 26ML (MISCELLANEOUS) ×2 IMPLANT
DRAIN CHEST DRY SUCT SGL (MISCELLANEOUS) ×2 IMPLANT
DRAPE INCISE IOBAN 66X45 STRL (DRAPES) ×2 IMPLANT
DRAPE LAPAROTOMY 77X122 PED (DRAPES) ×2 IMPLANT
ELECT REM PT RETURN 9FT ADLT (ELECTROSURGICAL) ×2
ELECTRODE REM PT RTRN 9FT ADLT (ELECTROSURGICAL) ×1 IMPLANT
GLOVE SURG SYN 7.5  E (GLOVE) ×3
GLOVE SURG SYN 7.5 E (GLOVE) ×3 IMPLANT
GLOVE SURG SYN 7.5 PF PI (GLOVE) ×1 IMPLANT
GOWN STRL REUS W/ TWL LRG LVL3 (GOWN DISPOSABLE) ×2 IMPLANT
GOWN STRL REUS W/TWL LRG LVL3 (GOWN DISPOSABLE) ×6
KIT PLEURX DRAIN CATH 15.5FR (DRAIN) ×2 IMPLANT
KIT RM TURNOVER STRD PROC AR (KITS) ×2 IMPLANT
LABEL OR SOLS (LABEL) ×2 IMPLANT
MARKER SKIN DUAL TIP RULER LAB (MISCELLANEOUS) ×2 IMPLANT
NDL FILTER BLUNT 18X1 1/2 (NEEDLE) ×1 IMPLANT
NEEDLE FILTER BLUNT 18X 1/2SAF (NEEDLE) ×1
NEEDLE FILTER BLUNT 18X1 1/2 (NEEDLE) ×1 IMPLANT
PACK BASIN MINOR ARMC (MISCELLANEOUS) ×2 IMPLANT
STRIP CLOSURE SKIN 1/2X4 (GAUZE/BANDAGES/DRESSINGS) ×1 IMPLANT
SUCTION FRAZIER HANDLE 10FR (MISCELLANEOUS)
SUCTION TUBE FRAZIER 10FR DISP (MISCELLANEOUS) ×1 IMPLANT
SUT ETHILON 3-0 FS-10 30 BLK (SUTURE) ×2
SUT ETHILON 4-0 (SUTURE)
SUT ETHILON 4-0 FS2 18XMFL BLK (SUTURE)
SUT SILK 1 SH (SUTURE) ×2 IMPLANT
SUT VIC AB 0 SH 27 (SUTURE) ×2 IMPLANT
SUT VIC AB 2-0 SH 27 (SUTURE) ×4
SUT VIC AB 2-0 SH 27XBRD (SUTURE) ×1 IMPLANT
SUT VIC AB 3-0 SH 27 (SUTURE)
SUT VIC AB 3-0 SH 27X BRD (SUTURE) IMPLANT
SUTURE EHLN 3-0 FS-10 30 BLK (SUTURE) ×1 IMPLANT
SUTURE ETHLN 4-0 FS2 18XMF BLK (SUTURE) IMPLANT
SYR 30ML LL (SYRINGE) ×2 IMPLANT

## 2016-11-03 NOTE — Anesthesia Postprocedure Evaluation (Signed)
Anesthesia Post Note  Patient: Jason Wade  Procedure(s) Performed: Procedure(s) (LRB): CHEST TUBE INSERTION (Left)  Patient location during evaluation: PACU Anesthesia Type: MAC Level of consciousness: awake and alert Pain management: pain level controlled Vital Signs Assessment: post-procedure vital signs reviewed and stable Respiratory status: spontaneous breathing, nonlabored ventilation, respiratory function stable and patient connected to nasal cannula oxygen Cardiovascular status: blood pressure returned to baseline and stable Postop Assessment: no signs of nausea or vomiting Anesthetic complications: no     Last Vitals:  Vitals:   11/03/16 0930 11/03/16 1214  BP: 116/61 126/70  Pulse: (!) 40   Resp: 18 (!) 22  Temp: 36.5 C 36.4 C    Last Pain:  Vitals:   11/03/16 1214  TempSrc:   PainSc: 2                  Molli Barrows

## 2016-11-03 NOTE — Progress Notes (Signed)
15 minute call to floor. 

## 2016-11-03 NOTE — Progress Notes (Signed)
Dr. Genevive Bi at bedside, evaluating chest tube and reviewed Chest xray.

## 2016-11-03 NOTE — Transfer of Care (Signed)
Immediate Anesthesia Transfer of Care Note  Patient: Jason Wade  Procedure(s) Performed: Procedure(s): CHEST TUBE INSERTION (Left)  Patient Location: PACU  Anesthesia Type:General  Level of Consciousness: sedated  Airway & Oxygen Therapy: Patient Spontanous Breathing and Patient connected to face mask oxygen  Post-op Assessment: Report given to RN and Post -op Vital signs reviewed and stable  Post vital signs: Reviewed and stable  Last Vitals:  Vitals:   11/03/16 0930 11/03/16 1214  BP: 116/61 126/70  Pulse: (!) 40   Resp: 18 (!) 22  Temp: 36.5 C 70.2 C    Complications: No apparent anesthesia complications

## 2016-11-03 NOTE — Progress Notes (Signed)
Patient is not getting pain relief with the oxycodone.  Dr Genevive Bi gave an order for morphine

## 2016-11-03 NOTE — Telephone Encounter (Signed)
Patient had to cancel appt for echo as he was admitted to procedure for PE patient will be staying at [facility] for atleast one night following todays procedure  Please call daughter to discuss need to r/s echo outpatient if still needed or if patient can have done while admitted.

## 2016-11-03 NOTE — Telephone Encounter (Signed)
Patient's daughter, [name], came into office. She had questions regarding patient's post-procedural care of chest tube and whether patient still needed the echo.  Patient will stay the night in the hospital tonight and be discharged tomorrow. Dr Faith Rogue said he would not be managing patient's tube and drainage. Daughter was told that Dr [name] office would probably change out the first few canisters of drainage. Daughter also has information to call home health for management of chest tube. Advised I would make Dr Rockey Situ aware for further advice on still needing echo and management of chest tube.

## 2016-11-03 NOTE — Interval H&P Note (Signed)
History and Physical Interval Note:  11/03/2016 9:55 AM  Jason Wade  has presented today for surgery, with the diagnosis of LEFT PLEURAL EFFUSION  The various methods of treatment have been discussed with the patient and family. After consideration of risks, benefits and other options for treatment, the patient has consented to  Procedure(s): CHEST TUBE INSERTION (Left) as a surgical intervention .  The patient's history has been reviewed, patient examined, no change in status, stable for surgery.  I have reviewed the patient's chart and labs.  Questions were answered to the patient's satisfaction.     [name]

## 2016-11-03 NOTE — Telephone Encounter (Signed)
No answer. Left message to call back.   

## 2016-11-03 NOTE — H&P (View-Only) (Signed)
  Patient ID: Jason Wade, male   DOB: 07-27-1923, 83 y.o.   MRN: [id number]  HISTORY: He returns today in follow-up. He's had a long time to think about his options regarding his recurrent left-sided pleural effusion. After extensive discussion he would like to proceed with Pleurx catheter insertion. I have previously reviewed with him all the possible management options and he has now decided that is what he would like to have done. He comes in today for final discussions regarding his decision.   Vitals:   10/27/16 0941  BP: (!) 146/88  Pulse: 73  Resp: (!) 28  Temp: 98.1 F (36.7 C)     EXAM:    Resp: Lungs are clear On the right but markedly diminished at the left base.  No respiratory distress, normal effort. Heart:  Irregular with grade 2/6 systolic murmurs Abd:  Abdomen is soft, non distended and non tender. No masses are palpable.  There is no rebound and no guarding.  Neurological: Alert and oriented to person, place, and time. Coordination normal.  Skin: Skin is warm and dry. No rash noted. No diaphoretic. No erythema. No pallor.  Psychiatric: Normal mood and affect. Normal behavior. Judgment and thought content normal.   He does have a sleeve on his right upper extremity for his lymphedema   ASSESSMENT: I had a long discussion with him regarding the management of his recurrent pleural effusion. Risks of Pleurx catheter insertion were reviewed. He would like [place] to proceed.   PLAN:   We will check his preoperative labs today. We have already gotten clearance to stop his overall toe prior to surgery. I will have his teaching done today.    [name], MD

## 2016-11-03 NOTE — Anesthesia Post-op Follow-up Note (Cosign Needed)
Anesthesia QCDR form completed.        

## 2016-11-03 NOTE — Progress Notes (Signed)
Patient requesting something not as strong for pain. Dr Genevive Bi notified and gave and order for tylenol and changed diet to regular

## 2016-11-03 NOTE — Anesthesia Preprocedure Evaluation (Signed)
Anesthesia Evaluation  Patient identified by MRN, date of birth, ID band Patient awake    Reviewed: Allergy & Precautions, H&P , NPO status , Patient's Chart, lab work & pertinent test results, reviewed documented beta blocker date and time   Airway Mallampati: II  TM Distance: >3 FB Neck ROM: full    Dental no notable dental hx. (+) Teeth Intact   Pulmonary neg pulmonary ROS, shortness of breath and with exertion,    Pulmonary exam normal breath sounds clear to auscultation       Cardiovascular Exercise Tolerance: Poor hypertension, On Medications + Peripheral Vascular Disease and +CHF  negative cardio ROS  Atrial Fibrillation + Valvular Problems/Murmurs AS  Rhythm:regular Rate:Normal     Neuro/Psych  Neuromuscular disease negative neurological ROS  negative psych ROS   GI/Hepatic negative GI ROS, Neg liver ROS,   Endo/Other  negative endocrine ROSdiabetesHypothyroidism   Renal/GU CRFRenal disease     Musculoskeletal   Abdominal   Peds  Hematology negative hematology ROS (+)   Anesthesia Other Findings   Reproductive/Obstetrics negative OB ROS                             Anesthesia Physical Anesthesia Plan  ASA: IV  Anesthesia Plan: MAC   Post-op Pain Management:    Induction:   PONV Risk Score and Plan: 1 and Ondansetron and Dexamethasone  Airway Management Planned:   Additional Equipment:   Intra-op Plan:   Post-operative Plan:   Informed Consent: I have reviewed the patients History and Physical, chart, labs and discussed the procedure including the risks, benefits and alternatives for the proposed anesthesia with the patient or authorized representative who has indicated his/her understanding and acceptance.     Plan Discussed with: CRNA  Anesthesia Plan Comments:         Anesthesia Quick Evaluation

## 2016-11-03 NOTE — Telephone Encounter (Signed)
Spoke with Patient's daughter, Jason Wade at this time and she is confused over process with Pleurx supplies. Patient's daughter also states that they have been contacted by [facility] and will come to see patient on day of discharge at home.  I called Ple[facility] and spoke with Jerrye Beavers at this time. He states that order for supplies has been processed and they are awaiting a call to let them know that patient is d/c'ed and at home. Patient's daughter is to call [phone] as soon as patient is discharged from the hospital so that they may drop off supplies.  I returned phone call to patient's daughter, Jason Wade and explained situation above. She wrote down number to call Ple[facility] and verbalizes understanding of all information above.

## 2016-11-03 NOTE — Op Note (Signed)
  11/03/2016  12:32 PM  PATIENT:  Jason Wade  83 y.o. male  PRE-OPERATIVE DIAGNOSIS:  Recurrent left sided pleural effusion  POST-OPERATIVE DIAGNOSIS:  Same  PROCEDURE:  Insertion of left sided Pleurx catheter  SURGEON:  Surgeon(s) and Role:    [name], MD - Primary  ASSISTANTS: Ferol Luz PAS  ANESTHESIA: Monitored anesthesia care  INDICATIONS FOR PROCEDURE this is a 83 year old white male with a history of recurrent pleural effusions. He is status post several thoracentesis and is required insertion of a Pleurx catheter for management of his pleural effusion. There is felt that this was secondary to his aortic stenosis. The indications and risks of the procedure were explained to patient gaze informed consent.  DICTATION: Patient was brought to the operating suite and placed in the supine position. Monitored anesthesia care was commenced. He was then turned for a left sided insertion of a Pleurx catheter. A timeout was called. 1 g capsule was administered. The skin sites were then selected and the patient was prepped and draped in usual sterile fashion. The skin was anesthetized with 1% lidocaine as a local anesthetic. A second incision was made for a slip of the Pleurx catheter itself. The catheter was then tunneled from the entrance site over to the insertion site. Using a small hemostat and incision was extended deeper into the pleural space until the pleural space was entered. There is a fair amount of fluid within the pleural space and the catheter was inserted down through the muscles of the chest wall into the pleural space itself. Immediately there is about a liter of thin clear fluid present. The tube was secured to the skin site with #1 silk. The muscles were approximated with interrupted 2-0 Vicryl the subcutaneous tissues with a running Vicryl and the skin with nylon. Patient already procedure well and was rolled into the supine position after sterile dressings were  then applied. Total output from the Pleurx catheter was 1200 when we left the room.   [name], MD

## 2016-11-04 ENCOUNTER — Other Ambulatory Visit: Payer: Medicare Other

## 2016-11-04 ENCOUNTER — Inpatient Hospital Stay: Payer: Medicare Other

## 2016-11-04 DIAGNOSIS — J90 Pleural effusion, not elsewhere classified: Secondary | ICD-10-CM | POA: Diagnosis not present

## 2016-11-04 NOTE — Discharge Summary (Signed)
Physician Discharge Summary  Patient ID: Jason Wade MRN: [id number] DOB/AGE: 07-25-1923 83 y.o.  Admit date: 11/03/2016 Discharge date: 11/04/2016   Discharge Diagnoses:  Active Problems:   Recurrent pleural effusion on left   Procedures:Insertion of a left-sided Pleurx catheter  Hospital Course: This patient was admitted following insertion of a left-sided Pleurx catheter. He had a small pleural effusion and pneumothorax noted on the post-insertion chest x-ray. Postoperatively he did not have an air leak and the chest x-ray showed no pneumothorax. It was felt that the patient was ready for discharge. He was given his discharge instructions and he will follow-up in the office this week. The family was instructed on how to manage the Pleurx catheter.  Disposition: 01-Home or Self Care  Discharge Instructions    Call MD for:  redness, tenderness, or signs of infection (pain, swelling, redness, odor or green/yellow discharge around incision site)    Complete by:  As directed    Change dressing (specify)    Complete by:  As directed    Please change the dressing on the Pleurx catheter every other day beginning Thursday   Diet - low sodium heart healthy    Complete by:  As directed    Discharge instructions    Complete by:  As directed    Please contact my office to arrange for follow-up. Remove the dressing on Thursday and cleanse around the site as per instructions with thick Pleurx catheter kit. Perform this every other day. Do not shower. Do not get the dressings wet. Please contact Dr. Lacinda Axon and Dr. Candis Musa for continued management.   Increase activity slowly    Complete by:  As directed      Allergies as of 11/04/2016   No Known Allergies     Medication List    TAKE these medications   allopurinol 100 MG tablet Commonly known as:  ZYLOPRIM Take 1 tablet (100 mg total) by mouth daily.   cloNIDine 0.3 MG tablet Commonly known as:  CATAPRES Take 0.3 mg by mouth 2 (two)  times daily.   colchicine 0.6 MG tablet 1.2 mg once, then 0.6 mg 1 hour after. Then 0.6 mg daily.   furosemide 20 MG tablet Commonly known as:  LASIX Take 1 tablet (20 mg total) by mouth daily.   hydrochlorothiazide 25 MG tablet Commonly known as:  HYDRODIURIL TAKE 1 TABLET (25 MG TOTAL) BY MOUTH DAILY.   metoprolol succinate 100 MG 24 hr tablet Commonly known as:  TOPROL-XL TAKE 1 TABLET DAILY TAKE   WITH OR IMMEDIATELY        FOLLOWING A MEAL   potassium chloride 10 MEQ tablet Commonly known as:  K-DUR Take 1 tablet (10 mEq total) by mouth daily. Take with Lasix What changed:  Another medication with the same name was removed. Continue taking this medication, and follow the directions you see here.   simvastatin 20 MG tablet Commonly known as:  ZOCOR TAKE 1 TABLET AT BEDTIME   SYNTHROID 100 MCG tablet Generic drug:  levothyroxine TAKE 1 TABLET DAILY   terazosin 2 MG capsule Commonly known as:  HYTRIN TAKE 1 CAPSULE AT BEDTIME   XARELTO 20 MG Tabs tablet Generic drug:  rivaroxaban TAKE 1 TABLET DAILY WITH   SUPPER        [name], MD

## 2016-11-04 NOTE — Discharge Instructions (Signed)
Chest Tube Insertion, Adult °A chest tube is a thin, flexible tube that is inserted into the space between your lung and your chest wall. You may need a chest tube if you have a collapsed lung from illness or a severe injury. A collapsed lung can be caused by: °· An air leak (pneumothorax). °· Blood collection (hemothorax). °· Fluid buildup from an infection (empyema). ° °The chest tube drains the fluid or air from your lung. It may be attached to a suction device to help with drainage. You will need to stay in the hospital while the chest tube is in place. °Tell a health care provider about: °· Any allergies you have. °· All medicines you are taking, including vitamins, herbs, eye drops, creams, and over-the-counter medicines. °· Any problems you or family members have had with anesthetic medicines. °· Any blood disorders you have. °· Any surgeries you have had. °· Any medical conditions you have, including any recent fever or cold symptoms. °· Whether you are pregnant or may be pregnant. °What are the risks? °Generally, this is a safe procedure. However, problems may occur, including: °· Bleeding. °· Infection. °· Allergic reaction to medicines. °· Lung damage. °· Damage to the blood vessels or nerves near the lung. °· Failure of the chest tube to work properly. ° °What happens before the procedure? °Staying hydrated °Follow instructions from your health care provider about hydration, which may include: °· Up to 2 hours before the procedure - you may continue to drink clear liquids, such as water, clear fruit juice, black coffee, and plain tea. ° °Eating and drinking restrictions °Follow instructions from your health care provider about eating and drinking, which may include: °· 8 hours before the procedure - stop eating heavy meals or foods such as meat, fried foods, or fatty foods. °· 6 hours before the procedure - stop eating light meals or foods, such as toast or cereal. °· 6 hours before the procedure - stop  drinking milk or drinks that contain milk. °· 2 hours before the procedure - stop drinking clear liquids. ° °Medicines °· Ask your health care provider about: °? Changing or stopping your regular medicines. This is especially important if you are taking diabetes medicines or blood thinners. °? Taking medicines such as aspirin and ibuprofen. These medicines can thin your blood. Do not take these medicines before your procedure if your health care provider instructs you not to. °General instructions °· You will have a chest X-ray or other imaging studies of the lung. °· Plan to have someone take you home from the hospital or clinic. °· If you will be going home right after the procedure, plan to have someone with you for 24 hours. °What happens during the procedure? °· To reduce your risk of infection: °? Your health care team will wash or sanitize their hands. °? Your skin will be washed with soap. °? Hair may be removed from the surgical area. °· An IV tube will be inserted into one of your veins. °· You will be given one or more of the following: °? A medicine to help you relax (sedative). °? A medicine to numb the area (local anesthetic). °· You may be given antibiotic and pain medicines through the IV tube. °· The surgeon will make a small incision in a space between your ribs. °· The chest tube will be placed through the incision into the space between the lung and your chest wall. °· Stitches (sutures) will be used to close   the incision around the tube. °· The chest tube may be attached to a suction device. °· The incision site will be covered with an airtight bandage (dressing). °· Another chest X-ray will be done to check the position of the tube. °The procedure may vary among health care providers and hospitals. °What happens after the procedure? °· Your blood pressure, heart rate, breathing rate, and blood oxygen level will be monitored until the medicines you were given have worn off. °· You may continue  to get pain medicine or antibiotics through the IV tube. °· The tube and dressing will be checked regularly. °· You will be encouraged to cough and take deep breaths. °· You may be given oxygen to breathe. °· Chest X-rays will be done to find out if the lung is inflating. After the lung is inflated: °? Another chest X-ray may be done. °? The chest tube can be removed after the lung remains inflated and you are breathing easily. °? A new dressing will be put on. °· Do not drive for 24 hours if you were given a sedative. °This information is not intended to replace advice given to you by your health care provider. Make sure you discuss any questions you have with your health care provider. °Document Released: 07/16/2006 Document Revised: 10/26/2015 Document Reviewed: 09/19/2015 °Elsevier Interactive Patient Education © 2018 [employer] ° °

## 2016-11-04 NOTE — Care Management (Signed)
Late entry.   Patient admitted with rt pleural effusion.  Status post Pleurx cath insertion.  Patient lives at home alone.  Adult children live locally for support.  PCP Lacinda Axon.  Patient has walker and cane in the home for ambulation.  Per daughter home health services through [facility] have been arranged from the outpatient office for pleurx management.  Notified Sarah with [facility] of discharge.  Judson Roch states that patient was arranged outpatient per protocol and would notify this RNCM if additional orders were required. Daughter states that patient has a follow up in Dr. Genevive Bi office this week.  RNCM signing off

## 2016-11-04 NOTE — Final Progress Note (Signed)
The patient states that he feels much better today. He does not have any shortness of breath or pain.  He has drained approximately 2.5 L since the Pleurx catheter was inserted. It is slightly pink tinged.  There is no air leak. The dressings are clean dry and intact. His lungs are diminished at both bases.  His chest x-ray shows a small right pleural effusion and a slightly larger left pleural effusion but no pneumothorax. The Pleurx catheters in good position.  The patient was instructed on how to manage his catheter. He and his family will be instructed by our nursing staff prior to discharge on how to manage his wound. He will come back to see [place] later this week in the office for continued follow-up.

## 2016-11-04 NOTE — Care Management CC44 (Signed)
Condition Code 44 Documentation Completed  Patient Details  Name: MAURIE OLESEN MRN: [id number] Date of Birth: Sep 04, 1923   Condition Code 44 given:    Patient signature on Condition Code 44 notice:    Documentation of 2 MD's agreement: yes   Code 44 added to claim:   yes    Beau Fanny, RN 11/04/2016, 10:42 AM

## 2016-11-04 NOTE — Progress Notes (Signed)
Pleural catheter capped off as ordered, dressing changed and daughter and son instructed on changing the dressing. Patient to receive home health visits for continued instruction on the dressing changes. IV saline lock discontinued skin intact, clean and dry. Patient is alert, ambulates with a cane, no acute distress noted.

## 2016-11-04 NOTE — Telephone Encounter (Signed)
Echo kind be done at any point when he is doing well recovering We do not generally take care of chest tube drainage We do not have vacuum bottles

## 2016-11-04 NOTE — Care Management Obs Status (Signed)
[place] NOTIFICATION   Patient Details  Name: Jason Wade MRN: [id number] Date of Birth: July 23, 1923   Medicare Observation Status Notification Given:  Yes    Katrina Stack, RN 11/04/2016, 11:25 AM

## 2016-11-04 NOTE — Care Management CC44 (Signed)
Condition Code 44 Documentation Completed  Patient Details  Name: Jason Wade MRN: [id number] Date of Birth: October 30, 1923   Condition Code 44 given:  Yes Patient signature on Condition Code 44 notice:  Yes Documentation of 2 MD's agreement:  Yes Code 44 added to claim:  Yes   Patient's son showed great displeasure regarding notice.  He tried to take a picture  CM delivering notice.   Katrina Stack, RN 11/04/2016, 11:25 AM

## 2016-11-05 NOTE — Telephone Encounter (Signed)
Spoke w/ [name].  Advised her of Dr. [name] recommendation.  She is appreciative of the call, but states that Amber in Dr. Genevive Bi' office has already taken care of this.  She voices frustration that she was "left out of the loop" by PCP in regards to her father's care. She states that Dr. Rockey Situ recommended he have "surgery on his valve" but they were referred to Dr. Genevive Bi, who also thought he would be a good candidate for this.  She states that she does not know where the breakdown in communication was, but she states that she is trusting that her father is receiving the right care at the right time.  She is appreciative of the call and will sched ECHO when pt is feeling up to it.  Asked her to call back w/ any further questions or concerns.

## 2016-11-05 NOTE — Telephone Encounter (Signed)
Left message w/ Deborah's husband for her to call back.

## 2016-11-06 ENCOUNTER — Ambulatory Visit (INDEPENDENT_AMBULATORY_CARE_PROVIDER_SITE_OTHER): Payer: 59

## 2016-11-06 DIAGNOSIS — J90 Pleural effusion, not elsewhere classified: Secondary | ICD-10-CM

## 2016-11-06 NOTE — Patient Instructions (Signed)
We will see you back next week as scheduled.  We will touch base with your home heath nurse today in regards to your drainage.  Please call with any questions or concerns prior to your appointment.

## 2016-11-06 NOTE — Progress Notes (Signed)
Patient was brought in via Wheelchair. Positive for SOB. Dressing removed, pleurx cleaned, and dressing reapplied. Vitals taken- 143/87, HR 83. Pleurx Drainage started at 0955. At 1014, patient began complaining of dizziness and nausea. At that time, 650cc of clear Darrian Grzelak colored fluid had been removed. Drainage stopped immediately. Pt's vitals were then 126/69 and HR 89. Dr. Genevive Bi was called and all information regarding patient's condition was reviewed. He advised to stop completely and recheck BP in 15 minutes. After 15 minutes, patient's vitals were then 126/84, HR 81 and he was asymptomatic. Patient was scheduled for suture removal with Dr. Genevive Bi next week with a Chest X-ray prior. We will redress and drain pleurx once again at that visit. Patient's daughter has been encouraged to call with any questions or concerns prior to scheduled appointment.

## 2016-11-07 ENCOUNTER — Telehealth: Payer: Self-pay

## 2016-11-07 NOTE — Telephone Encounter (Signed)
Call made to [facility] at this time. Patient's nurse is Judeen Hammans. Spoke with receptionist and she was given a message of what occurred yesterday in the office while draining patient's Pleurx catheter. Advised that she may not want to take more than 500 off per drainage to avoid the dizziness, nausea, lightheadness, and drop in BP that we experienced in the office after 650cc yesterday. She states that she will give detailed message to Nurse Judeen Hammans and have her call with any questions that she has.

## 2016-11-09 ENCOUNTER — Other Ambulatory Visit: Payer: Self-pay | Admitting: Family Medicine

## 2016-11-14 ENCOUNTER — Telehealth: Payer: Self-pay

## 2016-11-14 ENCOUNTER — Ambulatory Visit (INDEPENDENT_AMBULATORY_CARE_PROVIDER_SITE_OTHER): Payer: 59 | Admitting: Cardiothoracic Surgery

## 2016-11-14 ENCOUNTER — Encounter: Payer: 59 | Admitting: Cardiothoracic Surgery

## 2016-11-14 ENCOUNTER — Ambulatory Visit
Admission: RE | Admit: 2016-11-14 | Discharge: 2016-11-14 | Disposition: A | Discharge status: Home / Self Care | Payer: Medicare Other | Source: Ambulatory Visit | Attending: Cardiothoracic Surgery | Admitting: Cardiothoracic Surgery

## 2016-11-14 ENCOUNTER — Encounter: Payer: Self-pay | Admitting: Cardiothoracic Surgery

## 2016-11-14 VITALS — BP 98/61 | HR 84 | Temp 97.6°F

## 2016-11-14 DIAGNOSIS — J90 Pleural effusion, not elsewhere classified: Secondary | ICD-10-CM

## 2016-11-14 DIAGNOSIS — Z9889 Other specified postprocedural states: Secondary | ICD-10-CM | POA: Diagnosis not present

## 2016-11-14 NOTE — Telephone Encounter (Signed)
Call made to [facility]. Spoke with [employer].   I explained that patient's daughter was informed today that the dressing must be changed every other day. Ailene Ravel will also assist in education of patient's family. She was also told to consult with Dr. Lacinda Axon to manage catheter long term as patient will be discharged from our clinic next Friday.  She states that Insurance gives 6-8 weeks prior to patient being d/c'd by home health.  She has submitted an order to help with extra dressing supplies. 1-2 days depending on shipping before it will arrive at patient's home.  Spoke with patient's daughter at this time. Explained all information above. She is not thrilled with these answers but accepts them. She has been encouraged to call with any further questions or concerns that she has.

## 2016-11-14 NOTE — Patient Instructions (Addendum)
Continue to change your dressing twice daily. We will make a call to the Home Health Nurse with an update today.  If you have any questions, please call our office.  Please see your appointment below. You will not need a Chest X-ray prior to your appointment.

## 2016-11-21 ENCOUNTER — Ambulatory Visit (INDEPENDENT_AMBULATORY_CARE_PROVIDER_SITE_OTHER): Payer: Medicare Other | Admitting: Cardiothoracic Surgery

## 2016-11-21 ENCOUNTER — Encounter: Payer: Self-pay | Admitting: Cardiothoracic Surgery

## 2016-11-21 VITALS — BP 98/60 | HR 64 | Temp 97.6°F

## 2016-11-21 DIAGNOSIS — J90 Pleural effusion, not elsewhere classified: Secondary | ICD-10-CM

## 2016-11-21 NOTE — Patient Instructions (Signed)
[facility]  [address] [place], [place] [address]  [phone]   Dr. Parks Ranger

## 2016-11-25 NOTE — Progress Notes (Signed)
  Patient ID: Jason Wade, male   DOB: 07-18-1923, 83 y.o.   MRN: [id number]  HISTORY: He returns today in follow-up. He states that he is been quite poor recently. He has not noticed any significant improvement in his shortness of breath with or without thoracentesis.   Vitals:   11/14/16 1101  BP: 98/61  Pulse: 84  Temp: 97.6 F (36.4 C)     EXAM:  I have examined his Pleurx catheter. The site is clean dry and intact. There is no erythema. There is no drainage. The sutures were well approximated and I removed those.   ASSESSMENT: Patient will continue his follow-up with his primary care physician and cardiologist.   PLAN:   No follow-up appointment was made at this time. I would be happy to see him should the need arise    [name], MD

## 2016-11-26 NOTE — Progress Notes (Signed)
  Patient ID: Jason Wade, male   DOB: 04-Oct-1923, 83 y.o.   MRN: [id number]  HISTORY: No issues with PleurX catheter.  Still draining without incident.    Vitals:   11/21/16 1012  BP: 98/60  Pulse: 64  Temp: 97.6 F (36.4 C)     EXAM:    Lungs sounded clear bilaterally Wounds are clean and dry Suture removed     ASSESSMENT: Status post PleurX for recurrent pleural effusion   PLAN:   followup with primary care physician and cardiology    [name], MD

## 2016-11-27 ENCOUNTER — Encounter: Payer: Self-pay | Admitting: Family Medicine

## 2016-11-27 ENCOUNTER — Telehealth: Payer: Self-pay

## 2016-11-27 NOTE — Telephone Encounter (Signed)
Angel from [facility] called to state she has removed 1078ml of fluid and wanted to know should she continue or come back out tomorrow and draw more fluid then. Patient is asymptomatic.  Spoke with [employer] and Glenard Haring was instructed to stop for today and come back out tomorrow and be sure to take vitals.

## 2016-11-28 ENCOUNTER — Encounter: Payer: Self-pay | Admitting: Cardiovascular Disease

## 2016-12-01 ENCOUNTER — Telehealth: Payer: Self-pay | Admitting: General Practice

## 2016-12-01 ENCOUNTER — Other Ambulatory Visit: Payer: Medicare Other

## 2016-12-01 NOTE — Telephone Encounter (Signed)
Returned phone call to Patient's daughter. She has spoken with the patient's insurance and states that [facility] can come out as long as PT comes once weekly. Patient's daughter states that her father is doing wonderful with physical therapy and is requesting that we continue care as long as possible to help patient progress sufficiently.  Will speak with Home Health in regards to this and give additional orders if needed.

## 2016-12-01 NOTE — Telephone Encounter (Signed)
Patients daughter is calling asking if one of the nurses will give her a call please call patient and advice.

## 2016-12-02 ENCOUNTER — Telehealth: Payer: Self-pay | Admitting: Family Medicine

## 2016-12-02 NOTE — Telephone Encounter (Signed)
Left pt message asking to call Allison back directly at [phone] to schedule AWV. Thanks! ° °*NOTE* Never had AWV before °

## 2016-12-02 NOTE — Telephone Encounter (Signed)
Pt will have daughter call me back.

## 2016-12-04 ENCOUNTER — Telehealth: Payer: Self-pay | Admitting: Cardiothoracic Surgery

## 2016-12-04 NOTE — Telephone Encounter (Signed)
Angel from [facility] called to state she has removed 1011ml of fluid and would like to know what to do as of now with orders. Please call her at the patient's residence. She does not have her phone on her and will be at the residence for the next 15 minutes.

## 2016-12-04 NOTE — Telephone Encounter (Signed)
Spoke with Glenard Haring [facility] nurse at this time. Patient is a protocol patient and needs to be drained every other day with dressing changes per [employer].

## 2016-12-05 ENCOUNTER — Other Ambulatory Visit: Payer: Self-pay

## 2016-12-05 ENCOUNTER — Ambulatory Visit (INDEPENDENT_AMBULATORY_CARE_PROVIDER_SITE_OTHER): Payer: Medicare Other

## 2016-12-05 DIAGNOSIS — I350 Nonrheumatic aortic (valve) stenosis: Secondary | ICD-10-CM

## 2016-12-05 DIAGNOSIS — I5032 Chronic diastolic (congestive) heart failure: Secondary | ICD-10-CM | POA: Diagnosis not present

## 2016-12-05 DIAGNOSIS — I480 Paroxysmal atrial fibrillation: Secondary | ICD-10-CM

## 2016-12-10 ENCOUNTER — Other Ambulatory Visit: Payer: Self-pay | Admitting: Family Medicine

## 2016-12-11 ENCOUNTER — Telehealth: Payer: Self-pay | Admitting: Cardiovascular Disease

## 2016-12-11 NOTE — Telephone Encounter (Signed)
Patient daughter calling for echo results.  Please call.

## 2016-12-11 NOTE — Telephone Encounter (Signed)
Patients daughter calling wanting her fathers echocardiogram results. She states that he has the Pleurx drain and he is currently having that done every other day and it is significant amount of fluid. She reports that she is anxious to know the echo results to see if he is a cainadate for possible surgery to help reduce the amount of fluid that is collecting. Let her know that I would send message to Dr. Rockey Situ regarding results and will give her a call back. She was very appreciative for the call and had no further questions at this time.

## 2016-12-12 NOTE — Telephone Encounter (Signed)
Reviewed results and options with patients daughter and she verbalized understanding of the options. She reports that her father did not want to do anything requiring multiple appointments or hospital stays. She did state that she would review this information and options with him. We spoke extensively regarding his current health and frequent fluid drainage. She verbalized understanding and was very appreciative of the time and information. Instructed her to give [place] a call back if they would like to proceed or have any further questions.

## 2016-12-12 NOTE — Telephone Encounter (Signed)
Note sent

## 2016-12-12 NOTE — Telephone Encounter (Signed)
-----   Message from Minna Merritts, MD sent at 12/12/2016  4:48 PM EDT ----- Echocardiogram reviewed Again this confirms heavy calcified aortic valve with restricted mobility Appears to have severe stenosis Measurements are somewhat contradictory Visually appears consistent with 2017 Would consider referral to TAVR clinic for evaluation. They can determine if transesophageal echo is needed, cardiac catheterization needed, CT scan needed in preparation Fluid in the lung could be coming from aortic valve, unable to exclude contribution from underlying atrial fibrillation/diastolic dysfunction

## 2016-12-20 ENCOUNTER — Emergency Department: Payer: Medicare Other

## 2016-12-20 ENCOUNTER — Emergency Department
Admission: EM | Admit: 2016-12-20 | Discharge: 2016-12-21 | Disposition: A | Discharge status: Home / Self Care | Payer: Medicare Other | Attending: Emergency Medicine | Admitting: Emergency Medicine

## 2016-12-20 ENCOUNTER — Encounter: Payer: Self-pay | Admitting: Emergency Medicine

## 2016-12-20 DIAGNOSIS — Z79899 Other long term (current) drug therapy: Secondary | ICD-10-CM | POA: Insufficient documentation

## 2016-12-20 DIAGNOSIS — W010XXA Fall on same level from slipping, tripping and stumbling without subsequent striking against object, initial encounter: Secondary | ICD-10-CM | POA: Diagnosis not present

## 2016-12-20 DIAGNOSIS — S0001XA Abrasion of scalp, initial encounter: Secondary | ICD-10-CM | POA: Insufficient documentation

## 2016-12-20 DIAGNOSIS — E039 Hypothyroidism, unspecified: Secondary | ICD-10-CM | POA: Diagnosis not present

## 2016-12-20 DIAGNOSIS — Z85828 Personal history of other malignant neoplasm of skin: Secondary | ICD-10-CM | POA: Insufficient documentation

## 2016-12-20 DIAGNOSIS — S0990XA Unspecified injury of head, initial encounter: Secondary | ICD-10-CM | POA: Diagnosis not present

## 2016-12-20 DIAGNOSIS — Y92009 Unspecified place in unspecified non-institutional (private) residence as the place of occurrence of the external cause: Secondary | ICD-10-CM | POA: Insufficient documentation

## 2016-12-20 DIAGNOSIS — Y939 Activity, unspecified: Secondary | ICD-10-CM | POA: Diagnosis not present

## 2016-12-20 DIAGNOSIS — I129 Hypertensive chronic kidney disease with stage 1 through stage 4 chronic kidney disease, or unspecified chronic kidney disease: Secondary | ICD-10-CM | POA: Insufficient documentation

## 2016-12-20 DIAGNOSIS — Z23 Encounter for immunization: Secondary | ICD-10-CM | POA: Insufficient documentation

## 2016-12-20 DIAGNOSIS — T148XXA Other injury of unspecified body region, initial encounter: Secondary | ICD-10-CM

## 2016-12-20 DIAGNOSIS — Y999 Unspecified external cause status: Secondary | ICD-10-CM | POA: Diagnosis not present

## 2016-12-20 DIAGNOSIS — W19XXXA Unspecified fall, initial encounter: Secondary | ICD-10-CM

## 2016-12-20 DIAGNOSIS — N182 Chronic kidney disease, stage 2 (mild): Secondary | ICD-10-CM | POA: Diagnosis not present

## 2016-12-20 MED ORDER — TETANUS-DIPHTH-ACELL PERTUSSIS 5-2.5-18.5 LF-MCG/0.5 IM SUSP
0.5000 mL | Freq: Once | INTRAMUSCULAR | Status: AC
  Administered 2016-12-20: 0.5 mL via INTRAMUSCULAR
  Filled 2016-12-20: qty 0.5

## 2016-12-20 NOTE — ED Notes (Signed)
Pt head wrapped with nonstick gauze and wrap.

## 2016-12-20 NOTE — ED Notes (Signed)
Patient transported to X-ray 

## 2016-12-20 NOTE — ED Notes (Signed)
Family given waters.

## 2016-12-20 NOTE — ED Triage Notes (Signed)
Pt to ED via [facility] from home c/o fall tonight.  EMS states knees gave out and patient fell, hitting head, denies LOC, taking xeralto, hx of CHF.  Pt presents with skin tear to top of head, bleeding controlled, A&Ox4, speaking in complete and coherent sentences, chest rise even and unlabored.

## 2016-12-20 NOTE — ED Provider Notes (Signed)
[facility] Emergency Department Provider Note  ____________________________________________  Time seen: Approximately 10:00 PM  I have reviewed the triage vital signs and the nursing notes.   HISTORY  Chief Complaint Fall    HPI EHSAN CORVIN is a 83 y.o. male who is in his usual state of health walking at home when he reports his knees gave out and buckled causing him to fall. He hit the top of his head. No loss of consciousness. Has some headache at the top of the head, but no neck pain or other pain. He does have chronic low back pain which is unchanged according to him. Because of his back issues he had to crawl to get himself back to her recliner. No vomiting vision changes numbness tingling or weakness. Not sure when his last tetanus shot was. Pain is constant moderate intensity since the fall, no aggravating or alleviating factors.  patient takes Marga Hoots did take his Lesleigh Noe about 5:30 PM today.   Past Medical History:  Diagnosis Date  . BPH (benign prostatic hyperplasia)   . Carotid arterial disease ([place])    a. 2015: s/p R CEA.  . Chronic diastolic CHF (congestive heart failure) ([place])    a. echo 11/2015: EF 55-60%, possible HK of inf wall, severe AS mean gradient 29, peak gradient 45, valve area 0.41, mild to mod MR, LA dilated @ 50 mild RA dilatation, left pleural effusion  . Chronic low back pain   . CKD (chronic kidney disease), stage II   . Dyspnea   . Essential hypertension   . Falls   . Gout   . Great toe pain, right 08/30/2016  . HLD (hyperlipidemia)   . Hypothyroidism   . Lower extremity neuropathy   . Neuropathy   . PAF (paroxysmal atrial fibrillation) ([place])    a. 11/2010 Echo: EF >55%, diast dysfxn, mild conc lvh.  . Severe aortic stenosis    a. echo 11/2015 w/ mean gradient 29 mmHg, peak gradient 45 mmHg, valve area 0.41 cm^2  . Squamous cell carcinoma of skin    a. x3 s/p local excision followed by more extensive surgery due  to lymphadenopathy on the right.     Patient Active Problem List   Diagnosis Date Noted  . Pleural effusion 11/04/2016  . Recurrent pleural effusion on left 11/03/2016  . Great toe pain, right 08/30/2016  . Pleural effusion, left 08/11/2016  . Carotid arterial disease ([place])   . Chronic diastolic CHF (congestive heart failure) ([place])   . CKD (chronic kidney disease), stage II   . Essential hypertension   . Lower extremity neuropathy   . PAF (paroxysmal atrial fibrillation) ([place])   . Nonrheumatic aortic valve stenosis   . BPH (benign prostatic hyperplasia) 06/05/2015  . Hypothyroidism 06/05/2015  . Hyperlipidemia 05/05/2014  . Squamous cell carcinoma of skin of right arm, including shoulder 04/22/2013     Past Surgical History:  Procedure Laterality Date  . CARDIAC CATHETERIZATION  04-08-2005  . CAROTID ENDARTERECTOMY    . CHEST TUBE INSERTION Left 11/03/2016   Procedure: CHEST TUBE INSERTION;  Surgeon: [name], MD;  Location: [facility] ORS;  Service: General;  Laterality: Left;  . KNEE SURGERY  02-09-2007   replacement  . LAMINECTOMY AND MICRODISCECTOMY LUMBAR SPINE      Bilateral  at L4-5 followed by L4-5 right microdiskectomy.  Marland Kitchen LYMPH NODE DISSECTION       Prior to Admission medications   Medication Sig Start Date End Date Taking? Authorizing Provider  allopurinol (ZYLOPRIM) 100 MG tablet Take 1 tablet (100 mg total) by mouth daily. 09/16/16   Coral Spikes, DO  cloNIDine (CATAPRES) 0.3 MG tablet Take 0.3 mg by mouth 2 (two) times daily.    [provider]  colchicine 0.6 MG tablet 1.2 mg once, then 0.6 mg 1 hour after. Then 0.6 mg daily. 09/16/16   Coral Spikes, DO  furosemide (LASIX) 20 MG tablet Take 1 tablet (20 mg total) by mouth daily. 10/02/16   Coral Spikes, DO  hydrochlorothiazide (HYDRODIURIL) 25 MG tablet TAKE 1 TABLET (25 MG TOTAL) BY MOUTH DAILY. 05/27/16   Minna Merritts, MD  metoprolol succinate (TOPROL-XL) 100 MG 24 hr tablet TAKE 1 TABLET DAILY  TAKE   WITH OR IMMEDIATELY        FOLLOWING A MEAL 07/28/16   Gollan, Kathlene November, MD  potassium chloride (K-DUR) 10 MEQ tablet Take 1 tablet (10 mEq total) by mouth daily. Take with Lasix 08/08/16 11/06/16  Minna Merritts, MD  simvastatin (ZOCOR) 20 MG tablet TAKE 1 TABLET AT BEDTIME 04/29/16   Cook, [place], DO  SYNTHROID 100 MCG tablet TAKE 1 TABLET DAILY 11/10/16   Thersa Salt G, DO  terazosin (HYTRIN) 2 MG capsule TAKE 1 CAPSULE AT BEDTIME 12/10/16   Cook, [place] G, DO  XARELTO 20 MG TABS tablet TAKE 1 TABLET DAILY WITH   SUPPER 07/28/16   Minna Merritts, MD     Allergies Patient has no known allergies.   Family History  Problem Relation Age of Onset  . Hypertension Mother   . Stroke Mother     Social History Social History  Substance Use Topics  . Smoking status: Never Smoker  . Smokeless tobacco: Never Used  . Alcohol use No    Review of Systems  Constitutional:   No fever or chills.  ENT:   No sore throat. No rhinorrhea. Cardiovascular:   No chest pain or syncope.recurrent pleural effusions, has a Pleurx catheter. Respiratory:   No dyspnea or cough. Gastrointestinal:   Negative for abdominal pain, vomiting and diarrhea.  Musculoskeletal:   chronic low back pain All other systems reviewed and are negative except as documented above in ROS and HPI.  ____________________________________________   PHYSICAL EXAM:  VITAL SIGNS: ED Triage Vitals  Enc Vitals Group     BP 12/20/16 2137 112/61     Pulse Rate 12/20/16 2137 (!) 125     Resp 12/20/16 2137 18     Temp 12/20/16 2137 97.6 F (36.4 C)     Temp Source 12/20/16 2137 Oral     SpO2 12/20/16 2137 100 %     Weight 12/20/16 2143 160 lb (72.6 kg)     Height 12/20/16 2143 5\' 5"  (1.651 m)     Head Circumference --      Peak Flow --      Pain Score 12/20/16 2138 2     Pain Loc --      Pain Edu? --      Excl. in [place]? --     Vital signs reviewed, nursing assessments reviewed.   Constitutional:   Alert and oriented.  Well appearing and in no distress. Eyes:   No scleral icterus.  EOMI. No nystagmus. No conjunctival pallor. PERRL. ENT   Head:   Normocephalic with abrasion and skin tear at the apex of the scalp.no laceration. Hemostatic.   Nose:   No congestion/rhinnorhea.    Mouth/Throat:   MMM, no pharyngeal erythema.  No peritonsillar mass.    Neck:   No meningismus. Full ROM. No midline spinal tenderness Hematological/Lymphatic/Immunilogical:   No cervical lymphadenopathy. Cardiovascular:   RRR. Symmetric bilateral radial and DP pulses.  No murmurs.  Respiratory:   Normal respiratory effort without tachypnea/retractions. Breath sounds are clear and equal bilaterally. No wheezes/rales/rhonchi. Gastrointestinal:   Soft and nontender. Non distended. There is no CVA tenderness.  No rebound, rigidity, or guarding. Genitourinary:   deferred Musculoskeletal:   Normal range of motion in all extremities. No joint effusions.  No lower extremity tenderness.  No edema.no midline tenderness Neurologic:   Normal speech and language.  Motor grossly intact. No gross focal neurologic deficits are appreciated.  Skin:    Skin is warm, dry and intact. No rash noted.  No petechiae, purpura, or bullae.  ____________________________________________    LABS (pertinent positives/negatives) (all labs ordered are listed, but only abnormal results are displayed) Labs Reviewed - No data to display ____________________________________________   EKG  interpreted by me Atrial fibrillation rate of 65, normal axis and intervals. Poor R-wave progression in anterior precordial leads. Normal ST segments. LVH with associated T wave inversions in lateral leads. Unchanged from 10/27/2016  repeat EKG performed at 9:55 PM, atrial fibrillation rate of 67, unchanged. ____________________________________________    RADIOLOGY  Dg Chest 2 View  Result Date: 12/20/2016 CLINICAL DATA:  Left PleurX catheter in place. Patient  fell onto left side this evening. Pain. EXAM: CHEST  2 VIEW COMPARISON:  11/14/2016 FINDINGS: Stable cardiomegaly with aortic atherosclerosis. Chronic blunting of the left costophrenic angle presumably from small to moderate left effusion, slightly increased since prior with PleurX catheter projecting at the left lung base. No overt pulmonary edema. No acute osseous abnormality. IMPRESSION: 1. Stable cardiomegaly with aortic atherosclerosis. 2. Pleurx catheter at the left lung base with small to moderate left effusion, slightly increased since prior. 3. No acute osseous appearing abnormality. Electronically Signed   By: Ashley Royalty M.D.   On: 12/20/2016 22:41   Dg Lumbar Spine 2-3 Views  Result Date: 12/20/2016 CLINICAL DATA:  Fall EXAM: LUMBAR SPINE - 2-3 VIEW COMPARISON:  MRI 01/17/2010 FINDINGS: Straightening of the lumbar spine. Chronic superior end plate deformity at L2. Remaining vertebral body heights are maintained. Advanced degenerative changes L2 through S1 with vacuum disc, endplate change and osteophyte. Mild scoliosis. Vascular calcifications. IMPRESSION: Marked degenerative changes of the spine with chronic superior endplate depression at L2. No definite acute osseous abnormality Electronically Signed   By: Donavan Foil M.D.   On: 12/20/2016 22:45   Ct Head Wo Contrast  Result Date: 12/20/2016 CLINICAL DATA:  Patient fell this evening but knees gave out. Patient hit head with skin tear to top of head. Patient is on anticoagulation. EXAM: CT HEAD WITHOUT CONTRAST CT CERVICAL SPINE WITHOUT CONTRAST TECHNIQUE: Multidetector CT imaging of the head and cervical spine was performed following the standard protocol without intravenous contrast. Multiplanar CT image reconstructions of the cervical spine were also generated. COMPARISON:  Head CT 03/15/2012, neck CT 05/13/2013 FINDINGS: CT HEAD FINDINGS Brain: Mild superficial and central atrophy. No acute intracranial hemorrhage, midline shift or edema.  No intra-axial mass nor extra-axial fluid collections. Chronic appearing small vessel ischemic disease of periventricular and subcortical white matter. No effacement of the basal cisterns or fourth ventricle. Vascular: Moderate atherosclerosis of the carotid siphons. No hyperdense vessels. Skull: No acute skull fracture or suspicious osseous lesions. Sinuses/Orbits: No acute finding. Other: Left-sided scalp contusion and laceration near the vertex of skull. CT  CERVICAL SPINE FINDINGS Alignment: Normal. Skull base and vertebrae: No acute fracture. No primary bone lesion or focal pathologic process. Calcified transverse ligament about the odontoid process with osteoarthritic joint space narrowing of the atlantodental interval. Degenerative subcortical cysts are noted of the odontoid. Soft tissues and spinal canal: No prevertebral fluid or swelling. No visible canal hematoma. Disc levels: Mild C2-3 and C3-4 disc space narrowing with small posterior marginal osteophytes at C3-4. Moderate to marked disc space narrowing with discogenic sclerosis at C4-5 with marked disc space narrowing C5-6 and C6-7. Small marginal osteophytes are seen at C4-5, C5-6 and C6-7. No significant central canal stenosis. Slight grade 1 retrolisthesis of C4 on C5, stable in appearance. Bilateral uncovertebral joint spurring and osteoarthritic joint space narrowing C2 through C7. Variable degrees of bilateral neural foraminal narrowing as a result of the uncovertebral and endplate osteophytes. Upper chest: Negative. Other: None IMPRESSION: 1. Atrophy with chronic small vessel ischemic disease of periventricular white matter. No acute intracranial abnormality or hemorrhage noted. 2. Scalp contusion with laceration near the vertex of the skull on the left. No fracture identified. 3. Cervical spondylosis without acute fracture or posttraumatic subluxations. Electronically Signed   By: Ashley Royalty M.D.   On: 12/20/2016 22:53   Ct Cervical Spine Wo  Contrast  Result Date: 12/20/2016 CLINICAL DATA:  Patient fell this evening but knees gave out. Patient hit head with skin tear to top of head. Patient is on anticoagulation. EXAM: CT HEAD WITHOUT CONTRAST CT CERVICAL SPINE WITHOUT CONTRAST TECHNIQUE: Multidetector CT imaging of the head and cervical spine was performed following the standard protocol without intravenous contrast. Multiplanar CT image reconstructions of the cervical spine were also generated. COMPARISON:  Head CT 03/15/2012, neck CT 05/13/2013 FINDINGS: CT HEAD FINDINGS Brain: Mild superficial and central atrophy. No acute intracranial hemorrhage, midline shift or edema. No intra-axial mass nor extra-axial fluid collections. Chronic appearing small vessel ischemic disease of periventricular and subcortical white matter. No effacement of the basal cisterns or fourth ventricle. Vascular: Moderate atherosclerosis of the carotid siphons. No hyperdense vessels. Skull: No acute skull fracture or suspicious osseous lesions. Sinuses/Orbits: No acute finding. Other: Left-sided scalp contusion and laceration near the vertex of skull. CT CERVICAL SPINE FINDINGS Alignment: Normal. Skull base and vertebrae: No acute fracture. No primary bone lesion or focal pathologic process. Calcified transverse ligament about the odontoid process with osteoarthritic joint space narrowing of the atlantodental interval. Degenerative subcortical cysts are noted of the odontoid. Soft tissues and spinal canal: No prevertebral fluid or swelling. No visible canal hematoma. Disc levels: Mild C2-3 and C3-4 disc space narrowing with small posterior marginal osteophytes at C3-4. Moderate to marked disc space narrowing with discogenic sclerosis at C4-5 with marked disc space narrowing C5-6 and C6-7. Small marginal osteophytes are seen at C4-5, C5-6 and C6-7. No significant central canal stenosis. Slight grade 1 retrolisthesis of C4 on C5, stable in appearance. Bilateral uncovertebral  joint spurring and osteoarthritic joint space narrowing C2 through C7. Variable degrees of bilateral neural foraminal narrowing as a result of the uncovertebral and endplate osteophytes. Upper chest: Negative. Other: None IMPRESSION: 1. Atrophy with chronic small vessel ischemic disease of periventricular white matter. No acute intracranial abnormality or hemorrhage noted. 2. Scalp contusion with laceration near the vertex of the skull on the left. No fracture identified. 3. Cervical spondylosis without acute fracture or posttraumatic subluxations. Electronically Signed   By: Ashley Royalty M.D.   On: 12/20/2016 22:53    ____________________________________________   PROCEDURES Procedures  ____________________________________________   INITIAL IMPRESSION / ASSESSMENT AND PLAN / ED COURSE  Pertinent labs & imaging results that were available during my care of the patient were reviewed by me and considered in my medical decision making (see chart for details).  patient presents with minor trauma to the head on anticoagulants. We'll get CT scan head neck, chest x-ray and lumbar spine x-ray. EKG nonacute. I will get a tetanus vaccination today since we can't verify last administration. If imaging is nonacute patient will be suitable for discharge home.     ----------------------------------------- 11:11 PM on 12/20/2016 -----------------------------------------  Imaging negative. Patient suitable for discharge home. The CT head reports that the patient has a scalp laceration, but on direct inspection he does not.  ____________________________________________   FINAL CLINICAL IMPRESSION(S) / ED DIAGNOSES  Final diagnoses:  Fall, initial encounter  Injury of head, initial encounter  Abrasion      New Prescriptions   No medications on file     Portions of this note were generated with dragon dictation software. Dictation errors may occur despite best attempts at proofreading.     Carrie Mew, MD 12/20/16 2312

## 2016-12-29 ENCOUNTER — Encounter: Payer: Self-pay | Admitting: Family Medicine

## 2016-12-29 ENCOUNTER — Ambulatory Visit (INDEPENDENT_AMBULATORY_CARE_PROVIDER_SITE_OTHER): Payer: Medicare Other | Admitting: Family Medicine

## 2016-12-29 VITALS — BP 100/76 | HR 76 | Temp 97.6°F | Resp 16 | Ht 68.0 in | Wt 161.2 lb

## 2016-12-29 DIAGNOSIS — I739 Peripheral vascular disease, unspecified: Secondary | ICD-10-CM

## 2016-12-29 DIAGNOSIS — I480 Paroxysmal atrial fibrillation: Secondary | ICD-10-CM

## 2016-12-29 DIAGNOSIS — I779 Disorder of arteries and arterioles, unspecified: Secondary | ICD-10-CM

## 2016-12-29 DIAGNOSIS — Z23 Encounter for immunization: Secondary | ICD-10-CM | POA: Diagnosis not present

## 2016-12-29 DIAGNOSIS — I5032 Chronic diastolic (congestive) heart failure: Secondary | ICD-10-CM | POA: Diagnosis not present

## 2016-12-29 DIAGNOSIS — C44622 Squamous cell carcinoma of skin of right upper limb, including shoulder: Secondary | ICD-10-CM

## 2016-12-29 DIAGNOSIS — Z7689 Persons encountering health services in other specified circumstances: Secondary | ICD-10-CM

## 2016-12-29 DIAGNOSIS — I10 Essential (primary) hypertension: Secondary | ICD-10-CM | POA: Diagnosis not present

## 2016-12-29 DIAGNOSIS — I350 Nonrheumatic aortic (valve) stenosis: Secondary | ICD-10-CM | POA: Diagnosis not present

## 2016-12-29 DIAGNOSIS — J90 Pleural effusion, not elsewhere classified: Secondary | ICD-10-CM

## 2016-12-29 NOTE — Assessment & Plan Note (Signed)
Stable chronic problem Followed by Cardiology Dr Rockey Situ Continue current meds, including Metoprolol rate control

## 2016-12-29 NOTE — Assessment & Plan Note (Signed)
Complex issue with recurrences now for >5 months, s/p L pleurex catheter drain, x 3 weekly Still dyspnea. No significant edema Continues Lasix 20mg  QOD Followed by Gen Surgery / Cards May consider future inc drainage if needed x 4 weekly, if not improving

## 2016-12-29 NOTE — Patient Instructions (Addendum)
Thank you for coming to the clinic today.  1. I am concerned about low BP, I agree that it is "controlled" or normal but it is on the LOW end of normal. I would recommend the following changes to BP medications, please call Dr Rockey Situ to review these and see if he agrees or you can schedule to see him in next 3-4 weeks to follow-up (last seen 07/2016, next due apt soon  REDUCE Hydrochlorothiazide HCTZ 25mg  to HALF TABLET - 12.5mg  dose daily  REDUCE frequency of Furosemide (Lasix) 20mg  - instead of every OTHER day, can switch to EVERY THIRD day  Keep up the good work on the scalp laceration, it is healing well.  We will call [facility] to request extended PT.  Please schedule a Follow-up Appointment to: Return in about 3 months (around 03/30/2017) for HTN, CHF Swelling, BPH.  If you have any other questions or concerns, please feel free to call the clinic or send a message through [place]. You may also schedule an earlier appointment if necessary.  Additionally, you may be receiving a survey about your experience at our clinic within a few days to 1 week by e-mail or mail. We value your feedback.  Nobie Putnam, DO [place]

## 2016-12-29 NOTE — Assessment & Plan Note (Signed)
Clinically euvolemic, with persistent chronic dyspnea, complicated by recurrent L pleural effusion, also worsening Aortic Stenosis - Followed by Cardiology Dr Rockey Situ - Last ECHO 11/2016, with some concern worsening AS severity, LVEF 45-55%  Plan: 1. Discussed prior history, as new provider today no medication changes, advised he is due to see Dr Rockey Situ again in about 3-4 weeks, for 6 month follow-up, can review ECHO recommendations again. He understands he would be need TAVR surgery for repair, likely contributing to some of his symptoms, but from prior discussions with PCP and other considerations he is concerned due to high risk. 2. Continue Lasix 20mg  every other day for now - advised we may consider every 3rd day if concern too dry and low BP at times contributing to fall, may need to be only if swelling or worse dyspnea, will defer change to Cardiology, per patient 3. Follow-up PRN

## 2016-12-29 NOTE — Assessment & Plan Note (Signed)
Stable, known PAD in high risk cardiac patient S/p R-CEA

## 2016-12-29 NOTE — Assessment & Plan Note (Signed)
Controlled BP, concern may be slightly too low with issues of lightheaded and symptomatic, recent fall Complications with PAD, AS, PAF, CKD  Plan: 1. Discussed med review with patient/family - made recommendations for a few changes, but agree to hold for now until he discusses with Dr Rockey Situ, since I am just meeting him for first time today. I recommended may HALF HCTZ from 25 to 12.5mg  daily and may space Lasix to every 3rd day. Also concern that Terazosin may be contributing to lightheadedness or postural hypotension as well, but he needs some level of alpha blocker for BPH, limited options

## 2016-12-29 NOTE — Assessment & Plan Note (Addendum)
Stable, without recurrence S/p sequela of lymph node dissection 29 nodes R axillary arm/upper ext, now on chronic compression RUE Followed by Dr Maryellen Pile

## 2016-12-29 NOTE — Assessment & Plan Note (Signed)
Worsening AS severity on last ECHO, with persistent chronic dyspnea, complicated by recurrent L pleural effusion, dCHF - Followed by Cardiology Dr Rockey Situ - Last ECHO 11/2016, with some concern worsening AS severity, LVEF 45-55%  Plan: 1. Discussed prior history, as new provider today no medication changes, advised he is due to see Dr Rockey Situ again in about 3-4 weeks, for 6 month follow-up, can review ECHO recommendations again. He understands he would be need TAVR surgery for repair, likely contributing to some of his symptoms, but from prior discussions with PCP and other considerations he is concerned due to high risk. 2. Continue Lasix 20mg  every other day for now - advised we may consider every 3rd day if concern too dry and low BP at times contributing to fall, may need to be only if swelling or worse dyspnea, will defer change to Cardiology, per patient 3. Follow-up PRN

## 2016-12-29 NOTE — Progress Notes (Signed)
Subjective:    Patient ID: Jason Wade, male    DOB: 1924/04/19, 83 y.o.   MRN: [id number]  Jason Wade is a 83 y.o. male presenting on 12/29/2016 for Establish Care  Patient presents today for new patient evaluation, he provides majority of history, and is accompanied by his daughter, [name] Cindy Hazy) and her spouse, Vesta Mixer.  His previous PCP was Dr Thersa Salt at [facility], who has moved to [facility].  HPI   Specialists: Cardiology - Dr Ida Rogue [facility]) General Surgery - Dr [name] ([facility])  Chronic Diastolic CHF / Aortic Valve Stenosis (Moderate), Paroxysmal Atrial Fibrillation / PAD carotids s/p R endarterectomy / HTN - Followed by Cardiology, last seen by Dr Rockey Situ 07/2016, had EKG done in office showed AFib. Has had yearly ECHOcardiograms, previously Aug 2017, and most recent done in Aug 2018 showed severe AS, with concerns may need TEE CT and other eval, in the past he had indicated that he does not wish to proceed with AS surgery, would be considered TAVR, concern with high risk. - He has had well controlled HTN recently, in past admits it was very difficult to control, requiring several meds, family history of hard to control HTN. Now new concern today family asking about too much HTN medication causing his BP to be low and contributing to fall potentially - Taking Lasix 20mg  every other day for dyspnea, without significant LE edema, also was on this for pleural effusion - Asking about resuming HH PT again, previously completed this in August - Admits still has dyspnea often  Chronic Recurrent L Pleural Effusion, s/p Pleurex Catheter drain - Reviewed background info, initially dx in 07/2016 on CXR, L sided, continued Lasix and repeat imaging with persistent L pleural effusion despite lasix, proceeded with Thoracentesis 09/10/16, further monitoring had worsening and recurrent pleural effusion, referred to general surgery Dr  Genevive Bi for pleurx catheter, he was ultimately admitted for chest tube placement on 11/03/16, had home health [facility] on discharge, trained family in draining pleurex catheter - Today states doing well with draining fluid, says needs to be drained soon, normally only drain 3 x weekly, asking if need to be drained more - Still receives [facility] RN  History of Squamous Cell Skin cancer R axillary/Chest, s/p excision, lymph node removal / 2/2 RUE Lymphedema - Prior history of chemo and surgery in 2012, repeat concern in 2016, followed by Dr Nehemiah Massed - He is doing well now, he has chronic recurrent RUE Lymphedema, wears compression device on R arm overnight, and compression sleeve during day which controls symptoms  Fall, Scalp Abrasion - Recent event with fall at home on 12/20/16, he had similar fall 05/2016, he fell backwards after slightly lightheaded hit head on doorframe, had woundcare at home, with improvement, still has superficial laceration of scalp some bruising, changes dressing frequently - Asking about feeling generalized weakness still and may need to resume [facility], this ended 2 weeks ago, through [facility]  PMH - Additionally chronic back pain OA/DJD, s/p spine surgery 2004, repeat worsening in 2011, declined further surgery due to concern high risk. Admits chronic residual tingling and numbness in feet and hands at times.  Social History - Lives at home alone, has close family support system with daughter [name] lives 10 min away, is often checking on him 2x daily, and increased on weekend.  Past Medical History:  Diagnosis Date  . BPH (benign prostatic hyperplasia)   . Carotid arterial  disease ([place])    a. 2015: s/p R CEA.  . Chronic diastolic CHF (congestive heart failure) ([place])    a. echo 11/2015: EF 55-60%, possible HK of inf wall, severe AS mean gradient 29, peak gradient 45, valve area 0.41, mild to mod MR, LA dilated @ 50 mild RA dilatation, left pleural effusion  . Chronic low back pain    . CKD (chronic kidney disease), stage II   . Dyspnea   . Essential hypertension   . Falls   . Gout   . Great toe pain, right 08/30/2016  . HLD (hyperlipidemia)   . Hypothyroidism   . Lower extremity neuropathy   . Neuropathy   . PAF (paroxysmal atrial fibrillation) ([place])    a. 11/2010 Echo: EF >55%, diast dysfxn, mild conc lvh.  . Severe aortic stenosis    a. echo 11/2015 w/ mean gradient 29 mmHg, peak gradient 45 mmHg, valve area 0.41 cm^2  . Squamous cell carcinoma of skin    a. x3 s/p local excision followed by more extensive surgery due to lymphadenopathy on the right.   Past Surgical History:  Procedure Laterality Date  . CARDIAC CATHETERIZATION  04-08-2005  . CAROTID ENDARTERECTOMY    . CHEST TUBE INSERTION Left 11/03/2016   Procedure: CHEST TUBE INSERTION;  Surgeon: [name], MD;  Location: [facility] ORS;  Service: General;  Laterality: Left;  . KNEE SURGERY  02-09-2007   replacement  . LAMINECTOMY AND MICRODISCECTOMY LUMBAR SPINE      Bilateral  at L4-5 followed by L4-5 right microdiskectomy.  Marland Kitchen LYMPH NODE DISSECTION     Social History   Social History  . Marital status: Married    Spouse name: N/A  . Number of children: N/A  . Years of education: N/A   Occupational History  . Not on file.   Social History Main Topics  . Smoking status: Never Smoker  . Smokeless tobacco: Never Used  . Alcohol use No  . Drug use: No  . Sexual activity: Yes   Other Topics Concern  . Not on file   Social History Narrative  . No narrative on file   Family History  Problem Relation Age of Onset  . Hypertension Mother   . Stroke Mother    Current Outpatient Prescriptions on File Prior to Visit  Medication Sig  . allopurinol (ZYLOPRIM) 100 MG tablet Take 1 tablet (100 mg total) by mouth daily.  . cloNIDine (CATAPRES) 0.3 MG tablet Take 0.3 mg by mouth 2 (two) times daily.  . colchicine 0.6 MG tablet 1.2 mg once, then 0.6 mg 1 hour after. Then 0.6 mg daily.  . furosemide  (LASIX) 20 MG tablet Take 1 tablet (20 mg total) by mouth daily.  . hydrochlorothiazide (HYDRODIURIL) 25 MG tablet TAKE 1 TABLET (25 MG TOTAL) BY MOUTH DAILY.  . metoprolol succinate (TOPROL-XL) 100 MG 24 hr tablet TAKE 1 TABLET DAILY TAKE   WITH OR IMMEDIATELY        FOLLOWING A MEAL  . simvastatin (ZOCOR) 20 MG tablet TAKE 1 TABLET AT BEDTIME  . SYNTHROID 100 MCG tablet TAKE 1 TABLET DAILY  . terazosin (HYTRIN) 2 MG capsule TAKE 1 CAPSULE AT BEDTIME  . XARELTO 20 MG TABS tablet TAKE 1 TABLET DAILY WITH   SUPPER  . potassium chloride (K-DUR) 10 MEQ tablet Take 1 tablet (10 mEq total) by mouth daily. Take with Lasix   No current facility-administered medications on file prior to visit.     Review of  Systems  Constitutional: Positive for activity change and fatigue. Negative for appetite change, chills, diaphoresis and fever.  HENT: Negative for congestion, hearing loss and sinus pressure.   Eyes: Negative for visual disturbance.  Respiratory: Positive for shortness of breath. Negative for apnea, cough, chest tightness and wheezing.   Cardiovascular: Negative for chest pain, palpitations and leg swelling.  Gastrointestinal: Negative for abdominal pain, anal bleeding, blood in stool, constipation, diarrhea, nausea and vomiting.  Endocrine: Negative for cold intolerance and polyuria.  Genitourinary: Negative for decreased urine volume, difficulty urinating, dysuria, frequency, hematuria, testicular pain and urgency.  Musculoskeletal: Positive for arthralgias, back pain and gait problem. Negative for neck pain.  Skin: Positive for wound (scalp laceration, healing). Negative for rash.  Allergic/Immunologic: Negative for environmental allergies.  Neurological: Positive for numbness (chronic feet). Negative for dizziness, weakness, light-headedness and headaches.  Hematological: Negative for adenopathy.  Psychiatric/Behavioral: Negative for behavioral problems, dysphoric mood and sleep  disturbance. The patient is not nervous/anxious.    Per HPI unless specifically indicated above     Objective:    BP 100/76   Pulse 76   Temp 97.6 F (36.4 C) (Oral)   Resp 16   Ht 5\' 8"  (1.727 m)   Wt 161 lb 3.2 oz (73.1 kg)   BMI 24.51 kg/m   Wt Readings from Last 3 Encounters:  12/29/16 161 lb 3.2 oz (73.1 kg)  12/20/16 160 lb (72.6 kg)  11/03/16 171 lb (77.6 kg)    Physical Exam  Constitutional: He is oriented to person, place, and time. He appears well-developed and well-nourished. No distress.  Well-appearing, comfortable, cooperative, has walker  HENT:  Head: Normocephalic and atraumatic.  Mouth/Throat: Oropharynx is clear and moist.  Eyes: Conjunctivae are normal. Right eye exhibits no discharge. Left eye exhibits no discharge.  Neck: Normal range of motion. Neck supple. No thyromegaly present.  Carotid bruits heard  Cardiovascular: Normal rate and intact distal pulses.   Murmur (1-3/0 systolic murmur) heard. Irregularly irregular  Pulmonary/Chest: Effort normal and breath sounds normal. No respiratory distress. He has no wheezes. He has no rales.  Pleurex catheter in place  Abdominal: Soft. Bowel sounds are normal. He exhibits no distension. There is no tenderness.  Musculoskeletal: Normal range of motion. He exhibits edema (Mild RUE lymphedema. No lower extremity edema).  Lymphadenopathy:    He has no cervical adenopathy.  Neurological: He is alert and oriented to person, place, and time.  Skin: Skin is warm and dry. No rash noted. He is not diaphoretic. No erythema.  Scalp top center with moderate sized superficial scalp laceration with no active bleeding but slight oozing onto non stick bandages. Has surrounding resolving yellow ecchymosis. Minimal hematoma improved. Non tender, no extending erythema.  Psychiatric: He has a normal mood and affect. His behavior is normal.  Well groomed, good eye contact, normal speech and thoughts  Nursing note and vitals  reviewed.  Results for orders placed or performed during the hospital encounter of 11/03/16  Potassium  Result Value Ref Range   Potassium 4.4 3.5 - 5.1 mmol/L  Protime-INR  Result Value Ref Range   Prothrombin Time 17.9 (H) 11.4 - 15.2 seconds   INR 1.46   APTT  Result Value Ref Range   aPTT 34 24 - 36 seconds      Assessment & Plan:   Problem List Items Addressed This Visit    Squamous cell carcinoma of skin of right arm, including shoulder    Stable, without recurrence S/p sequela of  lymph node dissection 29 nodes R axillary arm/upper ext, now on chronic compression RUE Followed by Dr Maryellen Pile      Recurrent pleural effusion on left    Complex issue with recurrences now for >5 months, s/p L pleurex catheter drain, x 3 weekly Still dyspnea. No significant edema Continues Lasix 20mg  QOD Followed by Gen Surgery / Cards May consider future inc drainage if needed x 4 weekly, if not improving      PAF (paroxysmal atrial fibrillation) (HCC)    Stable chronic problem Followed by Cardiology Dr Rockey Situ Continue current meds, including Metoprolol rate control      Essential hypertension - Primary    Controlled BP, concern may be slightly too low with issues of lightheaded and symptomatic, recent fall Complications with PAD, AS, PAF, CKD  Plan: 1. Discussed med review with patient/family - made recommendations for a few changes, but agree to hold for now until he discusses with Dr Rockey Situ, since I am just meeting him for first time today. I recommended may HALF HCTZ from 25 to 12.5mg  daily and may space Lasix to every 3rd day. Also concern that Terazosin may be contributing to lightheadedness or postural hypotension as well, but he needs some level of alpha blocker for BPH, limited options      Chronic diastolic CHF (congestive heart failure) (HCC)    Clinically euvolemic, with persistent chronic dyspnea, complicated by recurrent L pleural effusion, also worsening Aortic  Stenosis - Followed by Cardiology Dr Rockey Situ - Last ECHO 11/2016, with some concern worsening AS severity, LVEF 45-55%  Plan: 1. Discussed prior history, as new provider today no medication changes, advised he is due to see Dr Rockey Situ again in about 3-4 weeks, for 6 month follow-up, can review ECHO recommendations again. He understands he would be need TAVR surgery for repair, likely contributing to some of his symptoms, but from prior discussions with PCP and other considerations he is concerned due to high risk. 2. Continue Lasix 20mg  every other day for now - advised we may consider every 3rd day if concern too dry and low BP at times contributing to fall, may need to be only if swelling or worse dyspnea, will defer change to Cardiology, per patient 3. Follow-up PRN      Carotid arterial disease (HCC)    Stable, known PAD in high risk cardiac patient S/p R-CEA      Aortic valve stenosis, severe    Worsening AS severity on last ECHO, with persistent chronic dyspnea, complicated by recurrent L pleural effusion, dCHF - Followed by Cardiology Dr Rockey Situ - Last ECHO 11/2016, with some concern worsening AS severity, LVEF 45-55%  Plan: 1. Discussed prior history, as new provider today no medication changes, advised he is due to see Dr Rockey Situ again in about 3-4 weeks, for 6 month follow-up, can review ECHO recommendations again. He understands he would be need TAVR surgery for repair, likely contributing to some of his symptoms, but from prior discussions with PCP and other considerations he is concerned due to high risk. 2. Continue Lasix 20mg  every other day for now - advised we may consider every 3rd day if concern too dry and low BP at times contributing to fall, may need to be only if swelling or worse dyspnea, will defer change to Cardiology, per patient 3. Follow-up PRN       Other Visit Diagnoses    Encounter to establish care with new doctor       Needs flu shot  Relevant Orders    Flu vaccine HIGH DOSE PF (Completed)      No orders of the defined types were placed in this encounter.     Follow up plan: Return in about 3 months (around 03/30/2017) for HTN, CHF Swelling, BPH.  Nobie Putnam, [facility] 12/29/2016, 11:49 AM

## 2017-01-05 ENCOUNTER — Encounter: Payer: Self-pay | Admitting: Family Medicine

## 2017-01-06 ENCOUNTER — Telehealth: Payer: Self-pay | Admitting: Family Medicine

## 2017-01-06 NOTE — Telephone Encounter (Signed)
error 

## 2017-01-06 NOTE — Telephone Encounter (Signed)
Called Dr [name] at ([facility]), spoke with him approx 4:45pm today 9/18 to review this patient's case regarding his pleurex catheter. He is aware of patient and gave specific recommendations for further pleurex management. He states it will stay as long as functioning well, no duration estimate. Goal is to drain approx 500cc per time, however many times it is required a week, normally few times a week or every 1-2 weeks, and patient may use the included tegaderm with the kit or may use sterile gauze and medical tape to secure it, change dressing every other day. He is no longer planning to follow this patient going forward, and myself and Dr Rockey Situ Cardiology would manage his drain. If further questions he could be contacted for further advice by myself or by the patient.  Called Angel back with [facility], reviewed the above plan for pleurex protocol. She stated that [facility] will end after his scalp wound is healed. And they cannot continue to manage the drain, it will be turned over to the family to change dressings and drain it. Currently they draining it every other day with dressing change, approx 500 to 1000cc, so this is increased amount compared to previously expected. He is bothered by the tegaderm dressing, and they will try the sterile gauze with tape option, they have information they need to re-order supplies, and continue on this plan for now. Follow-up with me as planned every 3 months or sooner if need. Glenard Haring will contact Dr Genevive Bi back later this week to clarify amount draining and if any other changes should be made.  I responded back to MyChart message to patient's daughter, caregiver Debby and provided her more information on the above plan.  Nobie Putnam, DO [facility] 01/06/2017, 5:20 PM

## 2017-01-06 NOTE — Telephone Encounter (Signed)
Angel with [facility] [place] has question sabout the plan of care for pt's pleur drain [phone]

## 2017-01-07 NOTE — Telephone Encounter (Signed)
No longer our pt °

## 2017-01-11 ENCOUNTER — Encounter: Payer: Self-pay | Admitting: Family Medicine

## 2017-01-25 ENCOUNTER — Other Ambulatory Visit: Payer: Self-pay | Admitting: Family Medicine

## 2017-01-26 ENCOUNTER — Telehealth: Payer: Self-pay | Admitting: General Practice

## 2017-01-26 NOTE — Telephone Encounter (Signed)
Spoke with Jason Wade at this time. She states that Jason Wade will be ending next week and they have been draining 900-937ml three times weekly. He has also had increased Shortness of Breath and Weakness. He continues with Physical Therapy and from her understanding, as long as he is receiving therapy, he is eligible for nursing care.  I spoke with Jason Wade and Jason Wade from [facility]. They are getting in touch with the Insurance to re-certify him for more services.   Call returned to Ms. Jason Wade at this time and explained above information. She is very happy with this information. I explained that I will update her as I have more information.

## 2017-01-26 NOTE — Telephone Encounter (Signed)
Patient's daughter is calling asking if you would give her a call she would like to discuss getting something lined up with home health care and needs some advice. Jason Wade is In a meeting at 9:00 and 9:30 if you would like to call her before or after. Please call patient's daughter and advice.

## 2017-01-27 ENCOUNTER — Encounter: Payer: Self-pay | Admitting: *Deleted

## 2017-01-27 ENCOUNTER — Telehealth: Payer: Self-pay

## 2017-01-27 ENCOUNTER — Emergency Department: Payer: Medicare Other

## 2017-01-27 ENCOUNTER — Emergency Department
Admission: EM | Admit: 2017-01-27 | Discharge: 2017-01-27 | Disposition: A | Discharge status: Home / Self Care | Payer: Medicare Other | Attending: Emergency Medicine | Admitting: Emergency Medicine

## 2017-01-27 DIAGNOSIS — J90 Pleural effusion, not elsewhere classified: Secondary | ICD-10-CM | POA: Insufficient documentation

## 2017-01-27 DIAGNOSIS — I5032 Chronic diastolic (congestive) heart failure: Secondary | ICD-10-CM | POA: Diagnosis not present

## 2017-01-27 DIAGNOSIS — Z79899 Other long term (current) drug therapy: Secondary | ICD-10-CM | POA: Insufficient documentation

## 2017-01-27 DIAGNOSIS — Z85828 Personal history of other malignant neoplasm of skin: Secondary | ICD-10-CM | POA: Insufficient documentation

## 2017-01-27 DIAGNOSIS — I2510 Atherosclerotic heart disease of native coronary artery without angina pectoris: Secondary | ICD-10-CM | POA: Diagnosis not present

## 2017-01-27 DIAGNOSIS — N182 Chronic kidney disease, stage 2 (mild): Secondary | ICD-10-CM | POA: Insufficient documentation

## 2017-01-27 DIAGNOSIS — R140 Abdominal distension (gaseous): Secondary | ICD-10-CM | POA: Diagnosis not present

## 2017-01-27 DIAGNOSIS — T85698A Other mechanical complication of other specified internal prosthetic devices, implants and grafts, initial encounter: Secondary | ICD-10-CM | POA: Diagnosis present

## 2017-01-27 DIAGNOSIS — E039 Hypothyroidism, unspecified: Secondary | ICD-10-CM | POA: Diagnosis not present

## 2017-01-27 DIAGNOSIS — Y829 Unspecified medical devices associated with adverse incidents: Secondary | ICD-10-CM | POA: Diagnosis not present

## 2017-01-27 DIAGNOSIS — I130 Hypertensive heart and chronic kidney disease with heart failure and stage 1 through stage 4 chronic kidney disease, or unspecified chronic kidney disease: Secondary | ICD-10-CM | POA: Insufficient documentation

## 2017-01-27 DIAGNOSIS — N179 Acute kidney failure, unspecified: Secondary | ICD-10-CM | POA: Insufficient documentation

## 2017-01-27 LAB — BASIC METABOLIC PANEL
Anion gap: 14 (ref 5–15)
BUN: 72 mg/dL — AB (ref 6–20)
CHLORIDE: 90 mmol/L — AB (ref 101–111)
CO2: 26 mmol/L (ref 22–32)
Calcium: 8.8 mg/dL — ABNORMAL LOW (ref 8.9–10.3)
Creatinine, Ser: 2.02 mg/dL — ABNORMAL HIGH (ref 0.61–1.24)
GFR calc Af Amer: 32 mL/min — ABNORMAL LOW (ref >60)
GFR calc non Af Amer: 27 mL/min — ABNORMAL LOW (ref >60)
GLUCOSE: 116 mg/dL — AB (ref 65–99)
Potassium: 3.8 mmol/L (ref 3.5–5.1)
Sodium: 130 mmol/L — ABNORMAL LOW (ref 135–145)

## 2017-01-27 LAB — CBC
HEMATOCRIT: 36.2 % — AB (ref 40.0–52.0)
Hemoglobin: 12 g/dL — ABNORMAL LOW (ref 13.0–18.0)
MCH: 28 pg (ref 26.0–34.0)
MCHC: 33.2 g/dL (ref 32.0–36.0)
MCV: 84.4 fL (ref 80.0–100.0)
Platelets: 229 10*3/uL (ref 150–440)
RBC: 4.29 MIL/uL — ABNORMAL LOW (ref 4.40–5.90)
RDW: 18.5 % — AB (ref 11.5–14.5)
WBC: 11 10*3/uL — AB (ref 3.8–10.6)

## 2017-01-27 NOTE — ED Provider Notes (Signed)
[facility] Emergency Department Provider Note  ____________________________________________   First MD Initiated Contact with Patient 01/27/17 1505     (approximate)  I have reviewed the triage vital signs and the nursing notes.   HISTORY  Chief Complaint pleurax catheter blocked   HPI Jason Wade is a 83 y.o. male with a history of recurrent left-sided pleural effusion status post Pleurx catheter this July was presenting to the emergency department today with a clogged catheter. His family reports that he has the Pleurx catheter drained about 3 times a week and about 950 cc of fluid is able to be drained each time. However, today thick, clotted and slightly bloody fluid was only able to be drained and only a few cc before the tubing seemed to clogged. The patient was then sent in by home health nursing for evaluation. The patient says that he has had worsening shortness of breath that he normally gets when he needs drainage of his chest cavity.   Past Medical History:  Diagnosis Date  . BPH (benign prostatic hyperplasia)   . Carotid arterial disease ([place])    a. 2015: s/p R CEA.  . Chronic diastolic CHF (congestive heart failure) ([place])    a. echo 11/2015: EF 55-60%, possible HK of inf wall, severe AS mean gradient 29, peak gradient 45, valve area 0.41, mild to mod MR, LA dilated @ 50 mild RA dilatation, left pleural effusion  . Chronic low back pain   . CKD (chronic kidney disease), stage II   . Dyspnea   . Essential hypertension   . Falls   . Gout   . Great toe pain, right 08/30/2016  . HLD (hyperlipidemia)   . Hypothyroidism   . Lower extremity neuropathy   . Neuropathy   . PAF (paroxysmal atrial fibrillation) ([place])    a. 11/2010 Echo: EF >55%, diast dysfxn, mild conc lvh.  . Severe aortic stenosis    a. echo 11/2015 w/ mean gradient 29 mmHg, peak gradient 45 mmHg, valve area 0.41 cm^2  . Squamous cell carcinoma of skin    a. x3 s/p local  excision followed by more extensive surgery due to lymphadenopathy on the right.    Patient Active Problem List   Diagnosis Date Noted  . Recurrent pleural effusion on left 11/03/2016  . Great toe pain, right 08/30/2016  . Carotid arterial disease ([place])   . Chronic diastolic CHF (congestive heart failure) ([place])   . CKD (chronic kidney disease), stage II   . Essential hypertension   . Lower extremity neuropathy   . PAF (paroxysmal atrial fibrillation) ([place])   . Aortic valve stenosis, severe   . BPH (benign prostatic hyperplasia) 06/05/2015  . Hypothyroidism 06/05/2015  . Hyperlipidemia 05/05/2014  . Squamous cell carcinoma of skin of right arm, including shoulder 04/22/2013    Past Surgical History:  Procedure Laterality Date  . CARDIAC CATHETERIZATION  04-08-2005  . CAROTID ENDARTERECTOMY    . CHEST TUBE INSERTION Left 11/03/2016   Procedure: CHEST TUBE INSERTION;  Surgeon: [name], MD;  Location: [facility] ORS;  Service: General;  Laterality: Left;  . KNEE SURGERY  02-09-2007   replacement  . LAMINECTOMY AND MICRODISCECTOMY LUMBAR SPINE      Bilateral  at L4-5 followed by L4-5 right microdiskectomy.  Marland Kitchen LYMPH NODE DISSECTION      Prior to Admission medications   Medication Sig Start Date End Date Taking? Authorizing Provider  allopurinol (ZYLOPRIM) 100 MG tablet Take 1 tablet (100 mg  total) by mouth daily. 09/16/16   Coral Spikes, DO  cloNIDine (CATAPRES) 0.3 MG tablet Take 0.3 mg by mouth 2 (two) times daily.    [provider]  colchicine 0.6 MG tablet 1.2 mg once, then 0.6 mg 1 hour after. Then 0.6 mg daily. 09/16/16   Coral Spikes, DO  furosemide (LASIX) 20 MG tablet Take 1 tablet (20 mg total) by mouth daily. 10/02/16   Coral Spikes, DO  hydrochlorothiazide (HYDRODIURIL) 25 MG tablet TAKE 1 TABLET (25 MG TOTAL) BY MOUTH DAILY. 05/27/16   Minna Merritts, MD  metoprolol succinate (TOPROL-XL) 100 MG 24 hr tablet TAKE 1 TABLET DAILY TAKE   WITH OR IMMEDIATELY         FOLLOWING A MEAL 07/28/16   Gollan, Kathlene November, MD  potassium chloride (K-DUR) 10 MEQ tablet Take 1 tablet (10 mEq total) by mouth daily. Take with Lasix 08/08/16 11/06/16  Minna Merritts, MD  simvastatin (ZOCOR) 20 MG tablet TAKE 1 TABLET AT BEDTIME 04/29/16   Cook, [place], DO  SYNTHROID 100 MCG tablet TAKE 1 TABLET DAILY 11/10/16   Thersa Salt G, DO  terazosin (HYTRIN) 2 MG capsule TAKE 1 CAPSULE AT BEDTIME 12/10/16   Cook, [place] G, DO  XARELTO 20 MG TABS tablet TAKE 1 TABLET DAILY WITH   SUPPER 07/28/16   Minna Merritts, MD    Allergies Patient has no known allergies.  Family History  Problem Relation Age of Onset  . Hypertension Mother   . Stroke Mother     Social History Social History  Substance Use Topics  . Smoking status: Never Smoker  . Smokeless tobacco: Never Used  . Alcohol use No    Review of Systems  Constitutional: No fever/chills Eyes: No visual changes. ENT: No sore throat. Cardiovascular: Denies chest pain. Respiratory: as above Gastrointestinal: No abdominal pain.  No nausea, no vomiting.  No diarrhea.  No constipation. Genitourinary: Negative for dysuria. Musculoskeletal: Negative for back pain. Skin: Negative for rash. Neurological: Negative for headaches, focal weakness or numbness.   ____________________________________________   PHYSICAL EXAM:  VITAL SIGNS: ED Triage Vitals  Enc Vitals Group     BP 01/27/17 1420 (!) 123/93     Pulse Rate 01/27/17 1420 64     Resp 01/27/17 1420 17     Temp 01/27/17 1420 98.8 F (37.1 C)     Temp Source 01/27/17 1420 Oral     SpO2 01/27/17 1420 95 %     Weight 01/27/17 1421 160 lb (72.6 kg)     Height 01/27/17 1421 5\' 5"  (1.651 m)     Head Circumference --      Peak Flow --      Pain Score --      Pain Loc --      Pain Edu? --      Excl. in [place]? --     Constitutional: Alert and oriented. Well appearing and in no acute distress. Eyes: Conjunctivae are normal.  Head: Atraumatic. Nose: No  congestion/rhinnorhea. Mouth/Throat: Mucous membranes are moist.  Neck: No stridor.   Cardiovascular: Normal rate, regular rhythm. Grossly normal heart sounds. Pleurx bandages are CDI. No tenderness over the Pleurx catheter insertion site. No surrounding erythema, induration or pus. Respiratory: Normal respiratory effort.  No retractions. left lower lung field with absent lung sounds. Gastrointestinal: Soft and nontender. lower abdomen is distended but soft and nontender. Not tense. Musculoskeletal: No lower extremity tenderness nor edema.  No joint effusions.  Neurologic:  Normal speech and language. No gross focal neurologic deficits are appreciated. Skin:  Skin is warm, dry and intact. No rash noted. Psychiatric: Mood and affect are normal. Speech and behavior are normal.  ____________________________________________   LABS (all labs ordered are listed, but only abnormal results are displayed)  Labs Reviewed  BASIC METABOLIC PANEL - Abnormal; Notable for the following:       Result Value   Sodium 130 (*)    Chloride 90 (*)    Glucose, Bld 116 (*)    BUN 72 (*)    Creatinine, Ser 2.02 (*)    Calcium 8.8 (*)    GFR calc non Af Amer 27 (*)    GFR calc Af Amer 32 (*)    All other components within normal limits  CBC - Abnormal; Notable for the following:    WBC 11.0 (*)    RBC 4.29 (*)    Hemoglobin 12.0 (*)    HCT 36.2 (*)    RDW 18.5 (*)    All other components within normal limits   ____________________________________________  EKG   ____________________________________________  RADIOLOGY  intermittent increase in left-sided pleural effusion. ____________________________________________   PROCEDURES  Procedure(s) performed:   Procedures  Critical Care performed:   ____________________________________________   INITIAL IMPRESSION / ASSESSMENT AND PLAN / ED COURSE  Pertinent labs & imaging results that were available during my care of the patient were  reviewed by me and considered in my medical decision making (see chart for details).  DDX: Clogged Pleurx catheter, recurrent effusion, shortness of breath  As part of my medical decision making, I reviewed the following data within the [place] reviewed creatinine and BUN which are both increased today, A consult was requested and obtained from this/these consultant(s) Cardiothoracic surgery as well as cardiology and Notes from prior ED visits  ----------------------------------------- 4:24 PM on 01/27/2017 -----------------------------------------  I discussed the case with Dr. Faith Rogue of cardiothoracic surgery who is currently not operating room but will be coming down to evaluate the patient. I also discussed the patient's increasing creatinine and BUN with cardiology, Dr. and, who recommends discontinuing the patient's potassium as well as Lasix over the next 2 days and from the follow-up for repeat lab work at the end of the week.    ----------------------------------------- 5:17 PM on 01/27/2017 -----------------------------------------  Dr. Faith Rogue was able to flush the patient's Pleurx catheter tubing and then drained about 500 cc of serosanguineous fluid. The patient says that he is able to breathe at his baseline after the fluid has been drained. Patient says that his abdominal distention is been ongoing for weeks and that he does not have any nausea, vomiting or pain to the abdomen. Says that he had a normal bowel movement this morning is passing gas. Unclear etiology. However, we discussed that this could be worked up as an outpatient. He is not clinically obstructed. We also discussed stopping his Lasix as well as potassium over the next 2 days and having his blood work checked by the end of the week with cardiology because of his lower kidney function which is following a possible prerenal pattern. The patient says that he drinks plenty of fluids. He will not be  bolused with IV fluids at this time because because he says he is taking by mouth and I believe that the Lasix holding will likely improve his kidney function. The patient as well as family understands plan one to comply. Patient and family say  that Bili of drain a Pleurx catheter tomorrow as instructed by Dr. Faith Rogue. Will be discharged at this time.   ____________________________________________   FINAL CLINICAL IMPRESSION(S) / ED DIAGNOSES  Pleurx catheter malfunction. Pleural effusion. Acute kidney injury.    NEW MEDICATIONS STARTED DURING THIS VISIT:  New Prescriptions   No medications on file     Note:  This document was prepared using Dragon voice recognition software and may include unintentional dictation errors.     Orbie Pyo, MD 01/27/17 [phone]

## 2017-01-27 NOTE — ED Notes (Signed)
Dr. Genevive Bi, specialist at bedside to drain

## 2017-01-27 NOTE — Telephone Encounter (Signed)
Spoke with Cecilton @ [facility] and she is currently at the patients home. She stated they were going to discharge the patient today, however during the pleurx catheter drainage today there is a bloody discharge and a thick mucous material in the tubing. Glenard Haring has tried manipulating the tubing to expel the material but unsuccessful.   Patient was instructed to go to emergency room. Daughter called back and stated she was on her way to pick patient up and take him via her car.

## 2017-01-27 NOTE — ED Triage Notes (Signed)
Pt sent by home health for blocked left Pl eurax catheter, pt denies pain or any other problems, catheter did not drain much today, catheter is drained three times a week

## 2017-01-28 ENCOUNTER — Encounter: Payer: Self-pay | Admitting: *Deleted

## 2017-01-28 ENCOUNTER — Other Ambulatory Visit: Payer: Self-pay

## 2017-01-28 ENCOUNTER — Emergency Department: Payer: Medicare Other

## 2017-01-28 ENCOUNTER — Telehealth: Payer: Self-pay

## 2017-01-28 ENCOUNTER — Emergency Department
Admission: EM | Admit: 2017-01-28 | Discharge: 2017-01-28 | Disposition: A | Discharge status: Home / Self Care | Payer: Medicare Other | Attending: Emergency Medicine | Admitting: Emergency Medicine

## 2017-01-28 ENCOUNTER — Telehealth: Payer: Self-pay | Admitting: *Deleted

## 2017-01-28 DIAGNOSIS — Y9389 Activity, other specified: Secondary | ICD-10-CM | POA: Insufficient documentation

## 2017-01-28 DIAGNOSIS — S59912A Unspecified injury of left forearm, initial encounter: Secondary | ICD-10-CM | POA: Diagnosis present

## 2017-01-28 DIAGNOSIS — W01198A Fall on same level from slipping, tripping and stumbling with subsequent striking against other object, initial encounter: Secondary | ICD-10-CM | POA: Insufficient documentation

## 2017-01-28 DIAGNOSIS — I2510 Atherosclerotic heart disease of native coronary artery without angina pectoris: Secondary | ICD-10-CM | POA: Insufficient documentation

## 2017-01-28 DIAGNOSIS — I130 Hypertensive heart and chronic kidney disease with heart failure and stage 1 through stage 4 chronic kidney disease, or unspecified chronic kidney disease: Secondary | ICD-10-CM | POA: Diagnosis not present

## 2017-01-28 DIAGNOSIS — I5032 Chronic diastolic (congestive) heart failure: Secondary | ICD-10-CM | POA: Insufficient documentation

## 2017-01-28 DIAGNOSIS — Z79899 Other long term (current) drug therapy: Secondary | ICD-10-CM | POA: Diagnosis not present

## 2017-01-28 DIAGNOSIS — E039 Hypothyroidism, unspecified: Secondary | ICD-10-CM | POA: Diagnosis not present

## 2017-01-28 DIAGNOSIS — S51812A Laceration without foreign body of left forearm, initial encounter: Secondary | ICD-10-CM | POA: Insufficient documentation

## 2017-01-28 DIAGNOSIS — Y92002 Bathroom of unspecified non-institutional (private) residence single-family (private) house as the place of occurrence of the external cause: Secondary | ICD-10-CM | POA: Insufficient documentation

## 2017-01-28 DIAGNOSIS — Z85828 Personal history of other malignant neoplasm of skin: Secondary | ICD-10-CM | POA: Insufficient documentation

## 2017-01-28 DIAGNOSIS — Z7901 Long term (current) use of anticoagulants: Secondary | ICD-10-CM | POA: Diagnosis not present

## 2017-01-28 DIAGNOSIS — Y999 Unspecified external cause status: Secondary | ICD-10-CM | POA: Insufficient documentation

## 2017-01-28 DIAGNOSIS — N182 Chronic kidney disease, stage 2 (mild): Secondary | ICD-10-CM | POA: Diagnosis not present

## 2017-01-28 MED ORDER — BACITRACIN ZINC 500 UNIT/GM EX OINT: TOPICAL_OINTMENT | CUTANEOUS | 0 refills | Status: DC

## 2017-01-28 MED ORDER — BACITRACIN ZINC 500 UNIT/GM EX OINT
TOPICAL_OINTMENT | Freq: Once | CUTANEOUS | Status: AC
  Administered 2017-01-28: 1 via TOPICAL

## 2017-01-28 MED ORDER — BACITRACIN ZINC 500 UNIT/GM EX OINT
TOPICAL_OINTMENT | CUTANEOUS | Status: AC
  Administered 2017-01-28: 1 via TOPICAL
  Filled 2017-01-28: qty 1.8

## 2017-01-28 NOTE — Telephone Encounter (Signed)
Patients daughter reports that they have appointment with patients PCP on Friday for labs to be done. They do plan to cancel though if rain and winds become a problem. Advised her to please call back to make sure these labs are done when it is safe to travel. She verbalized understanding and had no further questions at this time.

## 2017-01-28 NOTE — Telephone Encounter (Signed)
Spoke with patient's daughter at this time. I explained that I have spoken with Dr. Genevive Bi and she is to follow-up with PCP and Cardiology. He will only need follow-up with our office if he continues to have difficulty with his Pleurx catheter.   I also talked with her about the re-certification for home health care has been sent to Dr. Rockey Situ and Dr. Parks Ranger. She should expect a call from [facility] letting her know the outcome of recertification.  She verbalizes understanding of all information addressed above.

## 2017-01-28 NOTE — ED Notes (Signed)
X-ray at bedside

## 2017-01-28 NOTE — Telephone Encounter (Signed)
Spoke with patient and he does not want to come in on Friday due to the bad weather that is coming in. He then requested that I call and speak with his daughter regarding this information. He was appreciative for the call and had no further questions at this time.

## 2017-01-28 NOTE — ED Provider Notes (Addendum)
[facility] Emergency Department Provider Note  ____________________________________________   First MD Initiated Contact with Patient 01/28/17 1928     (approximate)  I have reviewed the triage vital signs and the nursing notes.   HISTORY  Chief Complaint Fall   HPI Jason Wade is a 83 y.o. male with a history of recurrent left-sided pleural effusion was presenting to the emergency department after a fall. He says that he was trying to put an eyedrops when he fell backwards, hitting his hand against his bathtub. He denies hitting his head or losing consciousness. However, he did sustain a skin tear to his left forearm. The patient a handkerchief to stop the bleeding. EMS was called. Once EMS arrived at the cut some of his skin off to remove the handkerchief. Nonadhesive dressings were placed and the patient was transported to the hospital. The patient denies feeling like he was going to pass out. Denies any chest pain or shortness of breath. I saw the patient yesterday for malfunction of his Pleurx catheter. He says that his breathing is much improved today. He has appointment with his primary care doctor this coming Friday.patient reports his tetanus shopping within the last 10 years.   Past Medical History:  Diagnosis Date  . BPH (benign prostatic hyperplasia)   . Carotid arterial disease ([place])    a. 2015: s/p R CEA.  . Chronic diastolic CHF (congestive heart failure) ([place])    a. echo 11/2015: EF 55-60%, possible HK of inf wall, severe AS mean gradient 29, peak gradient 45, valve area 0.41, mild to mod MR, LA dilated @ 50 mild RA dilatation, left pleural effusion  . Chronic low back pain   . CKD (chronic kidney disease), stage II   . Dyspnea   . Essential hypertension   . Falls   . Gout   . Great toe pain, right 08/30/2016  . HLD (hyperlipidemia)   . Hypothyroidism   . Lower extremity neuropathy   . Neuropathy   . PAF (paroxysmal atrial  fibrillation) ([place])    a. 11/2010 Echo: EF >55%, diast dysfxn, mild conc lvh.  . Severe aortic stenosis    a. echo 11/2015 w/ mean gradient 29 mmHg, peak gradient 45 mmHg, valve area 0.41 cm^2  . Squamous cell carcinoma of skin    a. x3 s/p local excision followed by more extensive surgery due to lymphadenopathy on the right.    Patient Active Problem List   Diagnosis Date Noted  . Recurrent pleural effusion on left 11/03/2016  . Great toe pain, right 08/30/2016  . Carotid arterial disease ([place])   . Chronic diastolic CHF (congestive heart failure) ([place])   . CKD (chronic kidney disease), stage II   . Essential hypertension   . Lower extremity neuropathy   . PAF (paroxysmal atrial fibrillation) ([place])   . Aortic valve stenosis, severe   . BPH (benign prostatic hyperplasia) 06/05/2015  . Hypothyroidism 06/05/2015  . Hyperlipidemia 05/05/2014  . Squamous cell carcinoma of skin of right arm, including shoulder 04/22/2013    Past Surgical History:  Procedure Laterality Date  . CARDIAC CATHETERIZATION  04-08-2005  . CAROTID ENDARTERECTOMY    . CHEST TUBE INSERTION Left 11/03/2016   Procedure: CHEST TUBE INSERTION;  Surgeon: [name], MD;  Location: [facility] ORS;  Service: General;  Laterality: Left;  . KNEE SURGERY  02-09-2007   replacement  . LAMINECTOMY AND MICRODISCECTOMY LUMBAR SPINE      Bilateral  at L4-5 followed by L4-5  right microdiskectomy.  Marland Kitchen LYMPH NODE DISSECTION      Prior to Admission medications   Medication Sig Start Date End Date Taking? Authorizing Provider  allopurinol (ZYLOPRIM) 100 MG tablet Take 1 tablet (100 mg total) by mouth daily. 09/16/16   Coral Spikes, DO  cloNIDine (CATAPRES) 0.3 MG tablet Take 0.3 mg by mouth 2 (two) times daily.    [provider]  colchicine 0.6 MG tablet 1.2 mg once, then 0.6 mg 1 hour after. Then 0.6 mg daily. 09/16/16   Coral Spikes, DO  furosemide (LASIX) 20 MG tablet Take 1 tablet (20 mg total) by mouth daily. 10/02/16    Coral Spikes, DO  hydrochlorothiazide (HYDRODIURIL) 25 MG tablet TAKE 1 TABLET (25 MG TOTAL) BY MOUTH DAILY. 05/27/16   Minna Merritts, MD  metoprolol succinate (TOPROL-XL) 100 MG 24 hr tablet TAKE 1 TABLET DAILY TAKE   WITH OR IMMEDIATELY        FOLLOWING A MEAL 07/28/16   Gollan, Kathlene November, MD  potassium chloride (K-DUR) 10 MEQ tablet Take 1 tablet (10 mEq total) by mouth daily. Take with Lasix 08/08/16 11/06/16  Minna Merritts, MD  simvastatin (ZOCOR) 20 MG tablet TAKE 1 TABLET AT BEDTIME 04/29/16   Cook, [place], DO  SYNTHROID 100 MCG tablet TAKE 1 TABLET DAILY 11/10/16   Thersa Salt G, DO  terazosin (HYTRIN) 2 MG capsule TAKE 1 CAPSULE AT BEDTIME 12/10/16   Cook, [place] G, DO  XARELTO 20 MG TABS tablet TAKE 1 TABLET DAILY WITH   SUPPER 07/28/16   Minna Merritts, MD    Allergies Patient has no known allergies.  Family History  Problem Relation Age of Onset  . Hypertension Mother   . Stroke Mother     Social History Social History  Substance Use Topics  . Smoking status: Never Smoker  . Smokeless tobacco: Never Used  . Alcohol use No    Review of Systems  Constitutional: No fever/chills Eyes: No visual changes. ENT: No sore throat. Cardiovascular: Denies chest pain. Respiratory: Denies shortness of breath. Gastrointestinal: No abdominal pain.  No nausea, no vomiting.  No diarrhea.  No constipation. Genitourinary: Negative for dysuria. Musculoskeletal: Negative for back pain. Skin:as above Neurological: Negative for headaches, focal weakness or numbness.   ____________________________________________   PHYSICAL EXAM:  VITAL SIGNS: ED Triage Vitals  Enc Vitals Group     BP --      Pulse Rate 01/28/17 1924 89     Resp 01/28/17 1939 (!) 24     Temp 01/28/17 1924 98 F (36.7 C)     Temp Source 01/28/17 1924 Oral     SpO2 01/28/17 1923 95 %     Weight 01/28/17 1925 160 lb (72.6 kg)     Height 01/28/17 1925 5\' 5"  (1.651 m)     Head Circumference --      Peak Flow  --      Pain Score 01/28/17 1923 8     Pain Loc --      Pain Edu? --      Excl. in [place]? --     Constitutional: Alert and oriented. Well appearing and in no acute distress. Eyes: Conjunctivae are normal.  Head: Atraumatic. Nose: No congestion/rhinnorhea. Mouth/Throat: Mucous membranes are moist.  Neck: No stridor.   Cardiovascular: Normal rate, regular rhythm. Grossly normal heart sounds.  Good peripheral circulation with brisk capillary refill.   Respiratory: Normal respiratory effort.  No retractions. Lungs CTAB. Gastrointestinal:  Soft and nontender. No distention. No CVA tenderness. Musculoskeletal: No lower extremity tenderness nor edema.  No joint effusions. Neurologic:  Normal speech and language. No gross focal neurologic deficits are appreciated. Skin:  large skin tear to the left dorsal forearm and proximal hand. Bleeding is controlled. The skin tear is curvilinear and extends from the ulnar styloid onto the proximal dorsum of the hand. There is no exposed tendon or bone and the tear appears to be superficial. I'm unable to approximate the skin to the wound margins distally. There is about a 3 cm gap between the wound margin and the epidermis. There is no deformity but there is minimal tenderness to palpation over the proximal metacarpals. Patient is sensate and is 5 out of 5 strength distal to the skin tear. Psychiatric: Mood and affect are normal. Speech and behavior are normal.  ____________________________________________   LABS (all labs ordered are listed, but only abnormal results are displayed)  Labs Reviewed - No data to display ____________________________________________  EKG  ED ECG REPORT I, [place],  Youlanda Roys, the attending physician, personally viewed and interpreted this ECG.   Date: 01/28/2017  EKG Time: 1938  Rate: 86  Rhythm: normal sinus rhythmwith PVCs  Axis:normal  Intervals:none  ST&T Change: T-wave inversion in 1, aVL and V6. No ST segment  elevation or depression.no significant change from previous.  ____________________________________________  RADIOLOGY  no acute findings to the left wrist radiograph. ____________________________________________   PROCEDURES  Procedure(s) performed:   Procedures  Critical Care performed:   ____________________________________________   INITIAL IMPRESSION / ASSESSMENT AND PLAN / ED COURSE  Pertinent labs & imaging results that were available during my care of the patient were reviewed by me and considered in my medical decision making (see chart for details).  DX: Skin tear, cellulitis, hand fracture, wrist fracture  As part of my medical decision making, I reviewed the following data within the electronic MEDICAL RECORD NUMBER Notes from prior ED visits  ----------------------------------------- 8:12 PM on 01/28/2017 -----------------------------------------  Skin was approximated to the best of my ability. Bleeding has been controlled. The patient's skin appears very thin and fragile. I'm not confident that sutures would hold the skin in place nor do the wound margins approximated well. We will rinse the wound with saline. We will cover the wound with bacitracin and nonadhesive dressings and then gauze. I discussed bacitracin as well as nonadhesive dressings and wound changes twice a day with the family. The patient already has follow-up appointment scheduled this Friday with his primary care doctor.patient without headache. Denies any head trauma. No trauma to the head or neck.      ____________________________________________   FINAL CLINICAL IMPRESSION(S) / ED DIAGNOSES  skin tear.    NEW MEDICATIONS STARTED DURING THIS VISIT:  New Prescriptions   No medications on file     Note:  This document was prepared using Dragon voice recognition software and may include unintentional dictation errors.     Orbie Pyo, MD 01/28/17 2013    Orbie Pyo, MD 01/28/17 2017

## 2017-01-28 NOTE — ED Triage Notes (Signed)
Pt states he was putting in eye gtts ans got unsteady. He turned to catch himself and slipped and fell and torn skin on left arm. He is a&o x4 and a reasonable historian

## 2017-01-28 NOTE — Telephone Encounter (Signed)
-----   Message from Nelva Bush, MD sent at 01/27/2017  4:19 PM EDT ----- Regarding: AoCKI I was called by the ED due to a bump in Mr. Allemand' creatinine. He initially presented because his pleurx catheter is clogged. Creatinine is up to 2.0 from 1.6. I recommended that the ED have him hold his Lasix and potassium and that he have labs done on Friday in our office to reassess his renal function and potassium. Thanks.  Gerald Stabs

## 2017-01-29 ENCOUNTER — Telehealth: Payer: Self-pay

## 2017-01-29 ENCOUNTER — Ambulatory Visit
Admission: RE | Admit: 2017-01-29 | Discharge: 2017-01-29 | Disposition: A | Discharge status: Home / Self Care | Payer: Medicare Other | Source: Ambulatory Visit | Attending: Cardiothoracic Surgery | Admitting: Cardiothoracic Surgery

## 2017-01-29 ENCOUNTER — Encounter: Payer: Self-pay | Admitting: Cardiothoracic Surgery

## 2017-01-29 ENCOUNTER — Encounter: Payer: Self-pay | Admitting: Emergency Medicine

## 2017-01-29 ENCOUNTER — Other Ambulatory Visit: Payer: Self-pay

## 2017-01-29 ENCOUNTER — Ambulatory Visit (INDEPENDENT_AMBULATORY_CARE_PROVIDER_SITE_OTHER): Payer: Medicare Other | Admitting: Cardiothoracic Surgery

## 2017-01-29 ENCOUNTER — Telehealth: Payer: Self-pay | Admitting: Cardiovascular Disease

## 2017-01-29 ENCOUNTER — Other Ambulatory Visit
Admission: RE | Admit: 2017-01-29 | Discharge: 2017-01-29 | Disposition: A | Discharge status: Home / Self Care | Payer: Medicare Other | Source: Ambulatory Visit | Attending: Cardiothoracic Surgery | Admitting: Cardiothoracic Surgery

## 2017-01-29 ENCOUNTER — Emergency Department
Admission: EM | Admit: 2017-01-29 | Discharge: 2017-01-29 | Disposition: A | Discharge status: Home / Self Care | Payer: Medicare Other | Attending: Emergency Medicine | Admitting: Emergency Medicine

## 2017-01-29 VITALS — BP 121/69 | HR 80 | Temp 97.6°F | Resp 20 | Ht 68.0 in

## 2017-01-29 DIAGNOSIS — R7989 Other specified abnormal findings of blood chemistry: Secondary | ICD-10-CM | POA: Diagnosis not present

## 2017-01-29 DIAGNOSIS — J90 Pleural effusion, not elsewhere classified: Secondary | ICD-10-CM

## 2017-01-29 DIAGNOSIS — R918 Other nonspecific abnormal finding of lung field: Secondary | ICD-10-CM | POA: Insufficient documentation

## 2017-01-29 DIAGNOSIS — Z5321 Procedure and treatment not carried out due to patient leaving prior to being seen by health care provider: Secondary | ICD-10-CM | POA: Insufficient documentation

## 2017-01-29 LAB — BASIC METABOLIC PANEL
Anion gap: 13 (ref 5–15)
BUN: 79 mg/dL — ABNORMAL HIGH (ref 6–20)
CHLORIDE: 90 mmol/L — AB (ref 101–111)
CO2: 28 mmol/L (ref 22–32)
CREATININE: 2.1 mg/dL — AB (ref 0.61–1.24)
Calcium: 8.7 mg/dL — ABNORMAL LOW (ref 8.9–10.3)
GFR, EST AFRICAN AMERICAN: 30 mL/min — AB (ref >60)
GFR, EST NON AFRICAN AMERICAN: 26 mL/min — AB (ref >60)
Glucose, Bld: 124 mg/dL — ABNORMAL HIGH (ref 65–99)
POTASSIUM: 2.7 mmol/L — AB (ref 3.5–5.1)
SODIUM: 131 mmol/L — AB (ref 135–145)

## 2017-01-29 LAB — COMPREHENSIVE METABOLIC PANEL
ALBUMIN: 2.6 g/dL — AB (ref 3.5–5.0)
ALT: 18 U/L (ref 17–63)
AST: 44 U/L — AB (ref 15–41)
Alkaline Phosphatase: 92 U/L (ref 38–126)
Anion gap: 15 (ref 5–15)
BUN: 76 mg/dL — AB (ref 6–20)
CHLORIDE: 89 mmol/L — AB (ref 101–111)
CO2: 27 mmol/L (ref 22–32)
CREATININE: 2.01 mg/dL — AB (ref 0.61–1.24)
Calcium: 8.9 mg/dL (ref 8.9–10.3)
GFR calc Af Amer: 32 mL/min — ABNORMAL LOW (ref >60)
GFR, EST NON AFRICAN AMERICAN: 28 mL/min — AB (ref >60)
GLUCOSE: 126 mg/dL — AB (ref 65–99)
Potassium: 3 mmol/L — ABNORMAL LOW (ref 3.5–5.1)
SODIUM: 131 mmol/L — AB (ref 135–145)
Total Bilirubin: 1 mg/dL (ref 0.3–1.2)
Total Protein: 6.6 g/dL (ref 6.5–8.1)

## 2017-01-29 LAB — CBC
HCT: 36.6 % — ABNORMAL LOW (ref 40.0–52.0)
Hemoglobin: 11.8 g/dL — ABNORMAL LOW (ref 13.0–18.0)
MCH: 27.2 pg (ref 26.0–34.0)
MCHC: 32.3 g/dL (ref 32.0–36.0)
MCV: 84.1 fL (ref 80.0–100.0)
PLATELETS: 255 10*3/uL (ref 150–440)
RBC: 4.36 MIL/uL — AB (ref 4.40–5.90)
RDW: 18.7 % — ABNORMAL HIGH (ref 11.5–14.5)
WBC: 11.2 10*3/uL — AB (ref 3.8–10.6)

## 2017-01-29 NOTE — Telephone Encounter (Signed)
Angel from [facility] is calling regarding patient's pleurx catheter drain is clogged and she is unable to drain.  Spoke with Dr.Oaks and he will flush drain. Patient is added to his schedule 01/29/17 1:15 pm.   Patient notified of appointment.

## 2017-01-29 NOTE — Telephone Encounter (Signed)
No answer. Left message to call back.   

## 2017-01-29 NOTE — Progress Notes (Signed)
  Patient ID: Jason Wade, male   DOB: April 22, 1923, 83 y.o.   MRN: [id number]  HISTORY: This gentleman is a 83 year old patient who underwent a left Pleurx catheter insertion for recurrent pleural effusions. He was seen in the emergency department 2 days ago because the catheter could not be drained. I did flush the catheter and then was able to drain the catheter of about 500 cc. Again today they tried to drain the catheter were unsuccessful. I brought the patient into the clinic today to make an assessment.   Vitals:   01/29/17 1319  BP: 121/69  Pulse: 80  Resp: 20  Temp: 97.6 F (36.4 C)  SpO2: 95%     EXAM: I did examine his wounds. They're all clean dry and intact. There is no erythema. The catheter itself has a small amount of serous drainage in it.   ASSESSMENT: I did flush the catheter with 10 cc of normal saline. I was able to flush and irrigated nicely. However when we looked at up to the Pleurx catheter we could only get a small amount out.   PLAN:   I will try to get a chest x-ray today. We'll also repeat some blood work. He will follow-up with his primary care doctor tomorrow. I asked him to contact my office to get the results of the x-ray. It may be that the catheter is nonfunctional at this point and needs to be removed. I did explain this to the patient and to his family who were at his bedside.    [name], MD

## 2017-01-29 NOTE — Telephone Encounter (Signed)
Call placed to Dr.Oaks at this time regarding Chest xray today. Per Dr.Oaks his number was provided to the patient's son so he could discuss the chest xray and plan moving forward.   Placed call to patient's son and number provided and he will call Dr.Oaks.

## 2017-01-29 NOTE — Telephone Encounter (Signed)
PT daughter [name] calling Is trying to see if she can get an order for a Home Health nurse PT fell again yesterday  [place] comes out but they have cut back the amount of times and PT needs at least 3 times a week PT daughter just wants some information on what steps she needs to take to get this completed, she will also speak with PCP Please call to advise

## 2017-01-29 NOTE — ED Triage Notes (Addendum)
Pt sent for potassium 2.7. Denies any symptoms. No pain.  Denies CP/SHOB/cramping, etc. Already took 99mg  potassium gluconate

## 2017-01-29 NOTE — Addendum Note (Signed)
Addended by: Celene Kras on: 01/29/2017 02:38 PM   Modules accepted: Orders

## 2017-01-29 NOTE — Patient Instructions (Signed)
We need for you to have a chest xray done today and lab work. We will call you with the results.

## 2017-01-30 ENCOUNTER — Telehealth: Payer: Self-pay

## 2017-01-30 ENCOUNTER — Ambulatory Visit (INDEPENDENT_AMBULATORY_CARE_PROVIDER_SITE_OTHER): Payer: Medicare Other | Admitting: Family Medicine

## 2017-01-30 ENCOUNTER — Encounter: Payer: Self-pay | Admitting: Family Medicine

## 2017-01-30 VITALS — BP 157/92 | HR 83 | Temp 97.7°F | Ht 68.0 in | Wt 158.2 lb

## 2017-01-30 DIAGNOSIS — N184 Chronic kidney disease, stage 4 (severe): Secondary | ICD-10-CM

## 2017-01-30 DIAGNOSIS — S41112A Laceration without foreign body of left upper arm, initial encounter: Secondary | ICD-10-CM | POA: Diagnosis not present

## 2017-01-30 DIAGNOSIS — I5032 Chronic diastolic (congestive) heart failure: Secondary | ICD-10-CM

## 2017-01-30 DIAGNOSIS — I350 Nonrheumatic aortic (valve) stenosis: Secondary | ICD-10-CM

## 2017-01-30 DIAGNOSIS — E876 Hypokalemia: Secondary | ICD-10-CM

## 2017-01-30 DIAGNOSIS — N179 Acute kidney failure, unspecified: Secondary | ICD-10-CM | POA: Diagnosis not present

## 2017-01-30 DIAGNOSIS — J90 Pleural effusion, not elsewhere classified: Secondary | ICD-10-CM | POA: Diagnosis not present

## 2017-01-30 NOTE — Assessment & Plan Note (Addendum)
Likely related to inc fluid losses with pleural effusion, and also was on lasix before, concern with potential cardio-renal complication. - Given recent critical low 2.7, and several recent low K not >3.0 for several months  Plan: 1. Resume K supplement 10mEq x 2 tabs for 83mEq DAILY in AM 2. He will be resuming Lasix for trial over weekend since no draining through pleurex catheter EVERY OTHER DAY 20mg , and he should continue potassium though every day 3. Can re-check K / Chemistry within next 2 weeks

## 2017-01-30 NOTE — Assessment & Plan Note (Signed)
Recent complication with clogged pleurex catheter chest tube, x 2 occasions now seems non functional, recent CXR with stable from 10/9, but still pleural effusion / atx Complex issue with recurrences now for >6 months, s/p L pleurex catheter drain, x 3 DAILY No significant edema Followed by Gen Surgery / Cards  Plan 1. Agree with close follow-up Gen Surgery next week Monday to re-eval tube and may need removal or replace, or other alternative 2. Advised to resume Lasix today for 20mg  every OTHER day for now because non functioning chest tube, we may re-address this next week if tube is replaced and able to eliminate some of this excess 3rd spacing fluid 3. Restart K 49mEq daily

## 2017-01-30 NOTE — Telephone Encounter (Signed)
Spoke with patients daughter per release form and that they ordered him to have home health with hospice and would be coming out on Monday. She was very appreciative for the call back to check in on things and had no further questions at this time.

## 2017-01-30 NOTE — Patient Instructions (Addendum)
Thank you for coming to the clinic today.  1.  Recommend to take Lasix 20mg  every OTHER day - and take Potassium KDur 49mEq x 2 tablets for 20  ONCE daily (even on days when NOT taking Lasix)  May increase fluid intake be cautious of too much, especially if appears overloaded leg swelling short of breath. Can take an extra Lasix one pill as needed in emergency.  2. Stay tuned for update from Dr Genevive Bi on Monday, will determine how we treat the fluid / kidneys going forward if can get the tube to drain properly.  Ideally we can reduce lasix if we can drain better.  3.  [place] will be contacting you and arrange for next week likely Monday   [place], [place] Address: [address], [place], [place] [address] Open until [place] Phone: [phone]  [place],  [address] Open until [place] Phone: [phone]  Please schedule a Follow-up Appointment to: Return in about 4 weeks (around 02/27/2017) for pleural effusion, heart failure, CKD.  If you have any other questions or concerns, please feel free to call the clinic or send a message through [facility]. You may also schedule an earlier appointment if necessary.  Additionally, you may be receiving a survey about your experience at our clinic within a few days to 1 week by e-mail or mail. We value your feedback.  Nobie Putnam, DO [place]
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
## 2017-01-30 NOTE — Progress Notes (Signed)
Subjective:    Patient ID: Jason Wade, male    DOB: 10-09-1923, 83 y.o.   MRN: [id number]  Jason Wade is a 83 y.o. male presenting on 01/30/2017 for Hospitalization Follow-up ( critical Potassium level of 2.7, left Pleurx catheter insertion for recurrent pleural effusions. )  Patient provides some history, he is accompanied by his son, Harrie Jeans, and daughter, [name].  HPI   ED FOLLOW-UP VISIT  Hospital/Location: [facility] Date of ED Visit: 01/27/17, 01/28/17, and 01/29/17  Reason for Presenting to ED / Diagnosies: Recurrent Pleural Effusion (Left chest tube pleurex cath not draining), Fall with left forearm skin tear, Elevated Creatinine BUN, AoCKD  FOLLOW-UP - ED provider notes and record have been reviewed - Patient presents today about 1 day after most recent ED visit. Brief summary of recent course was reviewed. - First ED visit after dramatic increased drainage from pleurex cath chest and then ultimately had a thick clot blocking drain he had some dyspnea worsening, HH sent him to hospital, in hospital cardiology and gen surgery were both notified and Dr Genevive Bi was able to evaluate and unclog the chest tube. They had labs showing elevated Cr >2.0 and K low, and recommendations were made to HOLD Lasix and K supplement for 2 days and re-check. - In the interim patient had fall and went back to ED, see note, on 10/10, they had significant skin tear Left forearm, required irrigation and non adhesive dressing application, and recommended twice daily changes, he did not have other injuries and they ruled out fracture. He was discharged - Patient returned to office on 01/29/17 to see Dr Genevive Bi due to still clogged or poor draining pleurex catheter, in the office it was flushed but poor function with still limited drain, he had Chest X-ray and repeat labs as advised due to elevated BUN/Cr, results of the chest x-ray were relayed to family last night on 10/11 with pleurex still in place unchanged  from 10/9, and LLL pleural effusion and atx essentially unchanged without worsening, additionally the lab results were resulted yesterday afternoon with critical low K 2.7 and still elevated BUN 79 and Creatinine 2.1 worse than previous despite holding lasix. I was notified by lab service of critical result and called patient/daughter [name] to review this yesterday, see telephone note. I advised them to go back to Hospital ED due to concern Renal Failure that was worsening, and especially with limited functioning chest tube now. He went to ED last night, after unable to get home and get access to his K meds (they did go to [employer] and buy potassium gluconate and took that), in ED they waited several hours and had labs but decided to leave instead of being evaluated. - Today they report that patient still has soreness on L side from fall. He does not endorse any new symptoms of dyspnea chest pain. He seems to be unchanged over past few days and not having worsening breathing. No further drainage out of chest tube, they are waiting until Monday when scheduled with Dr Genevive Bi to re-evaluate tube may need it removed if non functioning. - They are requesting repeat dressing non adhesive for Left wrist and increased access to home health care or hospice to help with dressing changes and with general care in future - Concern with recent lab results, low K, elevated BUN and elevated Cr despite holding lasix and repeat values - He is voiding well, maybe reduced amount but he has BPH. Taking in slightly less water recently. -  He is currently still HOLDING Lasix 20mg  daily and K 24mEq - Admits recent dry cough - Admits feels overall a little fatigued - Denies dyspnea, chest pain or tightness, near syncope, headache, lightheadedness, confusion, leg swelling, productive cough, nausea vomiting  Health Maintenance: - UTD Flu Shot 12/29/16 - UTD Pneumonia and TDap vaccines.  Depression screen [facility] 2/9 06/16/2016 06/04/2015    Decreased Interest 0 0  Down, Depressed, Hopeless 0 0  PHQ - 2 Score 0 0    Social History  Substance Use Topics  . Smoking status: Never Smoker  . Smokeless tobacco: Never Used  . Alcohol use No    Review of Systems Per HPI unless specifically indicated above     Objective:    BP (!) 157/92 (BP Location: Right Arm, Patient Position: Sitting, Cuff Size: Normal)   Pulse 83   Temp 97.7 F (36.5 C) (Oral)   Ht 5\' 8"  (1.727 m)   Wt 158 lb 3.2 oz (71.8 kg)   SpO2 98%   BMI 24.05 kg/m   Wt Readings from Last 3 Encounters:  01/30/17 158 lb 3.2 oz (71.8 kg)  01/29/17 160 lb (72.6 kg)  01/28/17 160 lb (72.6 kg)    Physical Exam  Constitutional: He is oriented to person, place, and time. He appears well-developed and well-nourished. No distress.  Chronically ill but currently more tired but still fairly well appearing 83 year old elderly male, uncomfortable due to left hand/wrist, cooperative, has walker  HENT:  Head: Normocephalic and atraumatic.  Mouth/Throat: Oropharynx is clear and moist.  Mild dry mucus mem  Eyes: Conjunctivae are normal. Right eye exhibits no discharge. Left eye exhibits no discharge.  Neck: Normal range of motion. Neck supple. No thyromegaly present.  Carotid bruits heard  Cardiovascular: Normal rate and intact distal pulses.   Murmur (1-6/1 systolic murmur) heard. Irregularly irregular Slightly reduced heart sounds  Pulmonary/Chest: Effort normal. No respiratory distress. He has no wheezes. He has rales.  Pleurex catheter in place Upper lung fields with mostly preserved air movement, some reduced effort. Mid to lower lung fields LLL worse with reduced air movement and crackles with known chronic pleural effusion.  Musculoskeletal: Normal range of motion. He exhibits edema (Mild RUE lymphedema. No lower extremity edema - unchanged).  Lymphadenopathy:    He has no cervical adenopathy.  Neurological: He is alert and oriented to person, place, and  time.  Skin: Skin is warm and dry. No rash noted. He is not diaphoretic. No erythema.  Scalp top center with healing superficial scalp laceration with bandage.  Left dorsal wrist with significant skin tear, reviewed images from date of injury 3 days ago when initial dressing placed in ED. Loose skin is not fully approximating which is unchanged. Has some extensive dried blood and active oozing. Cut away gauze and removed non stick pads used saline irrigation wash and coated with triple antibiotic ointment to avoid sticking placed new non skin pads in place on top of wound and wrapped with a fabric. Mild tender, no sign of secondary infection.  Psychiatric: He has a normal mood and affect. His behavior is normal.  Well groomed, good eye contact, normal speech and thoughts  Nursing note and vitals reviewed.  Results for orders placed or performed during the hospital encounter of 01/29/17  CBC  Result Value Ref Range   WBC 11.2 (H) 3.8 - 10.6 K/uL   RBC 4.36 (L) 4.40 - 5.90 MIL/uL   Hemoglobin 11.8 (L) 13.0 - 18.0 g/dL  HCT 36.6 (L) 40.0 - 52.0 %   MCV 84.1 80.0 - 100.0 fL   MCH 27.2 26.0 - 34.0 pg   MCHC 32.3 32.0 - 36.0 g/dL   RDW 18.7 (H) 11.5 - 14.5 %   Platelets 255 150 - 440 K/uL  Comprehensive metabolic panel  Result Value Ref Range   Sodium 131 (L) 135 - 145 mmol/L   Potassium 3.0 (L) 3.5 - 5.1 mmol/L   Chloride 89 (L) 101 - 111 mmol/L   CO2 27 22 - 32 mmol/L   Glucose, Bld 126 (H) 65 - 99 mg/dL   BUN 76 (H) 6 - 20 mg/dL   Creatinine, Ser 2.01 (H) 0.61 - 1.24 mg/dL   Calcium 8.9 8.9 - 10.3 mg/dL   Total Protein 6.6 6.5 - 8.1 g/dL   Albumin 2.6 (L) 3.5 - 5.0 g/dL   AST 44 (H) 15 - 41 U/L   ALT 18 17 - 63 U/L   Alkaline Phosphatase 92 38 - 126 U/L   Total Bilirubin 1.0 0.3 - 1.2 mg/dL   GFR calc non Af Amer 28 (L) >60 mL/min   GFR calc Af Amer 32 (L) >60 mL/min   Anion gap 15 5 - 15      Assessment & Plan:   Problem List Items Addressed This Visit    Acute renal  failure superimposed on stage 4 chronic kidney disease (HCC)    Concern recent worsening Cr trend over past 3 months, previous baseline 1.4 to 1.6, now with worsening >2.0, concern combination pre-renal dehydrated with elevated BUN, likely from 3rd spacing fluid loss with pleural effusion, could be component of hypoperfusion of kidney and also on lasix. - No significant improvement in Cr off Lasix for 2 days - Concern cardio-renal with severe AS / CHF and now declining CKD  Plan: 1. Discussion on difficulty of managing both organ systems, and reviewed my current concern is if chest tube is not draining, he may risk volume overload, and reviewed that in very short-term for at least this weekend and maybe longer can take lasix 20mg  every OTHER day if he is able to inc void fluid, and may use it PRN if significant worsening with dyspnea or other symptoms. - Resume K Dur 53mEq take 2 tablets for 69mEq DAILY now, even on days when not taking Lasix d/t fluid loss 2. Recommend re-check labs within 2 weeks      Aortic valve stenosis, severe    Worsening AS severity on last ECHO, with persistent chronic dyspnea, complicated by recurrent L pleural effusion, dCHF - Followed by Cardiology Dr Rockey Situ - Last ECHO 11/2016, with some concern worsening AS severity, LVEF 45-55%  Plan: 1. For now resuming QOD lasix to assist with potential risk of volume overload, since limited drain from chest tube 2. Reviewed previous discussion of consider future imaging and work-up in [place] to see if candidate for valve repair, which is high risk and concerning depending on course 3. Follow-up PRN      Chronic diastolic CHF (congestive heart failure) (HCC)   CKD (chronic kidney disease), stage IV (HCC)   Hypokalemia    Likely related to inc fluid losses with pleural effusion, and also was on lasix before, concern with potential cardio-renal complication. - Given recent critical low 2.7, and several recent low K not  >3.0 for several months  Plan: 1. Resume K supplement 22mEq x 2 tabs for 47mEq DAILY in AM 2. He will be resuming Lasix for trial over  weekend since no draining through pleurex catheter EVERY OTHER DAY 20mg , and he should continue potassium though every day 3. Can re-check K / Chemistry within next 2 weeks      Recurrent pleural effusion on left - Primary    Recent complication with clogged pleurex catheter chest tube, x 2 occasions now seems non functional, recent CXR with stable from 10/9, but still pleural effusion / atx Complex issue with recurrences now for >6 months, s/p L pleurex catheter drain, x 3 DAILY No significant edema Followed by Gen Surgery / Cards  Plan 1. Agree with close follow-up Gen Surgery next week Monday to re-eval tube and may need removal or replace, or other alternative 2. Advised to resume Lasix today for 20mg  every OTHER day for now because non functioning chest tube, we may re-address this next week if tube is replaced and able to eliminate some of this excess 3rd spacing fluid 3. Restart K 91mEq daily       Other Visit Diagnoses    Skin tear of left upper arm without complication, initial encounter       L dorsal wrist, extensive, poor approximating skin, no secondary infection, see exam for details today, non adhesive dressing, follow-up with home health/hospic      #Hospice Discussion today on potential referral to hospice, everyone including patient is agreeable to having initial meeting with home hospice, soonest arranged now for Monday next week, especially with increased need for care at home and symptom management, high risk with recent decline and falls. Concern with complex multi-organ failure with severe AS / CHF / CKD with AKI worsening. - Pending further evaluation by [facility]/Hospice  No orders of the defined types were placed in this encounter.   Follow up plan: Return in about 4 weeks (around 02/27/2017) for pleural effusion, heart  failure, CKD.  A total of >40 minutes was spent face-to-face with this patient. Greater than 50% of this time was spent in counseling on his worsening chronic disease state with CHF, CKD, recurrent pleural effusions, hypokalemia and fluid balance managed with medications, pleurex tube and coordination of care with the patient involving reviewing ED hospital note, General Surgery Notes, and contacting [facility] for Hospice Evaluation, accomplished today, with arrangements made for next week.  Will route note to Cardiology and General Surgery for review.  Nobie Putnam, DO [facility] 01/30/2017, 1:32 PM

## 2017-01-30 NOTE — Assessment & Plan Note (Addendum)
Concern recent worsening Cr trend over past 3 months, previous baseline 1.4 to 1.6, now with worsening >2.0, concern combination pre-renal dehydrated with elevated BUN, likely from 3rd spacing fluid loss with pleural effusion, could be component of hypoperfusion of kidney and also on lasix. - No significant improvement in Cr off Lasix for 2 days - Concern cardio-renal with severe AS / CHF and now declining CKD  Plan: 1. Discussion on difficulty of managing both organ systems, and reviewed my current concern is if chest tube is not draining, he may risk volume overload, and reviewed that in very short-term for at least this weekend and maybe longer can take lasix 20mg  every OTHER day if he is able to inc void fluid, and may use it PRN if significant worsening with dyspnea or other symptoms. - Resume K Dur 31mEq take 2 tablets for 110mEq DAILY now, even on days when not taking Lasix d/t fluid loss 2. Recommend re-check labs within 2 weeks

## 2017-01-30 NOTE — Assessment & Plan Note (Signed)
Worsening AS severity on last ECHO, with persistent chronic dyspnea, complicated by recurrent L pleural effusion, dCHF - Followed by Cardiology Dr Rockey Situ - Last ECHO 11/2016, with some concern worsening AS severity, LVEF 45-55%  Plan: 1. For now resuming QOD lasix to assist with potential risk of volume overload, since limited drain from chest tube 2. Reviewed previous discussion of consider future imaging and work-up in [place] to see if candidate for valve repair, which is high risk and concerning depending on course 3. Follow-up PRN

## 2017-01-30 NOTE — Telephone Encounter (Signed)
Spoke with patients daughter per release form and she states that her father has been in the ED twice this week already. She is wanting order for continued home health to assist with his care. She reports that he has had recent falls and also pleurX drain has been clogged as well. She reports that her father will be seeing Dr. Parks Ranger today and will discuss the needs with him. Instructed her to check with him regarding home health orders for continued care and to let us know if we can assist with anything. She was very appreciative for the call and had no further questions at this time.

## 2017-02-02 ENCOUNTER — Ambulatory Visit (INDEPENDENT_AMBULATORY_CARE_PROVIDER_SITE_OTHER): Payer: Medicare Other | Admitting: Cardiothoracic Surgery

## 2017-02-02 ENCOUNTER — Other Ambulatory Visit: Payer: Self-pay | Admitting: Cardiothoracic Surgery

## 2017-02-02 ENCOUNTER — Encounter: Payer: Self-pay | Admitting: Family Medicine

## 2017-02-02 DIAGNOSIS — J90 Pleural effusion, not elsewhere classified: Secondary | ICD-10-CM

## 2017-02-02 DIAGNOSIS — I5020 Unspecified systolic (congestive) heart failure: Secondary | ICD-10-CM

## 2017-02-02 NOTE — Patient Instructions (Addendum)
We will follow up with you once we have determined the process of the TPA process.

## 2017-02-02 NOTE — Progress Notes (Signed)
  Patient ID: Jason Wade, male   DOB: December 01, 1923, 83 y.o.   MRN: [id number]  HISTORY: He returns today in follow-up. He's attempted to drain his Pleurx catheter without success. He comes in today for large-volume flushing.   There were no vitals filed for this visit.   EXAM:  the Pleurx catheter site looks fine. There is no drainage around it.  I flushed the catheter 3 times with 50 cc of saline and tried to drain the catheter. I could only get out what I put back.     ASSESSMENT: onfunctioning Pleurx catheter   PLAN:   I explained to the patient and his son and daughter that we could try intrapleural TPA. They are interested in trying that prior to removal the catheter will set him up to have that made. The understand that there is a risk of intrapleural bleeding from this. However there are no further opttions available.    [name], MD

## 2017-02-03 ENCOUNTER — Telehealth: Payer: Self-pay

## 2017-02-03 NOTE — Telephone Encounter (Signed)
I spoke with patient's daughter, [name] in regards to patient qualifying for Hospice /Palliative services for more help with patient at home. She states that she spoke with patient's PCP on Friday and the patient was referred to [facility]. The CNA came out this morning and the nurse will be seeing the patient at home. I encouraged patient's daughter to call back with any further questions or concerns that she has. She verbalizes understanding.

## 2017-02-03 NOTE — Telephone Encounter (Signed)
-----   Message from Minna Merritts, MD sent at 02/01/2017  1:06 PM EDT ----- Not sure is lasix is helping much, Looks prerenal now, BUN and CR climbing.  Problem is likely aortic valve stenosis In setting of arrhythmia, low EF Only fix maybe TAVR We did call and offer, but may be worth another call  Pam,  Would he consider referral to TAVR clinic for evaluation? Might help breathing and fluid. They can determine if he is a candidate. Typically would involve some procedures, transesophageal echo, cardiac catheterization needed, CT scan needed in preparation to fix his valve thx TGollan  ----- Message ----- From: [name], DO Sent: 01/30/2017   1:37 PM To: Minna Merritts, MD  FYI - Saw patient today in office after recent ED visits, I am concerned about amount of fluid he had draining before and now non functioning drain, with worsening AoCKD. I restarted lasix every OTHER day for short term to avoid overload, and resumed K due to significant low. Also, I have referred him to [facility] and we will be awaiting evaluation on Monday. Any other input is appreciated.

## 2017-02-03 NOTE — Telephone Encounter (Signed)
Hospice care is now coming in to see patient at home and patients daughter states that it is very difficult to get patient out of the home for appointments. She also reports worsening kidney function and is just not sure these procedures would be helpful at this time. She was appreciative for the call and instructed her to please let us know if she should have any further questions or concerns.

## 2017-02-06 ENCOUNTER — Ambulatory Visit
Admission: RE | Admit: 2017-02-06 | Discharge: 2017-02-06 | Disposition: A | Discharge status: Home / Self Care | Source: Ambulatory Visit | Attending: Cardiothoracic Surgery | Admitting: Cardiothoracic Surgery

## 2017-02-06 DIAGNOSIS — I700 Atherosclerosis of aorta: Secondary | ICD-10-CM | POA: Insufficient documentation

## 2017-02-06 DIAGNOSIS — J90 Pleural effusion, not elsewhere classified: Secondary | ICD-10-CM | POA: Insufficient documentation

## 2017-02-06 DIAGNOSIS — I5020 Unspecified systolic (congestive) heart failure: Secondary | ICD-10-CM

## 2017-02-06 DIAGNOSIS — T82898A Other specified complication of vascular prosthetic devices, implants and grafts, initial encounter: Secondary | ICD-10-CM | POA: Insufficient documentation

## 2017-02-06 DIAGNOSIS — Z9889 Other specified postprocedural states: Secondary | ICD-10-CM | POA: Insufficient documentation

## 2017-02-06 DIAGNOSIS — J9811 Atelectasis: Secondary | ICD-10-CM | POA: Insufficient documentation

## 2017-02-06 MED ORDER — SODIUM CHLORIDE 0.9 % IJ SOLN
10.0000 mg | Freq: Once | INTRAMUSCULAR | Status: AC
  Administered 2017-02-06: 10 mg via INTRAPLEURAL
  Filled 2017-02-06: qty 10

## 2017-02-06 MED ORDER — CHLORHEXIDINE GLUCONATE 4 % EX LIQD: 1.0000 "application " | Freq: Once | CUTANEOUS | Status: DC

## 2017-02-06 NOTE — Progress Notes (Signed)
Dr. Genevive Bi here at bedside & connected Pleurix catheter to suction bottle..Immediately draining clear pinkish-colored fluid. 500 ml drained out quickly. 2nd suction bottle immed. Connected by MD with pinkish-red colored fluid. Approx. 25 ml drained out total into 2nd bottle. Pt. States "It feels a lot better." Left lateral pleurix tube redressed by MD. Hulen Skains for STAT portable CxR.

## 2017-02-06 NOTE — Progress Notes (Signed)
MD changed pt. Dressing to hand and forearm. Pt. States "I fell at home & it ripped off my skin." MD removed old drsg., then applied Vaseline gauze x 2, 4x4's, Kerlix and paper tape. Pt. Tolerated well.

## 2017-02-06 NOTE — Progress Notes (Signed)
Dr. Genevive Bi at bedside : MD infused TPA 10 mg in 50 ml NS into Pt. Left lateral pleurix tube. Tube capped off by MD.

## 2017-02-09 ENCOUNTER — Telehealth: Payer: Self-pay | Admitting: Family Medicine

## 2017-02-09 LAB — BASIC METABOLIC PANEL
BUN/Creatinine Ratio: 40
BUN: 72 — AB (ref 4–21)
CALCIUM: 8.8
CO2: 24
Chloride: 100
Creat: 1.79
EGFR (Non-African Amer.): 33
MAGNESIUM: 2.3
Potassium: 4.7
Sodium: 139

## 2017-02-09 NOTE — Telephone Encounter (Signed)
Spoke with daughter Cristoval Teall who called today regarding updates on Aimar' lasix and potassium dosing.  She states that one of the [place] will be attempting to drain fluid from his chest tube / pleurex today at home. We have had problems with this for past few weeks with it clogging and causing issues with poor draining, however Dr Genevive Bi general surgery on past Friday performed a fibrinolysis with tpa to remove potential clot, removed 500cc fluid  Secondly, we are adjusting his Lasix and potassium supplement given his difficulty with Acute on Chronic Kidney Disease in setting of Cardiovascular disease severe aortic stenosis. And she is asking dose going forward. I advised that I would like to wait until see if fluid can be drained from tube and if labs can be drawn today by home health BMET and Mag to check K, Mag, Cr first before recommending dose. My concern is still to avoid additional lasix if possible to avoid harm with AoCKD.  He is currently off Lasix and K. And will wait to hear back after labs and update if fluid can be drawn. They cannot make it into office due to his debilitation. Cannot get lab drawn here in office.  I have reached out to [facility] about a lab draw. And asked Debbie to do the same.  Nobie Putnam, [facility] 02/09/2017, 9:06 AM

## 2017-02-09 NOTE — Telephone Encounter (Signed)
Received call from Shanon Brow from [facility] today confirming lab draws. Debbie notified him to draw the lab in addition to wound care for his forearm. He was not aware, and he called our office for verbal orders. I advised that we are requesting BMET and Magnesium. He will draw these labs today. He will do wound care and also I asked if he could re-attempt drain on the pleurex catheter to see how much fluid is able to be drained, he will update [place] later on this.  Nobie Putnam, [facility] 02/09/2017, 12:02 PM

## 2017-02-10 ENCOUNTER — Encounter: Payer: Self-pay | Admitting: Cardiothoracic Surgery

## 2017-02-11 ENCOUNTER — Ambulatory Visit: Payer: Medicare Other | Admitting: Family Medicine

## 2017-02-12 ENCOUNTER — Telehealth: Payer: Self-pay | Admitting: Family Medicine

## 2017-02-12 ENCOUNTER — Encounter: Payer: Self-pay | Admitting: Family Medicine

## 2017-02-12 NOTE — Telephone Encounter (Signed)
See last telephone conversation on 02/09/17 regarding Hospice Management of this patient.  Received fax today from [facility] with lab results drawn on 10/22, see results and result note. Reassuring with improved Creatinine 1.79, down from >2, also BUN remains elevated but is slightly improved from 79 to 72, K is no longer low it is upper end of normal 4.7, and Magnesium is normal.  He has remained off lasix and potassium for past several days now.  I called [name], patient's daughter, to review these results. Questions answered. She states that he seems to be declining fairly steadily, with decreased activity, energy, and problem still with short of breath, and difficulty getting up to void frequently. He had 500 cc drained from pleurex on Monday 10/22, now yesterday no fluid able to be drained.  She does not endorse any dramatic worsening swelling, other than what he has had for while now. Lasix makes him void more frequently but does not seem to help other symptoms.  I expressed that at this point, we can stay off Lasix to avoid further damage to his kidneys. He can use it ONLY PRN if dramatic change in swelling, or shortness of breath. STOP Potassium supplement, and he may take this only if taking lasix.  Hospice continues to help, and will update if anything changes.  Nobie Putnam, DO [facility] 02/12/2017, 12:50 PM

## 2017-02-12 NOTE — Telephone Encounter (Signed)
Jason Wade with [facility] needs clarification on lasix and potassium.  Her call back number is [phone] and fax [phone]

## 2017-02-13 NOTE — Telephone Encounter (Signed)
Rasheda notified.

## 2017-02-18 NOTE — H&P (View-Only) (Signed)
Cardiology Office Note  Date:  02/20/2017   ID:  Jason Wade, DOB February 09, 1924, MRN [id number]  PCP:  Jason Hauser, DO   Chief Complaint  Patient presents with  . OTHER    6 month f/u c/o rapid heart beat and swelling. Pt has been off furosemide/potassium due to Creatine/bun results. Meds reviewed verbally with pt.    HPI:  Jason Wade is a 83 year old gentleman with a history of  PAD with carotid endarterectomy on the right squamous cell cancer, resection, recurrence with long course of XRT, s/p chemotherapy,  aortic valve stenosis, moderate on echocardiogram August 2017 Mean gradient 29 mmHg, peak velocity 337 paroxysmal atrial fibrillation/flutter  who presents for routine follow-up of his atrial fibrillation  Chronic Right arm lymphedema, had swelling, blistering He presents today with several family members including his daughter and son  In follow-up today, family reports that he is not eating very much Will eat a quarter of a centimeter which, missing meals Low protein intake  He has had dramatic leg swelling, pitting edema Pleurx catheter in place on the left, minimal output noted  Simvastatin held for symptoms of myalgias Leg pain improved  Denies having shortness of breath, legs are weak, unable to walk Tremendous decline in his weight over the past several weeks to months   He is taking Xarelto 20 mg daily No bleeding  EKG personally reviewed by myself on today's visit shows atrial fibrillation, rate 75 bpm , nonspecific ST and T wave abnormality V4 through V6, 1 and aVL  Other past medical history reviewed  carotid endarterectomy on the right in February 2015  History of lymphedema in his right arm and has compression therapy on the arm by regular basis. Previous surgery with lymph node resection on the right.   Previous fall in November 2013 as he walked at [employer]. He cut his head and needed several stitches. Since then his balance has  been adequate though he uses a cane. He has a pinched nerve in his leg, chronic neuropathy.    PMH:   has a past medical history of BPH (benign prostatic hyperplasia); Carotid arterial disease ([place]); Chronic diastolic CHF (congestive heart failure) ([place]); Chronic low back pain; CKD (chronic kidney disease), stage II; Dyspnea; Essential hypertension; Falls; Gout; Great toe pain, right (08/30/2016); HLD (hyperlipidemia); Hypothyroidism; Lower extremity neuropathy; Neuropathy; PAF (paroxysmal atrial fibrillation) ([place]); Severe aortic stenosis; and Squamous cell carcinoma of skin.  PSH:    Past Surgical History:  Procedure Laterality Date  . CARDIAC CATHETERIZATION  04-08-2005  . CAROTID ENDARTERECTOMY    . CHEST TUBE INSERTION Left 11/03/2016   Procedure: CHEST TUBE INSERTION;  Surgeon: [name], MD;  Location: [facility] ORS;  Service: General;  Laterality: Left;  . KNEE SURGERY  02-09-2007   replacement  . LAMINECTOMY AND MICRODISCECTOMY LUMBAR SPINE      Bilateral  at L4-5 followed by L4-5 right microdiskectomy.  Marland Kitchen LYMPH NODE DISSECTION      Current Outpatient Prescriptions  Medication Sig Dispense Refill  . allopurinol (ZYLOPRIM) 100 MG tablet Take 1 tablet (100 mg total) by mouth daily. 90 tablet 1  . bacitracin ointment Apply to affected area bid 30 g 0  . cloNIDine (CATAPRES) 0.3 MG tablet Take 0.3 mg by mouth 2 (two) times daily.    . colchicine 0.6 MG tablet 1.2 mg once, then 0.6 mg 1 hour after. Then 0.6 mg daily. 90 tablet 1  . hydrochlorothiazide (HYDRODIURIL) 25 MG tablet TAKE 1 TABLET (25 MG  TOTAL) BY MOUTH DAILY. 90 tablet 3  . metoprolol succinate (TOPROL-XL) 100 MG 24 hr tablet TAKE 1 TABLET DAILY TAKE   WITH OR IMMEDIATELY        FOLLOWING A MEAL 90 tablet 3  . SYNTHROID 100 MCG tablet TAKE 1 TABLET DAILY 90 tablet 3  . terazosin (HYTRIN) 2 MG capsule TAKE 1 CAPSULE AT BEDTIME 90 capsule 2  . XARELTO 20 MG TABS tablet TAKE 1 TABLET DAILY WITH   SUPPER 90 tablet 3   No  current facility-administered medications for this visit.      Allergies:   Simvastatin   Social History:  The patient  reports that he has never smoked. He has never used smokeless tobacco. He reports that he does not drink alcohol or use drugs.   Family History:   family history includes Hypertension in his mother; Stroke in his mother.    Review of Systems: Review of Systems  Constitutional: Negative.        Weight gain  Respiratory: Positive for shortness of breath.   Cardiovascular: Positive for leg swelling.  Gastrointestinal: Negative.   Musculoskeletal: Negative.   Neurological: Negative.   Psychiatric/Behavioral: Negative.   All other systems reviewed and are negative.    PHYSICAL EXAM: VS:  BP 98/60 (BP Location: Left Arm, Patient Position: Sitting, Cuff Size: Normal)   Pulse 75   Ht 5\' 5"  (1.651 m)   Wt 160 lb (72.6 kg)   BMI 26.63 kg/m  , BMI Body mass index is 26.63 kg/m. GEN: Appears pale, weak, ill in a wheelchair, in no acute distress  HEENT: normal  Neck: no JVD, carotid bruits, or masses Cardiac: Irregularly irregular , 3/6 SEM RSB,  No rubs, or gallops, 2+ pitting edema to the mid shins Respiratory: Clear with small region of dullness of the left base, Pleurx catheter in place normal work of breathing GI: soft, nontender, nondistended, + BS MS: no deformity or atrophy  Skin: warm and dry, no rash Neuro:  Strength and sensation are intact Psych: euthymic mood, full affect    Recent Labs: 01/29/2017: ALT 18; Hemoglobin 11.8; Platelets 255 02/09/2017: BUN 72; Creat 1.79; Magnesium 2.3; Potassium 4.7; Sodium 139    Lipid Panel Lab Results  Component Value Date   CHOL 97 08/08/2016   HDL 36 (L) 08/08/2016   LDLCALC 53 08/08/2016   TRIG 41 08/08/2016      Wt Readings from Last 3 Encounters:  02/20/17 160 lb (72.6 kg)  01/30/17 158 lb 3.2 oz (71.8 kg)  01/29/17 160 lb (72.6 kg)       ASSESSMENT AND PLAN:  PAF (paroxysmal atrial  fibrillation) (HCC) - Plan: EKG 12-Lead Rate well controlled Not a candidate for Xarelto given his creatinine clearance less than 50, estimated at 30 We will change him to Eliquis 2.5 mg twice daily, lower dose based on renal dysfunction/creatinine greater than 1.4 and age greater than 80  Aortic valve stenosis At least moderate August 2017 Repeat echocardiogram ordered August 2018 Currently not a candidate for TAVR he is not eating, Albumin 2.6, third spacing  Pleural effusion Taking Lasix only as needed Stressed importance of nutrition Pleurx catheter in place on the left, minimal pulled out on recent attempt per the family  Chronic diastolic CHF (congestive heart failure) (HCC) Currently taking Lasix with potassium as needed Difficult to determine when to take given tremendous leg swelling secondary to low albumin, anorexia  Essential hypertension Blood pressure running low, likely from dramatic weight loss  and third spacing/anasarca Recommended he decrease clonidine down to 1.5 mg twice daily, previously 3 mg twice daily Family will monitor blood pressure, may need to wean off the clonidine if blood pressure continues to run low  Pure hypercholesterolemia He is off his statin, had myalgias  Failure to thrive Rapid decline over the past 6 months, now with anorexia, albumin in the 2 range, third spacing with dramatic leg swelling, getting weaker Outlook is poor Unless he turns around quickly, may need palliative consult   Total encounter time more than 45 minutes  Greater than 50% was spent in counseling and coordination of care with the patient   Disposition:   F/U  6 months   Orders Placed This Encounter  Procedures  . EKG 12-Lead     Signed, Jason Wade, M.D., Ph.D. 02/20/2017  [place], [place]

## 2017-02-18 NOTE — Progress Notes (Signed)
Cardiology Office Note  Date:  02/20/2017   ID:  Jason Wade, DOB [birth year], MRN [id number]  PCP:  [name], DO   Chief Complaint  Patient presents with  . OTHER    6 month f/u c/o rapid heart beat and swelling. Pt has been off furosemide/potassium due to Creatine/bun results. Meds reviewed verbally with pt.    HPI:  Jason Wade is a 83 year old gentleman with a history of  PAD with carotid endarterectomy on the right squamous cell cancer, resection, recurrence with long course of XRT, s/p chemotherapy,  aortic valve stenosis, moderate on echocardiogram August 2017 Mean gradient 29 mmHg, peak velocity 337 paroxysmal atrial fibrillation/flutter  who presents for routine follow-up of his atrial fibrillation  Chronic Right arm lymphedema, had swelling, blistering He presents today with several family members including his daughter and son  In follow-up today, family reports that he is not eating very much Will eat a quarter of a centimeter which, missing meals Low protein intake  He has had dramatic leg swelling, pitting edema Pleurx catheter in place on the left, minimal output noted  Simvastatin held for symptoms of myalgias Leg pain improved  Denies having shortness of breath, legs are weak, unable to walk Tremendous decline in his weight over the past several weeks to months   He is taking Xarelto 20 mg daily No bleeding  EKG personally reviewed by myself on today's visit shows atrial fibrillation, rate 75 bpm , nonspecific ST and T wave abnormality V4 through V6, 1 and aVL  Other past medical history reviewed  carotid endarterectomy on the right in February 2015  History of lymphedema in his right arm and has compression therapy on the arm by regular basis. Previous surgery with lymph node resection on the right.   Previous fall in November 2013 as he walked at [employer]. He cut his head and needed several stitches. Since then his balance has  been adequate though he uses a cane. He has a pinched nerve in his leg, chronic neuropathy.    PMH:   has a past medical history of BPH (benign prostatic hyperplasia); Carotid arterial disease ([place]); Chronic diastolic CHF (congestive heart failure) ([place]); Chronic low back pain; CKD (chronic kidney disease), stage II; Dyspnea; Essential hypertension; Falls; Gout; Great toe pain, right (08/30/2016); HLD (hyperlipidemia); Hypothyroidism; Lower extremity neuropathy; Neuropathy; PAF (paroxysmal atrial fibrillation) ([place]); Severe aortic stenosis; and Squamous cell carcinoma of skin.  PSH:    Past Surgical History:  Procedure Laterality Date  . CARDIAC CATHETERIZATION  04-08-2005  . CAROTID ENDARTERECTOMY    . CHEST TUBE INSERTION Left 11/03/2016   Procedure: CHEST TUBE INSERTION;  Surgeon: [name], MD;  Location: [facility] ORS;  Service: General;  Laterality: Left;  . KNEE SURGERY  02-09-2007   replacement  . LAMINECTOMY AND MICRODISCECTOMY LUMBAR SPINE      Bilateral  at L4-5 followed by L4-5 right microdiskectomy.  Marland Kitchen LYMPH NODE DISSECTION      Current Outpatient Prescriptions  Medication Sig Dispense Refill  . allopurinol (ZYLOPRIM) 100 MG tablet Take 1 tablet (100 mg total) by mouth daily. 90 tablet 1  . bacitracin ointment Apply to affected area bid 30 g 0  . cloNIDine (CATAPRES) 0.3 MG tablet Take 0.3 mg by mouth 2 (two) times daily.    . colchicine 0.6 MG tablet 1.2 mg once, then 0.6 mg 1 hour after. Then 0.6 mg daily. 90 tablet 1  . hydrochlorothiazide (HYDRODIURIL) 25 MG tablet TAKE 1 TABLET (25 MG  TOTAL) BY MOUTH DAILY. 90 tablet 3  . metoprolol succinate (TOPROL-XL) 100 MG 24 hr tablet TAKE 1 TABLET DAILY TAKE   WITH OR IMMEDIATELY        FOLLOWING A MEAL 90 tablet 3  . SYNTHROID 100 MCG tablet TAKE 1 TABLET DAILY 90 tablet 3  . terazosin (HYTRIN) 2 MG capsule TAKE 1 CAPSULE AT BEDTIME 90 capsule 2  . XARELTO 20 MG TABS tablet TAKE 1 TABLET DAILY WITH   SUPPER 90 tablet 3   No  current facility-administered medications for this visit.      Allergies:   Simvastatin   Social History:  The patient  reports that he has never smoked. He has never used smokeless tobacco. He reports that he does not drink alcohol or use drugs.   Family History:   family history includes Hypertension in his mother; Stroke in his mother.    Review of Systems: Review of Systems  Constitutional: Negative.        Weight gain  Respiratory: Positive for shortness of breath.   Cardiovascular: Positive for leg swelling.  Gastrointestinal: Negative.   Musculoskeletal: Negative.   Neurological: Negative.   Psychiatric/Behavioral: Negative.   All other systems reviewed and are negative.    PHYSICAL EXAM: VS:  BP 98/60 (BP Location: Left Arm, Patient Position: Sitting, Cuff Size: Normal)   Pulse 75   Ht 5\' 5"  (1.651 m)   Wt 160 lb (72.6 kg)   BMI 26.63 kg/m  , BMI Body mass index is 26.63 kg/m. GEN: Appears pale, weak, ill in a wheelchair, in no acute distress  HEENT: normal  Neck: no JVD, carotid bruits, or masses Cardiac: Irregularly irregular , 3/6 SEM RSB,  No rubs, or gallops, 2+ pitting edema to the mid shins Respiratory: Clear with small region of dullness of the left base, Pleurx catheter in place normal work of breathing GI: soft, nontender, nondistended, + BS MS: no deformity or atrophy  Skin: warm and dry, no rash Neuro:  Strength and sensation are intact Psych: euthymic mood, full affect    Recent Labs: 01/29/2017: ALT 18; Hemoglobin 11.8; Platelets 255 02/09/2017: BUN 72; Creat 1.79; Magnesium 2.3; Potassium 4.7; Sodium 139    Lipid Panel Lab Results  Component Value Date   CHOL 97 08/08/2016   HDL 36 (L) 08/08/2016   LDLCALC 53 08/08/2016   TRIG 41 08/08/2016      Wt Readings from Last 3 Encounters:  02/20/17 160 lb (72.6 kg)  01/30/17 158 lb 3.2 oz (71.8 kg)  01/29/17 160 lb (72.6 kg)       ASSESSMENT AND PLAN:  PAF (paroxysmal atrial  fibrillation) (HCC) - Plan: EKG 12-Lead Rate well controlled Not a candidate for Xarelto given his creatinine clearance less than 50, estimated at 30 We will change him to Eliquis 2.5 mg twice daily, lower dose based on renal dysfunction/creatinine greater than 1.4 and age greater than 80  Aortic valve stenosis At least moderate August 2017 Repeat echocardiogram ordered August 2018 Currently not a candidate for TAVR he is not eating, Albumin 2.6, third spacing  Pleural effusion Taking Lasix only as needed Stressed importance of nutrition Pleurx catheter in place on the left, minimal pulled out on recent attempt per the family  Chronic diastolic CHF (congestive heart failure) (HCC) Currently taking Lasix with potassium as needed Difficult to determine when to take given tremendous leg swelling secondary to low albumin, anorexia  Essential hypertension Blood pressure running low, likely from dramatic weight loss  and third spacing/anasarca Recommended he decrease clonidine down to 1.5 mg twice daily, previously 3 mg twice daily Family will monitor blood pressure, may need to wean off the clonidine if blood pressure continues to run low  Pure hypercholesterolemia He is off his statin, had myalgias  Failure to thrive Rapid decline over the past 6 months, now with anorexia, albumin in the 2 range, third spacing with dramatic leg swelling, getting weaker Outlook is poor Unless he turns around quickly, may need palliative consult   Total encounter time more than 45 minutes  Greater than 50% was spent in counseling and coordination of care with the patient   Disposition:   F/U  6 months   Orders Placed This Encounter  Procedures  . EKG 12-Lead     Signed, Esmond Plants, M.D., Ph.D. 02/20/2017  [place], [place]

## 2017-02-20 ENCOUNTER — Encounter: Payer: Self-pay | Admitting: Cardiovascular Disease

## 2017-02-20 ENCOUNTER — Ambulatory Visit (INDEPENDENT_AMBULATORY_CARE_PROVIDER_SITE_OTHER): Payer: Medicare Other | Admitting: Cardiovascular Disease

## 2017-02-20 VITALS — BP 98/60 | HR 75 | Ht 65.0 in | Wt 160.0 lb

## 2017-02-20 DIAGNOSIS — N184 Chronic kidney disease, stage 4 (severe): Secondary | ICD-10-CM | POA: Diagnosis not present

## 2017-02-20 DIAGNOSIS — I480 Paroxysmal atrial fibrillation: Secondary | ICD-10-CM | POA: Diagnosis not present

## 2017-02-20 DIAGNOSIS — E785 Hyperlipidemia, unspecified: Secondary | ICD-10-CM | POA: Diagnosis not present

## 2017-02-20 DIAGNOSIS — I6521 Occlusion and stenosis of right carotid artery: Secondary | ICD-10-CM | POA: Diagnosis not present

## 2017-02-20 DIAGNOSIS — I350 Nonrheumatic aortic (valve) stenosis: Secondary | ICD-10-CM | POA: Diagnosis not present

## 2017-02-20 DIAGNOSIS — I10 Essential (primary) hypertension: Secondary | ICD-10-CM

## 2017-02-20 DIAGNOSIS — I5032 Chronic diastolic (congestive) heart failure: Secondary | ICD-10-CM | POA: Diagnosis not present

## 2017-02-20 MED ORDER — APIXABAN 2.5 MG PO TABS: 2.5000 mg | ORAL_TABLET | Freq: Two times a day (BID) | ORAL | 3 refills | Status: AC

## 2017-02-20 MED ORDER — APIXABAN 2.5 MG PO TABS: 2.5000 mg | ORAL_TABLET | Freq: Two times a day (BID) | ORAL | 0 refills | Status: DC

## 2017-02-20 NOTE — Patient Instructions (Addendum)
Medication Instructions:   Please cut the clonidine in 1/2 twice a day Monitor BP  Please stop xarelto Please start Sunday eliquis 2.5 mg twice a day  Medication Samples have been provided to the patient.  Drug name: Eliquis       Strength: 2.5 mg        Qty: 2 boxes  LOT: [id number]  Exp.Date: 12/19   Labwork:  No new labs needed  Testing/Procedures:  No further testing at this time   Follow-Up: It was a pleasure seeing you in the office today. Please call us if you have new issues that need to be addressed before your next appt.  [phone]  Your physician wants you to follow-up in: 6 months.  You will receive a reminder letter in the mail two months in advance. If you don't receive a letter, please call our office to schedule the follow-up appointment.  If you need a refill on your cardiac medications before your next appointment, please call your pharmacy.

## 2017-02-24 ENCOUNTER — Telehealth: Payer: Self-pay | Admitting: Cardiovascular Disease

## 2017-02-24 NOTE — Telephone Encounter (Signed)
Pt daughter is returning your call. 

## 2017-02-24 NOTE — Telephone Encounter (Signed)
Spoke with patients daughter per release form. She reports that since decreasing the clonidine in 1/2 that patient has had increased swelling. The home health nurse Shanon Brow had him resume furosemide for a few days until we can see what Dr. Rockey Situ recommends. She does not have any blood pressure readings available but provided me with Shanon Brow RN phone number to call and review all of this information with him. Advised her that I would talk with him and then forward to Dr. Rockey Situ for review and then be in touch with any changes or recommendations. She was appreciative for the call with no further questions at this time.

## 2017-02-24 NOTE — Telephone Encounter (Signed)
Pt daughter calling stating pt was seen last week here in office Dr Rockey Situ cut patient's Clonidine in half Pt since then has had more edema all over Hospice nurse has put patient back on Lasix today Wednesday and Friday to see if this helps  Hospice nurse Waunita Schooner [phone]  Please call back

## 2017-02-24 NOTE — Telephone Encounter (Signed)
Daughter not home, husband answered, he suggested I call mobile number.  No answer on cell phone. Left message to call back.

## 2017-02-24 NOTE — Telephone Encounter (Signed)
Spoke with Kerrie Pleasure with hospice and he reports patients blood pressure was 140/70 and states that his edema is from mid chin down with significant swelling which he feels is possible 3rd spacing. He reports that if edema was on a scale he would be 6+ or more. He reports that it is up in his thoracic area, right arm, and in his feet and legs. He reports that patient is noncompliant with wearing oxygen and also with hospital bed. He states that he sits in recliner all of the time and refuses to get in the hospital bed. He states they have tried to increase protein but that he is noncompliant with diet as well. He states patient is not able to stand without assistance and has dyspnea even at rest. He reports no drainage with Pleurx drain and that it is patent. Due to increased swelling and patient condition they wanted to alert [place] to these changes. Patient had some furosemide on hand and they instructed him to take it for a few days pending Dr. [name] recommendations. Advised him that I would route this message over to Dr. Rockey Situ for review and would be in touch. He was appreciative for the call and had no further questions at this time.

## 2017-02-25 NOTE — Telephone Encounter (Signed)
Patient was seen November 2 Swelling is from third spacing Nutrition has been poor, albumin very low Diuretics likely of limited benefit Nutrition is key, would add boost or Ensure if he will tolerate this Could try compression hose, Ace wraps for swelling.  All temporary measures

## 2017-02-26 NOTE — Telephone Encounter (Signed)
Spoke with Jason Wade with hospice and reviewed Dr. [name] recommendations. He verbalized understanding and had no further questions at this time. Advised him to be in touch if we could assist with any further needs.

## 2017-02-26 NOTE — Telephone Encounter (Signed)
Patients daughter calling in to check on recommendations and reviewed those with her. She verbalized understanding and requested that I also call Kerrie Pleasure with hospice to review this information with him as well. Reviewed importance of nutrition, boost or Ensure, compression hose or ace wraps, and that these of course are temporary measures but diuretics are of limited benefit at this point. She verbalized understanding of our conversation with no further questions or concerns at this time.

## 2017-03-03 ENCOUNTER — Telehealth: Payer: Self-pay | Admitting: Family Medicine

## 2017-03-03 DIAGNOSIS — S41112A Laceration without foreign body of left upper arm, initial encounter: Secondary | ICD-10-CM

## 2017-03-03 DIAGNOSIS — M109 Gout, unspecified: Secondary | ICD-10-CM | POA: Insufficient documentation

## 2017-03-03 DIAGNOSIS — T148XXA Other injury of unspecified body region, initial encounter: Secondary | ICD-10-CM

## 2017-03-03 DIAGNOSIS — M1A39X0 Chronic gout due to renal impairment, multiple sites, without tophus (tophi): Secondary | ICD-10-CM

## 2017-03-03 MED ORDER — BACITRACIN ZINC 500 UNIT/GM EX OINT: TOPICAL_OINTMENT | CUTANEOUS | 2 refills | Status: AC

## 2017-03-03 MED ORDER — ALLOPURINOL 100 MG PO TABS: 100.0000 mg | ORAL_TABLET | Freq: Every day | ORAL | 1 refills | Status: AC

## 2017-03-03 MED ORDER — COLCHICINE 0.6 MG PO TABS: ORAL_TABLET | ORAL | 1 refills | Status: DC

## 2017-03-03 MED ORDER — BACITRACIN ZINC 500 UNIT/GM EX OINT: TOPICAL_OINTMENT | CUTANEOUS | 0 refills | Status: DC

## 2017-03-03 NOTE — Telephone Encounter (Signed)
Pt needs refills on allopuriniol, colchicine and bacitracine ointment sent to [facility] [address].  The call back number is [phone]

## 2017-03-03 NOTE — Telephone Encounter (Signed)
Refilled meds as requested  Nobie Putnam, DO [facility] 03/03/2017, 12:45 PM

## 2017-03-06 NOTE — Telephone Encounter (Signed)
David Calling from Hospice to let us know that patient has not had output in 1 1/2 -2 wk and has some pus coming from plurex drain   No fever no changes with lungs please call to advise

## 2017-03-09 ENCOUNTER — Other Ambulatory Visit: Payer: Self-pay

## 2017-03-09 ENCOUNTER — Telehealth: Payer: Self-pay | Admitting: *Deleted

## 2017-03-09 DIAGNOSIS — J90 Pleural effusion, not elsewhere classified: Secondary | ICD-10-CM

## 2017-03-09 NOTE — Telephone Encounter (Signed)
Shanon Brow calling from [facility] states that the patient Pleural catheter is draining pus like liquid. He states no pleural fluid has been draining from that catheter. The patient has no fever, Asymptomatic other than his cardiac issues. Shanon Brow states he talked to Dr. Hedy Camara the patient Cardiologist who referred the patient and he mentioned  talking to Dr. Faith Rogue about the catheter. Shanon Brow is concerned that if the catheter is needing to come out that the patient may be under stress in transporting to and  From the office due to his health. He is requesting a call back. His contact number is  [phone]. Please advise. Thank you

## 2017-03-09 NOTE — Telephone Encounter (Signed)
Spoke with Dr. Genevive Bi in reference to David's concern about the patient. Dr. Genevive Bi stated that he would need to see him but first he would need a chest X-ray and then come to the clinic to determine what needs to be done. I then called Shanon Brow to let him know and gave him instructions to tell the patient's family. Shanon Brow is to call me back to confirm.

## 2017-03-09 NOTE — Telephone Encounter (Signed)
Spoke with Shanon Brow RN from Hospice and he states that over the last 1-2 weeks he has not had any output from Pleurx drain. He reports that he has had 10-20 mls of pus this AM and reports patient has no fever at this time. He is concerned and wanted to make [place] aware. Reports that family directed him to call us but advised that we are not managing the Pleurx drain and that Dr. Genevive Bi would be the one to contact. Provided him with number that I have for Dr. Genevive Bi and recommended he give them a call regarding the Pleurx drain and pus drainage. He reports that patient is still noncompliant with some recommendations and the third spacing continues to be a problem. Patient has had decline and family are aware as well. Shanon Brow will reach out to Dr. Genevive Bi office and will give [place] a call back if needed.

## 2017-03-09 NOTE — Telephone Encounter (Signed)
Shanon Brow called me again and told me that he spoke to the patient's family and that they would take him tomorrow to the medical mall for a chest x-ray and then bring him here to the medical arts building to see Dr. Genevive Bi. We will see him tomorrow at 8:00 AM.

## 2017-03-10 ENCOUNTER — Ambulatory Visit
Admission: RE | Admit: 2017-03-10 | Discharge: 2017-03-10 | Disposition: A | Discharge status: Home / Self Care | Payer: Medicare Other | Source: Ambulatory Visit | Attending: Cardiothoracic Surgery | Admitting: Cardiothoracic Surgery

## 2017-03-10 ENCOUNTER — Encounter: Payer: Self-pay | Admitting: Cardiothoracic Surgery

## 2017-03-10 ENCOUNTER — Ambulatory Visit (INDEPENDENT_AMBULATORY_CARE_PROVIDER_SITE_OTHER): Payer: Medicare Other | Admitting: Cardiothoracic Surgery

## 2017-03-10 VITALS — BP 124/83 | HR 50 | Temp 97.3°F | Ht 65.0 in | Wt 160.0 lb

## 2017-03-10 DIAGNOSIS — J90 Pleural effusion, not elsewhere classified: Secondary | ICD-10-CM | POA: Diagnosis not present

## 2017-03-10 NOTE — Progress Notes (Signed)
  Patient ID: Jason Wade, male   DOB: 04-09-1924, 83 y.o.   MRN: [id number]  HISTORY: He returns today after a visit with the hospice nurse.  They have been unable to drain much fluid from the left chest Pleurx catheter over the last month.  Recently there is been some yellowish drainage from the tube.  He denies any fevers or chills.  He has had an increase in his overall lower extremity edema.  He also has had more difficulties with right upper extremity edema secondary to ill fitting garments at following his axillary node dissection.   Vitals:   03/10/17 0758  BP: 124/83  Pulse: (!) 50  Temp: (!) 97.3 F (36.3 C)  SpO2: 96%     EXAM:    Resp: Lungs are clear at the left base but are diminished at the right base..  No respiratory distress, normal effort. Heart: Irregularly irregular with loud systolic murmur Abd:  Abdomen is soft, non distended and non tender. No masses are palpable.  There is no rebound and no guarding.  Neurological: Alert and oriented to person, place, and time. Coordination normal.  Skin: Skin is warm and dry. No rash noted. No diaphoretic. No erythema. No pallor.  Psychiatric: Normal mood and affect. Normal behavior. Judgment and thought content normal.    ASSESSMENT: I did try to drain the Pleurx catheter today.  We were able to drain only a few drops from that.  His chest x-ray shows a larger right pleural effusion then in the past.  The left pleural space appears relatively dry.   PLAN:   The family and the patient would like to have the catheter removed.  Since it is not draining any significant amounts of fluid I believe that would be appropriate.  I explained to them the indications and risks of catheter removal.  I explained to them that we usually do this in the operating room for better visualization and for better control.  They understand and would like [place] to proceed.  He is on Eliquis and we will need to stop that.  We will set him up for  surgery as soon as possible.    [name], MD

## 2017-03-11 ENCOUNTER — Telehealth: Payer: Self-pay | Admitting: Cardiothoracic Surgery

## 2017-03-11 ENCOUNTER — Telehealth: Payer: Self-pay | Admitting: Cardiovascular Disease

## 2017-03-11 NOTE — Telephone Encounter (Signed)
Returned call to patient's daughter left a message for to call back.   Patient's daughter returned call at this time. She was wanting to know when she would be contacted for the chest tube removal of her father. I told her that we are currently awaiting clearance and that once that is obtain we will contact her with an appointment. Hopefully it will be the beginning of next week. She verbalized understanding at this time.

## 2017-03-11 NOTE — Telephone Encounter (Signed)
Patient's daughter called would like to discuss removing chest tube. Please advise

## 2017-03-11 NOTE — Telephone Encounter (Signed)
Received request for clearance to stop anti-coagulant for surgery to remove Pleurx catheter by Dr. Faith Rogue. Patient is on Eliquis 2.5 mg for afib and aortic valve stenosis. Please route clearance over to [phone].

## 2017-03-15 NOTE — Telephone Encounter (Signed)
Ok to stop eliquis 2 days prior to procedure

## 2017-03-16 ENCOUNTER — Telehealth: Payer: Self-pay | Admitting: General Practice

## 2017-03-16 NOTE — Telephone Encounter (Signed)
Patient's daughter is calling asking if one of our nurses will give her a call. Please call and advice.

## 2017-03-16 NOTE — Telephone Encounter (Signed)
Pt advised of pre op date/time and sx date. Sx: 03/20/17--Pleurx cath removal--Dr Genevive Bi.  Pre op: 03/17/17 @ 1:30pm--office interview.   Patient made aware to call [phone], between 1-3:00pm the day before surgery, to find out what time to arrive.

## 2017-03-16 NOTE — Telephone Encounter (Signed)
Information routed to number provided with instructions.

## 2017-03-16 NOTE — Telephone Encounter (Signed)
Spoke with Angie from Dr. Genevive Bi office and she states that Dr. Genevive Bi would like to start holding the Eliquis today and remove catheter on Friday. Spoke verbally with Dr. Rockey Situ and he was agreeable with this plan. Will route updated clearance information over to number provided.

## 2017-03-16 NOTE — Telephone Encounter (Signed)
I have spoke with [name], Dr [name] nurse. Dr Genevive Bi has requested for patient to be off his Eliquis starting today and them resume the medication the day after his procedure (Saturday). Dr Rockey Situ has agreed to this. I have advised the patient's daughter, [name], to have the patient stop his Eliquis today, per Dr [name] request. Dr [name] clearance is in the chart.   I have spoke with Dr Genevive Bi and he would like to remove the pleurx cath on Friday 03/20/17 after his morning clinic. I will send over the Surgery Form to OR and schedule a pre admit appointment.

## 2017-03-17 ENCOUNTER — Encounter
Admission: RE | Admit: 2017-03-17 | Discharge: 2017-03-17 | Disposition: A | Discharge status: Home / Self Care | Payer: Medicare Other | Source: Ambulatory Visit | Attending: Cardiothoracic Surgery | Admitting: Cardiothoracic Surgery

## 2017-03-17 ENCOUNTER — Other Ambulatory Visit: Payer: Self-pay

## 2017-03-17 DIAGNOSIS — I482 Chronic atrial fibrillation: Secondary | ICD-10-CM | POA: Diagnosis not present

## 2017-03-17 DIAGNOSIS — M791 Myalgia, unspecified site: Secondary | ICD-10-CM | POA: Diagnosis not present

## 2017-03-17 DIAGNOSIS — I4892 Unspecified atrial flutter: Secondary | ICD-10-CM | POA: Diagnosis not present

## 2017-03-17 DIAGNOSIS — I890 Lymphedema, not elsewhere classified: Secondary | ICD-10-CM | POA: Diagnosis not present

## 2017-03-17 DIAGNOSIS — I130 Hypertensive heart and chronic kidney disease with heart failure and stage 1 through stage 4 chronic kidney disease, or unspecified chronic kidney disease: Secondary | ICD-10-CM | POA: Diagnosis not present

## 2017-03-17 DIAGNOSIS — Z85828 Personal history of other malignant neoplasm of skin: Secondary | ICD-10-CM | POA: Diagnosis not present

## 2017-03-17 DIAGNOSIS — Z79899 Other long term (current) drug therapy: Secondary | ICD-10-CM | POA: Diagnosis not present

## 2017-03-17 DIAGNOSIS — Z9221 Personal history of antineoplastic chemotherapy: Secondary | ICD-10-CM | POA: Diagnosis not present

## 2017-03-17 DIAGNOSIS — I5032 Chronic diastolic (congestive) heart failure: Secondary | ICD-10-CM | POA: Diagnosis not present

## 2017-03-17 DIAGNOSIS — E039 Hypothyroidism, unspecified: Secondary | ICD-10-CM | POA: Diagnosis not present

## 2017-03-17 DIAGNOSIS — I350 Nonrheumatic aortic (valve) stenosis: Secondary | ICD-10-CM | POA: Diagnosis not present

## 2017-03-17 DIAGNOSIS — E7800 Pure hypercholesterolemia, unspecified: Secondary | ICD-10-CM | POA: Diagnosis not present

## 2017-03-17 DIAGNOSIS — J90 Pleural effusion, not elsewhere classified: Secondary | ICD-10-CM | POA: Diagnosis not present

## 2017-03-17 DIAGNOSIS — G8929 Other chronic pain: Secondary | ICD-10-CM | POA: Diagnosis not present

## 2017-03-17 DIAGNOSIS — Z7901 Long term (current) use of anticoagulants: Secondary | ICD-10-CM | POA: Diagnosis not present

## 2017-03-17 DIAGNOSIS — M109 Gout, unspecified: Secondary | ICD-10-CM | POA: Diagnosis not present

## 2017-03-17 DIAGNOSIS — I480 Paroxysmal atrial fibrillation: Secondary | ICD-10-CM | POA: Diagnosis not present

## 2017-03-17 DIAGNOSIS — N182 Chronic kidney disease, stage 2 (mild): Secondary | ICD-10-CM | POA: Diagnosis not present

## 2017-03-17 LAB — CBC WITH DIFFERENTIAL/PLATELET
BASOS ABS: 0 10*3/uL (ref 0–0.1)
BASOS PCT: 0 %
EOS ABS: 0 10*3/uL (ref 0–0.7)
Eosinophils Relative: 1 %
HEMATOCRIT: 34.9 % — AB (ref 40.0–52.0)
HEMOGLOBIN: 10.9 g/dL — AB (ref 13.0–18.0)
Lymphocytes Relative: 8 %
Lymphs Abs: 0.5 10*3/uL — ABNORMAL LOW (ref 1.0–3.6)
MCH: 26.5 pg (ref 26.0–34.0)
MCHC: 31.3 g/dL — AB (ref 32.0–36.0)
MCV: 84.6 fL (ref 80.0–100.0)
MONO ABS: 0.6 10*3/uL (ref 0.2–1.0)
Monocytes Relative: 8 %
NEUTROS ABS: 6.1 10*3/uL (ref 1.4–6.5)
NEUTROS PCT: 83 %
Platelets: 238 10*3/uL (ref 150–440)
RBC: 4.13 MIL/uL — ABNORMAL LOW (ref 4.40–5.90)
RDW: 22.8 % — AB (ref 11.5–14.5)
WBC: 7.3 10*3/uL (ref 3.8–10.6)

## 2017-03-17 LAB — PROTIME-INR
INR: 1.35
Prothrombin Time: 16.6 seconds — ABNORMAL HIGH (ref 11.4–15.2)

## 2017-03-17 LAB — COMPREHENSIVE METABOLIC PANEL
ALBUMIN: 2.1 g/dL — AB (ref 3.5–5.0)
ALT: 14 U/L — ABNORMAL LOW (ref 17–63)
ANION GAP: 10 (ref 5–15)
AST: 28 U/L (ref 15–41)
Alkaline Phosphatase: 68 U/L (ref 38–126)
BILIRUBIN TOTAL: 0.7 mg/dL (ref 0.3–1.2)
BUN: 89 mg/dL — AB (ref 6–20)
CO2: 27 mmol/L (ref 22–32)
Calcium: 8.6 mg/dL — ABNORMAL LOW (ref 8.9–10.3)
Chloride: 103 mmol/L (ref 101–111)
Creatinine, Ser: 1.72 mg/dL — ABNORMAL HIGH (ref 0.61–1.24)
GFR calc Af Amer: 39 mL/min — ABNORMAL LOW (ref >60)
GFR calc non Af Amer: 33 mL/min — ABNORMAL LOW (ref >60)
GLUCOSE: 108 mg/dL — AB (ref 65–99)
POTASSIUM: 3.6 mmol/L (ref 3.5–5.1)
SODIUM: 140 mmol/L (ref 135–145)
TOTAL PROTEIN: 6.1 g/dL — AB (ref 6.5–8.1)

## 2017-03-17 LAB — APTT: APTT: 33 s (ref 24–36)

## 2017-03-17 NOTE — Patient Instructions (Signed)
Your procedure is scheduled on: Friday 03/20/17 Report to [facility]. 2ND FLOOR MEDICAL MALL ENTRANCE. To find out your arrival time please call [phone] between 1PM - 3PM on Thursday 03/19/17.  Remember: Instructions that are not followed completely may result in serious medical risk, up to and including death, or upon the discretion of your surgeon and anesthesiologist your surgery may need to be rescheduled.    __X__ 1. Do not eat anything after midnight the night before your    procedure.  No gum chewing or hard candies.  You may drink clear   liquids up to 2 hours before you are scheduled to arrive at the   hospital for your procedure. Do not drink clear liquids within 2   hours of scheduled arrival to the hospital as this may lead to your   procedure being delayed or rescheduled.       Clear liquids include:   Water or Apple juice without pulp   Clear carbohydrate beverage such as Clearfast or Gatorade   Black coffee or Clear Tea (no milk, no creamer, do not add anything   to the coffee or tea)    Diabetics should only drink water   __X__ 2. No Alcohol for 24 hours before or after surgery.   ____ 3. Bring all medications with you on the day of surgery if instructed.    __X__ 4. Notify your doctor if there is any change in your medical condition     (cold, fever, infections).             __X___5. No smoking within 24 hours of your surgery.     Do not wear jewelry, make-up, hairpins, clips or nail polish.  Do not wear lotions, powders, or perfumes.   Do not shave 48 hours prior to surgery. Men may shave face and neck.  Do not bring valuables to the hospital.    [facility] is not responsible for any belongings or valuables.               Contacts, dentures or bridgework may not be worn into surgery.  Leave your suitcase in the car. After surgery it may be brought to your room.  For patients admitted to the hospital, discharge time is determined by your                 treatment team.   Patients discharged the day of surgery will not be allowed to drive home.   Please read over the following fact sheets that you were given:   MRSA Information   __X__ Take these medicines the morning of surgery with A SIP OF WATER:    1. CLONIDINE  2. METOPROLOL  3. SYNTHROID  4.  5.  6.  ____ Fleet Enema (as directed)   __X__ Use CHG Soap/SAGE wipes as directed  ____ Use inhalers on the day of surgery  ____ Stop metformin 2 days prior to surgery    ____ Take 1/2 of usual insulin dose the night before surgery and none on the morning of surgery.   __X__ Stop Coumadin/Plavix/aspirin on (BLOOD THINNERS ACCORDING TO INSTRUCTIONS)  __X__ Stop Anti-inflammatories such as Advil, Aleve, Ibuprofen, Motrin, Naproxen, Naprosyn, Goodies,powder, or aspirin products.  OK to take Tylenol.   __X__ Stop supplements, Vitamin E, Fish Oil until after surgery.    ____ Bring C-Pap to the hospital.

## 2017-03-18 ENCOUNTER — Telehealth: Payer: Self-pay

## 2017-03-18 NOTE — Telephone Encounter (Signed)
Anti-coagulant clearance obtained at this time. Clearance has been scanned into the chart and can be found under Media Tab.

## 2017-03-19 MED ORDER — CEFAZOLIN SODIUM-DEXTROSE 2-4 GM/100ML-% IV SOLN
2.0000 g | INTRAVENOUS | Status: AC
  Administered 2017-03-20: 2 g via INTRAVENOUS

## 2017-03-20 ENCOUNTER — Ambulatory Visit: Payer: Medicare Other | Admitting: Anesthesiology

## 2017-03-20 ENCOUNTER — Encounter
Admission: RE | Disposition: A | Discharge status: Home / Self Care | Payer: Self-pay | Source: Ambulatory Visit | Attending: Cardiothoracic Surgery

## 2017-03-20 ENCOUNTER — Encounter: Payer: Self-pay | Admitting: *Deleted

## 2017-03-20 ENCOUNTER — Ambulatory Visit
Admission: RE | Admit: 2017-03-20 | Discharge: 2017-03-20 | Disposition: A | Discharge status: Home / Self Care | Payer: Medicare Other | Source: Ambulatory Visit | Attending: Cardiothoracic Surgery | Admitting: Cardiothoracic Surgery

## 2017-03-20 DIAGNOSIS — E039 Hypothyroidism, unspecified: Secondary | ICD-10-CM | POA: Insufficient documentation

## 2017-03-20 DIAGNOSIS — J90 Pleural effusion, not elsewhere classified: Secondary | ICD-10-CM | POA: Insufficient documentation

## 2017-03-20 DIAGNOSIS — I130 Hypertensive heart and chronic kidney disease with heart failure and stage 1 through stage 4 chronic kidney disease, or unspecified chronic kidney disease: Secondary | ICD-10-CM | POA: Insufficient documentation

## 2017-03-20 DIAGNOSIS — M109 Gout, unspecified: Secondary | ICD-10-CM | POA: Insufficient documentation

## 2017-03-20 DIAGNOSIS — I5032 Chronic diastolic (congestive) heart failure: Secondary | ICD-10-CM | POA: Insufficient documentation

## 2017-03-20 DIAGNOSIS — Z9221 Personal history of antineoplastic chemotherapy: Secondary | ICD-10-CM | POA: Insufficient documentation

## 2017-03-20 DIAGNOSIS — E7800 Pure hypercholesterolemia, unspecified: Secondary | ICD-10-CM | POA: Insufficient documentation

## 2017-03-20 DIAGNOSIS — Z79899 Other long term (current) drug therapy: Secondary | ICD-10-CM | POA: Insufficient documentation

## 2017-03-20 DIAGNOSIS — I890 Lymphedema, not elsewhere classified: Secondary | ICD-10-CM | POA: Insufficient documentation

## 2017-03-20 DIAGNOSIS — M791 Myalgia, unspecified site: Secondary | ICD-10-CM | POA: Insufficient documentation

## 2017-03-20 DIAGNOSIS — Z7901 Long term (current) use of anticoagulants: Secondary | ICD-10-CM | POA: Insufficient documentation

## 2017-03-20 DIAGNOSIS — Z85828 Personal history of other malignant neoplasm of skin: Secondary | ICD-10-CM | POA: Insufficient documentation

## 2017-03-20 DIAGNOSIS — I480 Paroxysmal atrial fibrillation: Secondary | ICD-10-CM | POA: Insufficient documentation

## 2017-03-20 DIAGNOSIS — I4892 Unspecified atrial flutter: Secondary | ICD-10-CM | POA: Insufficient documentation

## 2017-03-20 DIAGNOSIS — I482 Chronic atrial fibrillation: Secondary | ICD-10-CM | POA: Insufficient documentation

## 2017-03-20 DIAGNOSIS — N182 Chronic kidney disease, stage 2 (mild): Secondary | ICD-10-CM | POA: Insufficient documentation

## 2017-03-20 DIAGNOSIS — G8929 Other chronic pain: Secondary | ICD-10-CM | POA: Insufficient documentation

## 2017-03-20 DIAGNOSIS — I350 Nonrheumatic aortic (valve) stenosis: Secondary | ICD-10-CM | POA: Insufficient documentation

## 2017-03-20 HISTORY — PX: REMOVAL OF PLEURAL DRAINAGE CATHETER: SHX5080

## 2017-03-20 SURGERY — REMOVAL, CLOSED DRAINAGE CATHETER SYSTEM, PLEURAL
Anesthesia: Monitor Anesthesia Care | Wound class: Clean

## 2017-03-20 MED ORDER — LIDOCAINE HCL 1 % IJ SOLN
INTRAMUSCULAR | Status: DC | PRN
  Administered 2017-03-20: 15 mL

## 2017-03-20 MED ORDER — FAMOTIDINE 20 MG PO TABS
ORAL_TABLET | ORAL | Status: AC
  Administered 2017-03-20: 20 mg via ORAL
  Filled 2017-03-20: qty 1

## 2017-03-20 MED ORDER — PROPOFOL 500 MG/50ML IV EMUL
INTRAVENOUS | Status: DC | PRN
  Administered 2017-03-20: 50 ug/kg/min via INTRAVENOUS

## 2017-03-20 MED ORDER — FENTANYL CITRATE (PF) 100 MCG/2ML IJ SOLN
INTRAMUSCULAR | Status: DC | PRN
  Administered 2017-03-20: 25 ug via INTRAVENOUS

## 2017-03-20 MED ORDER — PROPOFOL 10 MG/ML IV BOLUS
INTRAVENOUS | Status: AC
  Filled 2017-03-20: qty 20

## 2017-03-20 MED ORDER — SODIUM CHLORIDE 0.9 % IV SOLN
INTRAVENOUS | Status: DC
  Administered 2017-03-20 (×2): via INTRAVENOUS

## 2017-03-20 MED ORDER — EPHEDRINE SULFATE 50 MG/ML IJ SOLN
INTRAMUSCULAR | Status: AC
  Filled 2017-03-20: qty 1

## 2017-03-20 MED ORDER — FAMOTIDINE 20 MG PO TABS
20.0000 mg | ORAL_TABLET | Freq: Once | ORAL | Status: AC
  Administered 2017-03-20: 20 mg via ORAL

## 2017-03-20 MED ORDER — FENTANYL CITRATE (PF) 100 MCG/2ML IJ SOLN
INTRAMUSCULAR | Status: AC
  Filled 2017-03-20: qty 2

## 2017-03-20 MED ORDER — CHLORHEXIDINE GLUCONATE CLOTH 2 % EX PADS: 6.0000 | MEDICATED_PAD | Freq: Once | CUTANEOUS | Status: DC

## 2017-03-20 MED ORDER — CEFAZOLIN SODIUM-DEXTROSE 2-4 GM/100ML-% IV SOLN
INTRAVENOUS | Status: AC
  Filled 2017-03-20: qty 100

## 2017-03-20 SURGICAL SUPPLY — 40 items
BLADE SURG 15 STRL LF DISP TIS (BLADE) ×1 IMPLANT
BLADE SURG 15 STRL SS (BLADE) ×4
CANISTER SUCT 1200ML W/VALVE (MISCELLANEOUS) ×2 IMPLANT
CHLORAPREP W/TINT 26ML (MISCELLANEOUS) ×2 IMPLANT
DRAIN CHEST DRY SUCT SGL (MISCELLANEOUS) ×2 IMPLANT
DRAPE INCISE IOBAN 66X45 STRL (DRAPES) ×2 IMPLANT
DRAPE LAPAROTOMY 77X122 PED (DRAPES) ×2 IMPLANT
DRSG OPSITE POSTOP 3X4 (GAUZE/BANDAGES/DRESSINGS) ×1 IMPLANT
DRSG OPSITE POSTOP 4X6 (GAUZE/BANDAGES/DRESSINGS) ×1 IMPLANT
ELECT REM PT RETURN 9FT ADLT (ELECTROSURGICAL) ×2
ELECTRODE REM PT RTRN 9FT ADLT (ELECTROSURGICAL) ×1 IMPLANT
GAUZE PETRO 3X18 (MISCELLANEOUS) ×1 IMPLANT
GAUZE SPONGE 4X4 12PLY STRL (GAUZE/BANDAGES/DRESSINGS) ×1 IMPLANT
GLOVE SURG SYN 7.5  E (GLOVE) ×1
GLOVE SURG SYN 7.5 E (GLOVE) ×1 IMPLANT
GLOVE SURG SYN 7.5 PF PI (GLOVE) ×1 IMPLANT
GOWN STRL REUS W/ TWL LRG LVL3 (GOWN DISPOSABLE) ×2 IMPLANT
GOWN STRL REUS W/TWL LRG LVL3 (GOWN DISPOSABLE) ×4
KIT PLEURX DRAIN CATH 500ML (KITS) ×2 IMPLANT
KIT RM TURNOVER STRD PROC AR (KITS) ×2 IMPLANT
LABEL OR SOLS (LABEL) ×2 IMPLANT
MARKER SKIN DUAL TIP RULER LAB (MISCELLANEOUS) ×2 IMPLANT
NEEDLE HYPO 22GX1.5 SAFETY (NEEDLE) ×1 IMPLANT
PACK BASIN MINOR ARMC (MISCELLANEOUS) ×2 IMPLANT
SUCTION FRAZIER HANDLE 10FR (MISCELLANEOUS) ×1
SUCTION TUBE FRAZIER 10FR DISP (MISCELLANEOUS) ×1 IMPLANT
SUT ETH BLK MONO 3 0 FS 1 12/B (SUTURE) ×4 IMPLANT
SUT ETHILON 4-0 (SUTURE) ×2
SUT ETHILON 4-0 FS2 18XMFL BLK (SUTURE) ×1
SUT SILK 0 (SUTURE) ×2
SUT SILK 0 30XBRD TIE 6 (SUTURE) ×1 IMPLANT
SUT SILK 1 SH (SUTURE) ×2 IMPLANT
SUT VIC AB 0 SH 27 (SUTURE) ×2 IMPLANT
SUT VIC AB 2-0 SH 27 (SUTURE) ×2
SUT VIC AB 2-0 SH 27XBRD (SUTURE) ×1 IMPLANT
SUT VIC AB 3-0 SH 27 (SUTURE) ×2
SUT VIC AB 3-0 SH 27X BRD (SUTURE) ×1 IMPLANT
SUTURE ETHLN 4-0 FS2 18XMF BLK (SUTURE) ×1 IMPLANT
SYR 20CC LL (SYRINGE) ×1 IMPLANT
WATER STERILE IRR 1000ML POUR (IV SOLUTION) ×2 IMPLANT

## 2017-03-20 NOTE — Interval H&P Note (Signed)
History and Physical Interval Note:  03/20/2017 3:37 PM  Jason Wade  has presented today for surgery, with the diagnosis of pleurx placed  The various methods of treatment have been discussed with the patient and family. After consideration of risks, benefits and other options for treatment, the patient has consented to  Procedure(s): REMOVAL OF PLEURAL DRAINAGE CATHETER (N/A) as a surgical intervention .  The patient's history has been reviewed, patient examined, no change in status, stable for surgery.  I have reviewed the patient's chart and labs.  Questions were answered to the patient's satisfaction.     [name]

## 2017-03-20 NOTE — Anesthesia Procedure Notes (Signed)
Procedure Name: MAC Date/Time: 03/20/2017 3:50 PM Performed by: Allean Found, CRNA Pre-anesthesia Checklist: Patient identified, Emergency Drugs available, Suction available, Patient being monitored and Timeout performed Patient Re-evaluated:Patient Re-evaluated prior to induction Oxygen Delivery Method: Nasal cannula Placement Confirmation: positive ETCO2 Dental Injury: Teeth and Oropharynx as per pre-operative assessment

## 2017-03-20 NOTE — Anesthesia Post-op Follow-up Note (Signed)
Anesthesia QCDR form completed.        

## 2017-03-20 NOTE — Anesthesia Postprocedure Evaluation (Addendum)
Anesthesia Post Note  Patient: Jason Wade  Procedure(s) Performed: REMOVAL OF PLEURAL DRAINAGE CATHETER (N/A )  Patient location during evaluation: PACU Anesthesia Type: MAC Level of consciousness: awake and alert Pain management: pain level controlled Vital Signs Assessment: post-procedure vital signs reviewed and stable Respiratory status: spontaneous breathing, nonlabored ventilation, respiratory function stable and patient connected to nasal cannula oxygen Cardiovascular status: blood pressure returned to baseline and stable Postop Assessment: no apparent nausea or vomiting Anesthetic complications: no     Last Vitals:  Vitals:   03/20/17 1700 03/20/17 1711  BP: 126/73 (!) 145/99  Pulse: 89 77  Resp: (!) 22   Temp: (!) 35.6 C   SpO2: 100% 97%    Last Pain:  Vitals:   03/20/17 1647  TempSrc:   PainSc: 0-No pain                 Precious Haws Markan Cazarez

## 2017-03-20 NOTE — Transfer of Care (Signed)
Immediate Anesthesia Transfer of Care Note  Patient: Jason Wade  Procedure(s) Performed: REMOVAL OF PLEURAL DRAINAGE CATHETER (N/A )  Patient Location: PACU  Anesthesia Type:MAC  Level of Consciousness: awake  Airway & Oxygen Therapy: Patient Spontanous Breathing and Patient connected to nasal cannula oxygen  Post-op Assessment: Report given to RN and Post -op Vital signs reviewed and stable  Post vital signs: Reviewed and stable  Last Vitals:  Vitals:   03/20/17 1336 03/20/17 1621  BP: (!) 105/55 102/65  Pulse: 77 (!) 59  Resp: (!) 21 16  Temp: (!) 35.9 C 36.4 C  SpO2: 100% 99%    Last Pain:  Vitals:   03/20/17 1336  TempSrc: Temporal         Complications: No apparent anesthesia complications

## 2017-03-20 NOTE — Anesthesia Preprocedure Evaluation (Signed)
Anesthesia Evaluation  Patient identified by MRN, date of birth, ID band Patient awake    Reviewed: Allergy & Precautions, H&P , NPO status , Patient's Chart, lab work & pertinent test results, reviewed documented beta blocker date and time   Airway Mallampati: II  TM Distance: >3 FB Neck ROM: full    Dental no notable dental hx. (+) Teeth Intact   Pulmonary neg pulmonary ROS, shortness of breath and with exertion,    Pulmonary exam normal breath sounds clear to auscultation       Cardiovascular Exercise Tolerance: Poor hypertension, On Medications + Peripheral Vascular Disease and +CHF  negative cardio ROS  + Valvular Problems/Murmurs AS  Rhythm:regular Rate:Normal     Neuro/Psych  Neuromuscular disease negative neurological ROS  negative psych ROS   GI/Hepatic negative GI ROS, Neg liver ROS,   Endo/Other  negative endocrine ROSdiabetesHypothyroidism   Renal/GU CRFRenal disease     Musculoskeletal   Abdominal   Peds  Hematology negative hematology ROS (+)   Anesthesia Other Findings Past Medical History: No date: BPH (benign prostatic hyperplasia) No date: Carotid arterial disease ([place])     Comment:  a. 2015: s/p R CEA. No date: Chronic diastolic CHF (congestive heart failure) (H[facility])     Comment:  a. echo 11/2015: EF 55-60%, possible HK of inf wall,               severe AS mean gradient 29, peak gradient 45, valve area               0.41, mild to mod MR, LA dilated @ 50 mild RA dilatation,              left pleural effusion No date: Chronic low back pain No date: CKD (chronic kidney disease), stage II No date: Dyspnea No date: Essential hypertension No date: Falls No date: Gout 08/30/2016: Great toe pain, right No date: HLD (hyperlipidemia) No date: Hypothyroidism No date: Lower extremity neuropathy No date: Neuropathy No date: PAF (paroxysmal atrial fibrillation) ([place])     Comment:  a. 11/2010 Echo: EF  >55%, diast dysfxn, mild conc lvh. No date: Severe aortic stenosis     Comment:  a. echo 11/2015 w/ mean gradient 29 mmHg, peak gradient               45 mmHg, valve area 0.41 cm^2 No date: Squamous cell carcinoma of skin     Comment:  a. x3 s/p local excision followed by more extensive               surgery due to lymphadenopathy on the right.   Reproductive/Obstetrics negative OB ROS                             Anesthesia Physical  Anesthesia Plan  ASA: IV  Anesthesia Plan: MAC   Post-op Pain Management:    Induction: Intravenous  PONV Risk Score and Plan: 1 and Ondansetron and Dexamethasone  Airway Management Planned: Natural Airway and Nasal Cannula  Additional Equipment:   Intra-op Plan:   Post-operative Plan:   Informed Consent: I have reviewed the patients History and Physical, chart, labs and discussed the procedure including the risks, benefits and alternatives for the proposed anesthesia with the patient or authorized representative who has indicated his/her understanding and acceptance.   Dental Advisory Given  Plan Discussed with: CRNA  Anesthesia Plan Comments: (Patient consented for risks of anesthesia including but not  limited to:  - adverse reactions to medications - risk of intubation if required - damage to teeth, lips or other oral mucosa - sore throat or hoarseness - Damage to heart, brain, lungs or loss of life  Patient voiced understanding.)        Anesthesia Quick Evaluation

## 2017-03-20 NOTE — Op Note (Signed)
  03/20/2017  4:50 PM  PATIENT:  Jason Wade  83 y.o. male  PRE-OPERATIVE DIAGNOSIS: Nonfunctioning left Pleurx catheter  POST-OPERATIVE DIAGNOSIS: Nonfunctioning left Pleurx catheter  PROCEDURE: Removal of nonfunctioning left Pleurx catheter  SURGEON:  Surgeon(s) and Role:    [name], MD - Primary  ASSISTANTS: None  ANESTHESIA: Local anesthesia with intravenous sedation  INDICATIONS FOR PROCEDURE this is a 83 year old gentleman who had a left-sided Pleurx catheter placed for recurrent pleural effusion.  Catheter now has been unable to be drained.  The family would like to have the catheter removed.  Patient is also desirous of this.  The indications and risks were explained to the patient gave his informed consent.  DICTATION: The patient was brought to the operating suite and placed in the supine position.  His left chest was tilted upwards.  Patient was prepped and draped in usual sterile fashion.  Using 1% lidocaine as a local anesthetic a skin wheal was raised along the catheter.  Traction on the catheter brought the cuff in the view and the cuff was dissected from the surrounding tissues.  The entire catheter was then removed without difficulties.  Hemostasis was complete.  A single nylon stitch was used to loosely approximate the wound.  During removal of the patient's dressing prior to prepping there was a superficial skin abrasion caused with removal.  This was treated with a Vaseline gauze and a clear plastic dressing.  Patient was then taken to the recovery room in stable condition.   [name], MD

## 2017-03-21 ENCOUNTER — Encounter: Payer: Self-pay | Admitting: Cardiothoracic Surgery

## 2017-03-21 ENCOUNTER — Other Ambulatory Visit: Payer: Self-pay | Admitting: Cardiovascular Disease

## 2017-03-21 ENCOUNTER — Encounter: Payer: Self-pay | Admitting: Family Medicine

## 2017-03-21 DIAGNOSIS — M1A39X0 Chronic gout due to renal impairment, multiple sites, without tophus (tophi): Secondary | ICD-10-CM

## 2017-03-23 ENCOUNTER — Other Ambulatory Visit: Payer: Self-pay

## 2017-03-23 MED ORDER — COLCRYS 0.6 MG PO TABS: 0.3000 mg | ORAL_TABLET | Freq: Every day | ORAL | 3 refills | Status: AC

## 2017-03-23 NOTE — Telephone Encounter (Signed)
Refill Request.  

## 2017-03-25 ENCOUNTER — Encounter: Payer: Self-pay | Admitting: Family Medicine

## 2017-03-30 ENCOUNTER — Ambulatory Visit: Payer: Medicare Other | Admitting: Family Medicine

## 2017-04-04 ENCOUNTER — Emergency Department (HOSPITAL_COMMUNITY)

## 2017-04-04 ENCOUNTER — Other Ambulatory Visit: Payer: Self-pay

## 2017-04-04 ENCOUNTER — Emergency Department (HOSPITAL_COMMUNITY)
Admission: EM | Admit: 2017-04-04 | Discharge: 2017-04-04 | Disposition: A | Discharge status: Home / Self Care | Attending: Emergency Medicine | Admitting: Emergency Medicine

## 2017-04-04 DIAGNOSIS — S0990XA Unspecified injury of head, initial encounter: Secondary | ICD-10-CM

## 2017-04-04 DIAGNOSIS — W01198A Fall on same level from slipping, tripping and stumbling with subsequent striking against other object, initial encounter: Secondary | ICD-10-CM | POA: Diagnosis not present

## 2017-04-04 DIAGNOSIS — Y999 Unspecified external cause status: Secondary | ICD-10-CM | POA: Diagnosis not present

## 2017-04-04 DIAGNOSIS — I130 Hypertensive heart and chronic kidney disease with heart failure and stage 1 through stage 4 chronic kidney disease, or unspecified chronic kidney disease: Secondary | ICD-10-CM

## 2017-04-04 DIAGNOSIS — I214 Non-ST elevation (NSTEMI) myocardial infarction: Secondary | ICD-10-CM | POA: Insufficient documentation

## 2017-04-04 DIAGNOSIS — I5032 Chronic diastolic (congestive) heart failure: Secondary | ICD-10-CM | POA: Diagnosis not present

## 2017-04-04 DIAGNOSIS — W19XXXA Unspecified fall, initial encounter: Secondary | ICD-10-CM

## 2017-04-04 DIAGNOSIS — Z79899 Other long term (current) drug therapy: Secondary | ICD-10-CM | POA: Insufficient documentation

## 2017-04-04 DIAGNOSIS — Y9389 Activity, other specified: Secondary | ICD-10-CM | POA: Diagnosis not present

## 2017-04-04 DIAGNOSIS — S0191XA Laceration without foreign body of unspecified part of head, initial encounter: Secondary | ICD-10-CM | POA: Diagnosis not present

## 2017-04-04 DIAGNOSIS — Y92002 Bathroom of unspecified non-institutional (private) residence single-family (private) house as the place of occurrence of the external cause: Secondary | ICD-10-CM | POA: Diagnosis not present

## 2017-04-04 DIAGNOSIS — N184 Chronic kidney disease, stage 4 (severe): Secondary | ICD-10-CM | POA: Insufficient documentation

## 2017-04-04 DIAGNOSIS — R109 Unspecified abdominal pain: Secondary | ICD-10-CM | POA: Diagnosis not present

## 2017-04-04 DIAGNOSIS — R601 Generalized edema: Secondary | ICD-10-CM

## 2017-04-04 DIAGNOSIS — Y92009 Unspecified place in unspecified non-institutional (private) residence as the place of occurrence of the external cause: Secondary | ICD-10-CM

## 2017-04-04 DIAGNOSIS — E039 Hypothyroidism, unspecified: Secondary | ICD-10-CM | POA: Diagnosis not present

## 2017-04-04 DIAGNOSIS — Z96651 Presence of right artificial knee joint: Secondary | ICD-10-CM | POA: Insufficient documentation

## 2017-04-04 LAB — CBC WITH DIFFERENTIAL/PLATELET
Basophils Absolute: 0 10*3/uL (ref 0.0–0.1)
Basophils Relative: 0 %
EOS ABS: 0 10*3/uL (ref 0.0–0.7)
Eosinophils Relative: 0 %
HCT: 32.6 % — ABNORMAL LOW (ref 39.0–52.0)
Hemoglobin: 10 g/dL — ABNORMAL LOW (ref 13.0–17.0)
LYMPHS ABS: 0.7 10*3/uL (ref 0.7–4.0)
Lymphocytes Relative: 8 %
MCH: 25.9 pg — ABNORMAL LOW (ref 26.0–34.0)
MCHC: 30.7 g/dL (ref 30.0–36.0)
MCV: 84.5 fL (ref 78.0–100.0)
MONO ABS: 0.6 10*3/uL (ref 0.1–1.0)
Monocytes Relative: 7 %
Neutro Abs: 7.4 10*3/uL (ref 1.7–7.7)
Neutrophils Relative %: 85 %
PLATELETS: 159 10*3/uL (ref 150–400)
RBC: 3.86 MIL/uL — AB (ref 4.22–5.81)
RDW: 22.6 % — AB (ref 11.5–15.5)
WBC: 8.7 10*3/uL (ref 4.0–10.5)

## 2017-04-04 LAB — TYPE AND SCREEN
ABO/RH(D): A POS
Antibody Screen: NEGATIVE

## 2017-04-04 LAB — PROTIME-INR
INR: 2.24
PROTHROMBIN TIME: 24.6 s — AB (ref 11.4–15.2)

## 2017-04-04 LAB — I-STAT CG4 LACTIC ACID, ED
LACTIC ACID, VENOUS: 3.01 mmol/L — AB (ref 0.5–1.9)
Lactic Acid, Venous: 3.87 mmol/L (ref 0.5–1.9)

## 2017-04-04 LAB — BASIC METABOLIC PANEL
ANION GAP: 16 — AB (ref 5–15)
BUN: 122 mg/dL — ABNORMAL HIGH (ref 6–20)
CALCIUM: 7.6 mg/dL — AB (ref 8.9–10.3)
CO2: 23 mmol/L (ref 22–32)
Chloride: 97 mmol/L — ABNORMAL LOW (ref 101–111)
Creatinine, Ser: 2.69 mg/dL — ABNORMAL HIGH (ref 0.61–1.24)
GFR, EST AFRICAN AMERICAN: 22 mL/min — AB (ref >60)
GFR, EST NON AFRICAN AMERICAN: 19 mL/min — AB (ref >60)
GLUCOSE: 110 mg/dL — AB (ref 65–99)
Potassium: 2.9 mmol/L — ABNORMAL LOW (ref 3.5–5.1)
SODIUM: 136 mmol/L (ref 135–145)

## 2017-04-04 LAB — ABO/RH: ABO/RH(D): A POS

## 2017-04-04 LAB — TROPONIN I: Troponin I: 0.23 ng/mL (ref <0.03)

## 2017-04-04 MED ORDER — SODIUM CHLORIDE 0.9 % IV BOLUS (SEPSIS)
500.0000 mL | Freq: Once | INTRAVENOUS | Status: AC
  Administered 2017-04-04: 500 mL via INTRAVENOUS

## 2017-04-04 MED ORDER — FUROSEMIDE 10 MG/ML IJ SOLN
40.0000 mg | Freq: Once | INTRAMUSCULAR | Status: DC
  Filled 2017-04-04: qty 4

## 2017-04-04 MED ORDER — SODIUM CHLORIDE 0.9 % IV BOLUS (SEPSIS): 1000.0000 mL | Freq: Once | INTRAVENOUS | Status: DC

## 2017-04-04 MED ORDER — LIDOCAINE-EPINEPHRINE (PF) 2 %-1:200000 IJ SOLN
20.0000 mL | Freq: Once | INTRAMUSCULAR | Status: AC
  Administered 2017-04-04: 20 mL
  Filled 2017-04-04: qty 20

## 2017-04-04 NOTE — ED Notes (Signed)
[facility] not taking patient at this time. Will send additional truck after 8am

## 2017-04-04 NOTE — ED Notes (Signed)
ED Provider at bedside. 

## 2017-04-04 NOTE — ED Triage Notes (Signed)
Per [facility], Pt from home. Pt is hospice pt. Pt had a fall, unsure if he tripped. Pt hit head on tub. Pt has lac above L eye, dressing applied by EMS. Pt currently on Eliquis. Pt denies headache, nausea, vomiting. Pt complaining of gen. Body aches. Pt has c-collar in place. Pt hypotensive with EMS 87/57, received 450 ml of NS.

## 2017-04-04 NOTE — ED Notes (Signed)
Per son, pt has had redness to R leg for the past two days. Redness and warmth noted to right leg

## 2017-04-04 NOTE — Care Management (Addendum)
This is a no charge note  83 year old male with a past medical history of hypertension, hyperlipidemia, hypothyroidism, gout, severe aortic stenosis, sCHF with EF 45%, BPH, carotid artery stenosis, atrial fibrillation on decrease, who presents with fall.   CT scan of head, C-spine, maxillary facial, chest and abdomen/pelvis did not show acute bony fracture or intracranial abnormalities, but showed bilateral loculated pleural effusion. Patient was also found to have left leg cellulitis, severe sepsis with hypotension, elevated lactic acid, tachycardia, tachypnea and hypothermia. Blood pressure was 70/54 initially, which responded to IV fluids, improved to 95/62. Patient was found to have positive troponin 0.23 with diffuse ST depressions on EKG, consistent with non-STEMI. Patient also has worsening renal function with creatinine 2.69, BUN 122, atrial fibrillation with RVR. Pt is anasarca which limits IV treatment for sepsis.  Given his multiple serious issues going on, his prognosis will be extremely poor. I spent lengthy time with pt and his son to have discussed about goals of care. Pt wishes to go home where he will feel more comfortable. He does not want to be admitted for any attempted IV diuresis or IV antibiotics. He also does not want a pluerex catheter for pleural effusion again. Patient is under hospice care currently. Pt is DNR. The admission will be cancelled.  Ivor Costa, MD  [facility] Pager [phone]  If 7PM-7AM, please contact night-coverage [email or web address] Password [email or web address] 04/04/2017, 6:45 AM

## 2017-04-04 NOTE — ED Notes (Signed)
Skin tear noted to L upper arm from blood pressure cuff; sock used to wrap arm, blood pressure cuff replaced. Skin tear also noted to R hand and R arm

## 2017-04-04 NOTE — ED Notes (Signed)
Provider at bedside

## 2017-04-04 NOTE — Discharge Instructions (Signed)
The stitches in your head should be removed in 1 week.  Follow-up with your hospice doctor.  Return to the ED if you change your mind about wanting to come in the hospital.

## 2017-04-04 NOTE — ED Notes (Signed)
EMS placed 18g IV to R AC; pt has hx of lymphedema with lymph node removal on R arm. IV immediately removed on arrival. Restriction band placed

## 2017-04-04 NOTE — ED Notes (Signed)
Dr.Rancour aware of repeat I-stat lactic results 3.01

## 2017-04-04 NOTE — ED Notes (Signed)
[facility] at bedside 

## 2017-04-04 NOTE — ED Notes (Signed)
Dr Niu at bedside 

## 2017-04-04 NOTE — ED Provider Notes (Signed)
[place] EMERGENCY DEPARTMENT Provider Note   CSN: [id number] Arrival date & time: 04/04/17  0235     History   Chief Complaint Chief Complaint  Patient presents with  . Fall    HPI Jason Wade is a 83 y.o. male.  Level 5 caveat.  Patient poor historian.  Patient presents from home where he had a mechanical fall striking his head against the bathtub.  He is on Eliquis.  He has a laceration above his left eye.  Denies any vision difficulties.  No headache, nausea or vomiting.  Complains of pain all over.  Hypotensive for EMS 87/57.  Patient is reported to be in hospice for unclear reasons.  No family present.  DNR present.  Chart review shows history of diastolic heart failure, CKD, recurrent pleural effusion, atrial fibrillation, aortic stenosis   The history is provided by the patient and the EMS personnel.  Fall     Past Medical History:  Diagnosis Date  . BPH (benign prostatic hyperplasia)   . Carotid arterial disease ([place])    a. 2015: s/p R CEA.  . Chronic diastolic CHF (congestive heart failure) ([place])    a. echo 11/2015: EF 55-60%, possible HK of inf wall, severe AS mean gradient 29, peak gradient 45, valve area 0.41, mild to mod MR, LA dilated @ 50 mild RA dilatation, left pleural effusion  . Chronic low back pain   . CKD (chronic kidney disease), stage II   . Dyspnea   . Essential hypertension   . Falls   . Gout   . Great toe pain, right 08/30/2016  . HLD (hyperlipidemia)   . Hypothyroidism   . Lower extremity neuropathy   . Neuropathy   . PAF (paroxysmal atrial fibrillation) ([place])    a. 11/2010 Echo: EF >55%, diast dysfxn, mild conc lvh.  . Severe aortic stenosis    a. echo 11/2015 w/ mean gradient 29 mmHg, peak gradient 45 mmHg, valve area 0.41 cm^2  . Squamous cell carcinoma of skin    a. x3 s/p local excision followed by more extensive surgery due to lymphadenopathy on the right.    Patient Active Problem List   Diagnosis Date  Noted  . Gout 03/03/2017  . Acute renal failure superimposed on stage 4 chronic kidney disease ([place]) 01/30/2017  . Hypokalemia 01/30/2017  . Recurrent pleural effusion on left 11/03/2016  . Great toe pain, right 08/30/2016  . Carotid arterial disease ([place])   . Chronic diastolic CHF (congestive heart failure) ([place])   . CKD (chronic kidney disease), stage IV ([place])   . Essential hypertension   . Lower extremity neuropathy   . PAF (paroxysmal atrial fibrillation) ([place])   . Aortic valve stenosis, severe   . BPH (benign prostatic hyperplasia) 06/05/2015  . Hypothyroidism 06/05/2015  . Hyperlipidemia 05/05/2014  . Squamous cell carcinoma of skin of right arm, including shoulder 04/22/2013    Past Surgical History:  Procedure Laterality Date  . CARDIAC CATHETERIZATION  04-08-2005  . CAROTID ENDARTERECTOMY    . CHEST TUBE INSERTION Left 11/03/2016   Procedure: CHEST TUBE INSERTION;  Surgeon: [name], MD;  Location: [facility] ORS;  Service: General;  Laterality: Left;  . JOINT REPLACEMENT Right    tkr  . KNEE SURGERY  02-09-2007   replacement  . LAMINECTOMY AND MICRODISCECTOMY LUMBAR SPINE      Bilateral  at L4-5 followed by L4-5 right microdiskectomy.  Marland Kitchen LYMPH NODE DISSECTION    . REMOVAL OF PLEURAL DRAINAGE  CATHETER N/A 03/20/2017   Procedure: REMOVAL OF PLEURAL DRAINAGE CATHETER;  Surgeon: [name], MD;  Location: [facility] ORS;  Service: Thoracic;  Laterality: N/A;       Home Medications    Prior to Admission medications   Medication Sig Start Date End Date Taking? Authorizing Provider  allopurinol (ZYLOPRIM) 100 MG tablet Take 1 tablet (100 mg total) daily by mouth. 03/03/17   Karamalegos, Devonne Doughty, DO  apixaban (ELIQUIS) 2.5 MG TABS tablet Take 1 tablet (2.5 mg total) by mouth 2 (two) times daily. 03/22/17   Minna Merritts, MD  bacitracin ointment Apply to affected area twice daily as needed for up to 2 weeks then may stop, or repeat course if other injury Patient not  taking: Reported on 03/20/2017 03/03/17 03/03/18  [name], DO  cloNIDine (CATAPRES) 0.3 MG tablet Take 0.15 mg by mouth 2 (two) times daily.     [provider]  COLCRYS 0.6 MG tablet Take 0.5 tablets (0.3 mg total) by mouth daily. 03/23/17   Karamalegos, Devonne Doughty, DO  hydrochlorothiazide (HYDRODIURIL) 25 MG tablet TAKE 1 TABLET (25 MG TOTAL) BY MOUTH DAILY. 05/27/16   Minna Merritts, MD  metoprolol succinate (TOPROL-XL) 100 MG 24 hr tablet TAKE 1 TABLET DAILY TAKE   WITH OR IMMEDIATELY        FOLLOWING A MEAL 07/28/16   Minna Merritts, MD  SYNTHROID 100 MCG tablet TAKE 1 TABLET DAILY 11/10/16   Coral Spikes, DO  terazosin (HYTRIN) 2 MG capsule TAKE 1 CAPSULE AT BEDTIME 12/10/16   Coral Spikes, DO    Family History Family History  Problem Relation Age of Onset  . Hypertension Mother   . Stroke Mother     Social History Social History   Tobacco Use  . Smoking status: Never Smoker  . Smokeless tobacco: Never Used  Substance Use Topics  . Alcohol use: No  . Drug use: No     Allergies   Simvastatin   Review of Systems Review of Systems  Unable to perform ROS: Mental status change     Physical Exam Updated Vital Signs BP (!) 82/57   Pulse (!) 123   Temp (!) 96.1 F (35.6 C) (Rectal)   Resp 18   SpO2 94%   Physical Exam  Constitutional: He is oriented to person, place, and time. He appears well-developed and well-nourished. No distress.  Moaning in pain. Oriented to person and place  HENT:  Head: Normocephalic and atraumatic.  Mouth/Throat: Oropharynx is clear and moist. No oropharyngeal exudate.  2 cm laceration above left eye, 2 cm irregular laceration to left temple bleeding controlled  Eyes: Conjunctivae and EOM are normal. Pupils are equal, round, and reactive to light.  Neck: Normal range of motion. Neck supple.  No C spine tenderness  Cardiovascular: Normal rate, normal heart sounds and intact distal pulses.  No murmur  heard. Irregular tachycardia  Pulmonary/Chest: Effort normal. No respiratory distress. He has rales.  Diminished bilaterally R>L  Abdominal: Soft. There is no tenderness. There is no rebound and no guarding.  Musculoskeletal: Normal range of motion. He exhibits edema and tenderness.  +2 edema to thighs bilaterally  Right leg is edematous with streaking erythema along medial leg No T or L-spine tenderness  Neurological: He is alert and oriented to person, place, and time. No cranial nerve deficit. He exhibits normal muscle tone. Coordination normal.  Moves all extremities. No facial droop  Skin: Skin is warm. Capillary refill takes  less than 2 seconds.  Psychiatric: He has a normal mood and affect. His behavior is normal.  Nursing note and vitals reviewed.    ED Treatments / Results  Labs (all labs ordered are listed, but only abnormal results are displayed) Labs Reviewed  CBC WITH DIFFERENTIAL/PLATELET - Abnormal; Notable for the following components:      Result Value   RBC 3.86 (*)    Hemoglobin 10.0 (*)    HCT 32.6 (*)    MCH 25.9 (*)    RDW 22.6 (*)    All other components within normal limits  BASIC METABOLIC PANEL - Abnormal; Notable for the following components:   Potassium 2.9 (*)    Chloride 97 (*)    Glucose, Bld 110 (*)    BUN 122 (*)    Creatinine, Ser 2.69 (*)    Calcium 7.6 (*)    GFR calc non Af Amer 19 (*)    GFR calc Af Amer 22 (*)    Anion gap 16 (*)    All other components within normal limits  PROTIME-INR - Abnormal; Notable for the following components:   Prothrombin Time 24.6 (*)    All other components within normal limits  TROPONIN I - Abnormal; Notable for the following components:   Troponin I 0.23 (*)    All other components within normal limits  I-STAT CG4 LACTIC ACID, ED - Abnormal; Notable for the following components:   Lactic Acid, Venous 3.87 (*)    All other components within normal limits  I-STAT CG4 LACTIC ACID, ED - Abnormal;  Notable for the following components:   Lactic Acid, Venous 3.01 (*)    All other components within normal limits  CULTURE, BLOOD (ROUTINE X 2)  CULTURE, BLOOD (ROUTINE X 2)  URINE CULTURE  URINALYSIS, ROUTINE W REFLEX MICROSCOPIC  TYPE AND SCREEN  ABO/RH    EKG  EKG Interpretation  Date/Time:  Saturday April 04 2017 03:31:18 EST Ventricular Rate:  136 PR Interval:    QRS Duration: 92 QT Interval:  284 QTC Calculation: 428 R Axis:   88 Text Interpretation:  Atrial fibrillation Borderline right axis deviation Repolarization abnormality, prob rate related Rate faster ST depressions anteriorly and laterally  Confirmed by Ezequiel Essex [id number]) on 04/04/2017 3:40:23 AM       Radiology Ct Abdomen Pelvis Wo Contrast  Result Date: 04/04/2017 CLINICAL DATA:  Status post fall. Concern for chest or abdominal injury. Generalized body aches. EXAM: CT CHEST, ABDOMEN AND PELVIS WITHOUT CONTRAST TECHNIQUE: Multidetector CT imaging of the chest, abdomen and pelvis was performed following the standard protocol without IV contrast. COMPARISON:  PET/CT performed 08/14/2010 FINDINGS: CT CHEST FINDINGS Cardiovascular: Calcification is noted at the aortic valve. Scattered coronary artery calcifications are seen. Scattered calcification is noted along the thoracic aorta and proximal great vessels, with likely moderate luminal narrowing noted along the proximal left subclavian artery. The heart remains normal in size. Mediastinum/Nodes: The mediastinum is otherwise grossly unremarkable. No mediastinal lymphadenopathy is seen. No pericardial effusion is identified. The thyroid gland is diminutive and grossly unremarkable in appearance. No axillary lymphadenopathy is seen. Lungs/Pleura: Large right and small loculated left-sided pleural effusions are noted. Underlying hazy airspace opacity and interstitial prominence may reflect mild interstitial edema. No pneumothorax is identified. No dominant mass is  seen. Musculoskeletal: No acute osseous abnormalities are identified. The visualized musculature is unremarkable in appearance. CT ABDOMEN PELVIS FINDINGS Hepatobiliary: Trace ascites is noted about the liver. The liver is unremarkable in appearance. The gallbladder  demonstrates mild wall thickening, nonspecific in the presence of ascites. Pancreas: The pancreas is within normal limits. Spleen: The spleen is unremarkable in appearance. Adrenals/Urinary Tract: The adrenal glands are unremarkable in appearance. Scattered bilateral renal cysts are noted, more prominent on the right. There is no evidence of hydronephrosis. No renal or ureteral stones are identified. Stomach/Bowel: The stomach is unremarkable in appearance. The small bowel is within normal limits. The appendix is not visualized; there is no evidence for appendicitis. Scattered diverticulosis is noted along the entirety of the colon, without evidence of diverticulitis. Vascular/Lymphatic: Scattered calcification is seen along the abdominal aorta and its branches. The abdominal aorta is otherwise grossly unremarkable. The inferior vena cava is grossly unremarkable. No retroperitoneal lymphadenopathy is seen. No pelvic sidewall lymphadenopathy is identified. Reproductive: The bladder is mildly distended and grossly unremarkable. The prostate is normal in size. Other: Diffuse soft tissue edema is seen along both flanks and about the proximal right arm, with associated skin thickening. This may reflect anasarca. Musculoskeletal: No acute osseous abnormalities are identified. There is mild chronic loss of height at vertebral body L2. The visualized musculature is unremarkable in appearance. IMPRESSION: 1. No evidence of traumatic injury to the chest, abdomen or pelvis. 2. Large right and small loculated left-sided pleural effusions noted. Underlying hazy opacity and interstitial prominence may reflect mild interstitial edema. 3. Trace ascites noted about the  liver. Gallbladder wall thickening is nonspecific in the presence of ascites. 4. Diffuse soft tissue edema along both flanks about the proximal right arm, with associated skin thickening. This may reflect anasarca. 5. Scattered coronary artery calcifications. Calcification at the aortic valve. 6. Likely moderate luminal narrowing along the proximal left subclavian artery, not well assessed without contrast. 7. Scattered bilateral renal cysts, more prominent on the right. 8. Scattered diverticulosis along the entirety of the colon, without evidence of diverticulitis. Aortic Atherosclerosis (ICD10-I70.0). Electronically Signed   By: Garald Balding M.D.   On: 04/04/2017 05:12   Dg Elbow 2 Views Right  Result Date: 04/04/2017 CLINICAL DATA:  Status post fall, with multiple lacerations and right elbow pain. Initial encounter. EXAM: RIGHT ELBOW - 2 VIEW COMPARISON:  None. FINDINGS: There is no evidence of fracture or dislocation. The visualized joint spaces are preserved. No significant joint effusion is identified, though evaluation is mildly suboptimal due to limitations in positioning. Mild diffuse soft tissue swelling is noted about the elbow. IMPRESSION: No evidence of fracture or dislocation. Electronically Signed   By: Garald Balding M.D.   On: 04/04/2017 04:11   Ct Head Wo Contrast  Result Date: 04/04/2017 CLINICAL DATA:  Initial evaluation for acute trauma, fall. EXAM: CT HEAD WITHOUT CONTRAST CT MAXILLOFACIAL WITHOUT CONTRAST CT CERVICAL SPINE WITHOUT CONTRAST TECHNIQUE: Multidetector CT imaging of the head, cervical spine, and maxillofacial structures were performed using the standard protocol without intravenous contrast. Multiplanar CT image reconstructions of the cervical spine and maxillofacial structures were also generated. COMPARISON:  Prior CT from 12/20/2016. FINDINGS: CT HEAD FINDINGS Brain: Moderate age-related cerebral atrophy. Mild chronic small vessel ischemic disease. No acute  intracranial hemorrhage. No acute large vessel territory infarct. No mass lesion or midline shift. No hydrocephalus. No extra-axial fluid collection. Vascular: No hyperdense vessel. Scattered vascular calcifications noted within the carotid siphons. Skull: Soft tissue contusion with laceration at the left supraorbital region. Calvarium intact. Other: Mastoid air cells are clear. CT MAXILLOFACIAL FINDINGS Osseous: Zygomatic arches intact. No acute maxillary fracture. Pterygoid plates intact. No acute nasal bone fracture. Nasal septum midline and intact.  No acute mandibular fracture. Mandibular condyles normally situated. No acute abnormality about the dentition. Orbits: Globes intact. Intraorbital soft tissues within normal limits. Bony orbits intact. Sinuses: Mild scattered mucosal thickening within the ethmoidal and sphenoid sinuses. No hemosinus. Soft tissues: Soft tissue contusion/ laceration present at the left periorbital region. CT CERVICAL SPINE FINDINGS Alignment: Vertebral bodies normally aligned with preservation of the normal cervical lordosis. Trace retrolisthesis of C4 on C5, likely chronic. Skull base and vertebrae: Skullbase intact. Mild rotation of C1 on C2 likely positional. Dens intact. Vertebral body heights maintained. No acute fracture. Soft tissues and spinal canal: Soft tissues of the neck demonstrate no acute abnormality. No prevertebral edema. Carotid bifurcation atherosclerosis noted. Spinal canal within normal limits. Disc levels: Prominent degenerative changes noted about the C1-2 articulation. Moderate degenerative spondylolysis present at C4-5 and C5-6. Multilevel facet arthrosis, greater on the right. Upper chest: Large right with moderate left pleural effusions, partially visualized. Interlobular septal thickening suggest pulmonary interstitial edema. No apical pneumothorax. Other: Subcentimeter lucency within the spinous process of C7 noted, favored to be benign in nature.  IMPRESSION: CT HEAD: 1. No acute intracranial process. 2. Moderate cerebral atrophy with chronic small vessel ischemic disease. CT MAXILLOFACIAL: 1. Acute left periorbital soft tissue contusion with laceration. 2. No other acute maxillofacial injury. No acute fracture. Intact globes with no retro-orbital pathology. CT CERVICAL SPINE: 1. No acute traumatic injury within the cervical spine. 2. Moderate degenerative spondylolysis at C4-5 and C5-6. 3. Large right with moderate left pleural effusions with evidence of pulmonary interstitial edema. Electronically Signed   By: Jeannine Boga M.D.   On: 04/04/2017 05:39   Ct Chest Wo Contrast  Result Date: 04/04/2017 CLINICAL DATA:  Status post fall. Concern for chest or abdominal injury. Generalized body aches. EXAM: CT CHEST, ABDOMEN AND PELVIS WITHOUT CONTRAST TECHNIQUE: Multidetector CT imaging of the chest, abdomen and pelvis was performed following the standard protocol without IV contrast. COMPARISON:  PET/CT performed 08/14/2010 FINDINGS: CT CHEST FINDINGS Cardiovascular: Calcification is noted at the aortic valve. Scattered coronary artery calcifications are seen. Scattered calcification is noted along the thoracic aorta and proximal great vessels, with likely moderate luminal narrowing noted along the proximal left subclavian artery. The heart remains normal in size. Mediastinum/Nodes: The mediastinum is otherwise grossly unremarkable. No mediastinal lymphadenopathy is seen. No pericardial effusion is identified. The thyroid gland is diminutive and grossly unremarkable in appearance. No axillary lymphadenopathy is seen. Lungs/Pleura: Large right and small loculated left-sided pleural effusions are noted. Underlying hazy airspace opacity and interstitial prominence may reflect mild interstitial edema. No pneumothorax is identified. No dominant mass is seen. Musculoskeletal: No acute osseous abnormalities are identified. The visualized musculature is  unremarkable in appearance. CT ABDOMEN PELVIS FINDINGS Hepatobiliary: Trace ascites is noted about the liver. The liver is unremarkable in appearance. The gallbladder demonstrates mild wall thickening, nonspecific in the presence of ascites. Pancreas: The pancreas is within normal limits. Spleen: The spleen is unremarkable in appearance. Adrenals/Urinary Tract: The adrenal glands are unremarkable in appearance. Scattered bilateral renal cysts are noted, more prominent on the right. There is no evidence of hydronephrosis. No renal or ureteral stones are identified. Stomach/Bowel: The stomach is unremarkable in appearance. The small bowel is within normal limits. The appendix is not visualized; there is no evidence for appendicitis. Scattered diverticulosis is noted along the entirety of the colon, without evidence of diverticulitis. Vascular/Lymphatic: Scattered calcification is seen along the abdominal aorta and its branches. The abdominal aorta is otherwise grossly unremarkable. The inferior  vena cava is grossly unremarkable. No retroperitoneal lymphadenopathy is seen. No pelvic sidewall lymphadenopathy is identified. Reproductive: The bladder is mildly distended and grossly unremarkable. The prostate is normal in size. Other: Diffuse soft tissue edema is seen along both flanks and about the proximal right arm, with associated skin thickening. This may reflect anasarca. Musculoskeletal: No acute osseous abnormalities are identified. There is mild chronic loss of height at vertebral body L2. The visualized musculature is unremarkable in appearance. IMPRESSION: 1. No evidence of traumatic injury to the chest, abdomen or pelvis. 2. Large right and small loculated left-sided pleural effusions noted. Underlying hazy opacity and interstitial prominence may reflect mild interstitial edema. 3. Trace ascites noted about the liver. Gallbladder wall thickening is nonspecific in the presence of ascites. 4. Diffuse soft tissue  edema along both flanks about the proximal right arm, with associated skin thickening. This may reflect anasarca. 5. Scattered coronary artery calcifications. Calcification at the aortic valve. 6. Likely moderate luminal narrowing along the proximal left subclavian artery, not well assessed without contrast. 7. Scattered bilateral renal cysts, more prominent on the right. 8. Scattered diverticulosis along the entirety of the colon, without evidence of diverticulitis. Aortic Atherosclerosis (ICD10-I70.0). Electronically Signed   By: Garald Balding M.D.   On: 04/04/2017 05:12   Ct Cervical Spine Wo Contrast  Result Date: 04/04/2017 CLINICAL DATA:  Initial evaluation for acute trauma, fall. EXAM: CT HEAD WITHOUT CONTRAST CT MAXILLOFACIAL WITHOUT CONTRAST CT CERVICAL SPINE WITHOUT CONTRAST TECHNIQUE: Multidetector CT imaging of the head, cervical spine, and maxillofacial structures were performed using the standard protocol without intravenous contrast. Multiplanar CT image reconstructions of the cervical spine and maxillofacial structures were also generated. COMPARISON:  Prior CT from 12/20/2016. FINDINGS: CT HEAD FINDINGS Brain: Moderate age-related cerebral atrophy. Mild chronic small vessel ischemic disease. No acute intracranial hemorrhage. No acute large vessel territory infarct. No mass lesion or midline shift. No hydrocephalus. No extra-axial fluid collection. Vascular: No hyperdense vessel. Scattered vascular calcifications noted within the carotid siphons. Skull: Soft tissue contusion with laceration at the left supraorbital region. Calvarium intact. Other: Mastoid air cells are clear. CT MAXILLOFACIAL FINDINGS Osseous: Zygomatic arches intact. No acute maxillary fracture. Pterygoid plates intact. No acute nasal bone fracture. Nasal septum midline and intact. No acute mandibular fracture. Mandibular condyles normally situated. No acute abnormality about the dentition. Orbits: Globes intact. Intraorbital  soft tissues within normal limits. Bony orbits intact. Sinuses: Mild scattered mucosal thickening within the ethmoidal and sphenoid sinuses. No hemosinus. Soft tissues: Soft tissue contusion/ laceration present at the left periorbital region. CT CERVICAL SPINE FINDINGS Alignment: Vertebral bodies normally aligned with preservation of the normal cervical lordosis. Trace retrolisthesis of C4 on C5, likely chronic. Skull base and vertebrae: Skullbase intact. Mild rotation of C1 on C2 likely positional. Dens intact. Vertebral body heights maintained. No acute fracture. Soft tissues and spinal canal: Soft tissues of the neck demonstrate no acute abnormality. No prevertebral edema. Carotid bifurcation atherosclerosis noted. Spinal canal within normal limits. Disc levels: Prominent degenerative changes noted about the C1-2 articulation. Moderate degenerative spondylolysis present at C4-5 and C5-6. Multilevel facet arthrosis, greater on the right. Upper chest: Large right with moderate left pleural effusions, partially visualized. Interlobular septal thickening suggest pulmonary interstitial edema. No apical pneumothorax. Other: Subcentimeter lucency within the spinous process of C7 noted, favored to be benign in nature. IMPRESSION: CT HEAD: 1. No acute intracranial process. 2. Moderate cerebral atrophy with chronic small vessel ischemic disease. CT MAXILLOFACIAL: 1. Acute left periorbital soft tissue contusion  with laceration. 2. No other acute maxillofacial injury. No acute fracture. Intact globes with no retro-orbital pathology. CT CERVICAL SPINE: 1. No acute traumatic injury within the cervical spine. 2. Moderate degenerative spondylolysis at C4-5 and C5-6. 3. Large right with moderate left pleural effusions with evidence of pulmonary interstitial edema. Electronically Signed   By: Jeannine Boga M.D.   On: 04/04/2017 05:39   Dg Pelvis Portable  Result Date: 04/04/2017 CLINICAL DATA:  Status post fall, with  diffuse pelvic pain. EXAM: PORTABLE PELVIS 1-2 VIEWS COMPARISON:  PET/ CT performed 08/14/2010 FINDINGS: Evaluation of the right hip is somewhat suboptimal due to limitations in positioning. No definite fracture is seen. The femoral heads are seated within their respective acetabula. Mild degenerative change is noted at the lower lumbar spine. The sacroiliac joints are grossly unremarkable. The visualized bowel gas pattern is unremarkable in appearance. IMPRESSION: No definite evidence of fracture. Evaluation of the right hip is somewhat suboptimal due to limitations in positioning. Electronically Signed   By: Garald Balding M.D.   On: 04/04/2017 04:10   Dg Chest Portable 1 View  Result Date: 04/04/2017 CLINICAL DATA:  Status post fall, with multiple soft tissue lacerations and diffuse chest pain. Initial encounter. EXAM: PORTABLE CHEST 1 VIEW COMPARISON:  Chest radiograph performed 03/10/2017 FINDINGS: A small to moderate right-sided pleural effusion is noted, increased in size from the prior study. No definite displaced rib fracture is seen. Underlying hazy opacity could reflect interstitial edema. A small left pleural effusion is seen. No pneumothorax is identified. The cardiomediastinal silhouette is borderline enlarged. No acute osseous abnormalities are identified. IMPRESSION: 1. Increased small to moderate right-sided pleural effusion, and small left pleural effusion. Underlying right-sided hazy opacity could reflect interstitial edema. 2. Borderline cardiomegaly. 3. No displaced rib fracture seen. Electronically Signed   By: Garald Balding M.D.   On: 04/04/2017 04:09   Ct Maxillofacial Wo Contrast  Result Date: 04/04/2017 CLINICAL DATA:  Initial evaluation for acute trauma, fall. EXAM: CT HEAD WITHOUT CONTRAST CT MAXILLOFACIAL WITHOUT CONTRAST CT CERVICAL SPINE WITHOUT CONTRAST TECHNIQUE: Multidetector CT imaging of the head, cervical spine, and maxillofacial structures were performed using the  standard protocol without intravenous contrast. Multiplanar CT image reconstructions of the cervical spine and maxillofacial structures were also generated. COMPARISON:  Prior CT from 12/20/2016. FINDINGS: CT HEAD FINDINGS Brain: Moderate age-related cerebral atrophy. Mild chronic small vessel ischemic disease. No acute intracranial hemorrhage. No acute large vessel territory infarct. No mass lesion or midline shift. No hydrocephalus. No extra-axial fluid collection. Vascular: No hyperdense vessel. Scattered vascular calcifications noted within the carotid siphons. Skull: Soft tissue contusion with laceration at the left supraorbital region. Calvarium intact. Other: Mastoid air cells are clear. CT MAXILLOFACIAL FINDINGS Osseous: Zygomatic arches intact. No acute maxillary fracture. Pterygoid plates intact. No acute nasal bone fracture. Nasal septum midline and intact. No acute mandibular fracture. Mandibular condyles normally situated. No acute abnormality about the dentition. Orbits: Globes intact. Intraorbital soft tissues within normal limits. Bony orbits intact. Sinuses: Mild scattered mucosal thickening within the ethmoidal and sphenoid sinuses. No hemosinus. Soft tissues: Soft tissue contusion/ laceration present at the left periorbital region. CT CERVICAL SPINE FINDINGS Alignment: Vertebral bodies normally aligned with preservation of the normal cervical lordosis. Trace retrolisthesis of C4 on C5, likely chronic. Skull base and vertebrae: Skullbase intact. Mild rotation of C1 on C2 likely positional. Dens intact. Vertebral body heights maintained. No acute fracture. Soft tissues and spinal canal: Soft tissues of the neck demonstrate no acute abnormality. No  prevertebral edema. Carotid bifurcation atherosclerosis noted. Spinal canal within normal limits. Disc levels: Prominent degenerative changes noted about the C1-2 articulation. Moderate degenerative spondylolysis present at C4-5 and C5-6. Multilevel facet  arthrosis, greater on the right. Upper chest: Large right with moderate left pleural effusions, partially visualized. Interlobular septal thickening suggest pulmonary interstitial edema. No apical pneumothorax. Other: Subcentimeter lucency within the spinous process of C7 noted, favored to be benign in nature. IMPRESSION: CT HEAD: 1. No acute intracranial process. 2. Moderate cerebral atrophy with chronic small vessel ischemic disease. CT MAXILLOFACIAL: 1. Acute left periorbital soft tissue contusion with laceration. 2. No other acute maxillofacial injury. No acute fracture. Intact globes with no retro-orbital pathology. CT CERVICAL SPINE: 1. No acute traumatic injury within the cervical spine. 2. Moderate degenerative spondylolysis at C4-5 and C5-6. 3. Large right with moderate left pleural effusions with evidence of pulmonary interstitial edema. Electronically Signed   By: Jeannine Boga M.D.   On: 04/04/2017 05:39    Procedures .Marland KitchenLaceration Repair Date/Time: 04/04/2017 6:27 AM Performed by: Ezequiel Essex, MD Authorized by: Ezequiel Essex, MD   Consent:    Consent obtained:  Verbal and emergent situation   Consent given by:  Patient   Risks discussed:  Infection, poor cosmetic result, need for additional repair and pain   Alternatives discussed:  No treatment Anesthesia (see MAR for exact dosages):    Anesthesia method:  Local infiltration   Local anesthetic:  Lidocaine 2% WITH epi Laceration details:    Location:  Face   Face location:  L eyebrow   Length (cm):  3 Repair type:    Repair type:  Simple Pre-procedure details:    Preparation:  Patient was prepped and draped in usual sterile fashion and imaging obtained to evaluate for foreign bodies Exploration:    Hemostasis achieved with:  Epinephrine and direct pressure   Wound exploration: wound explored through full range of motion and entire depth of wound probed and visualized     Contaminated: no   Treatment:    Area  cleansed with:  Betadine and saline   Amount of cleaning:  Standard   Irrigation solution:  Sterile saline   Irrigation method:  Pressure wash   Visualized foreign bodies/material removed: no   Skin repair:    Repair method:  Sutures   Suture size:  5-0   Suture material:  Nylon   Suture technique:  Simple interrupted   Number of sutures:  5 Approximation:    Approximation:  Close Post-procedure details:    Dressing:  Open (no dressing)   Patient tolerance of procedure:  Tolerated well, no immediate complications   (including critical care time)  Medications Ordered in ED Medications  lidocaine-EPINEPHrine (XYLOCAINE W/EPI) 2 %-1:200000 (PF) injection 20 mL (not administered)  sodium chloride 0.9 % bolus 500 mL (not administered)     Initial Impression / Assessment and Plan / ED Course  I have reviewed the triage vital signs and the nursing notes.  Pertinent labs & imaging results that were available during my care of the patient were reviewed by me and considered in my medical decision making (see chart for details).    Patient presents from home after mechanical fall striking his head.  He is on Eliquis.  Patient in hospice for CHF as well as aortic stenosis.  He has a history of recurrent pleural effusions and persistent leg edema.  Patient is oriented to person and place.  He has lacerations above his left eye that are sutured.  He arrives hypotensive with rapid atrial fibrillation.  Patient given gentle fluids given his known history of aortic stenosis and CHF.  EKG shows ST depressions anteriorly.  Patient denies any chest pain.  Traumatic imaging will be obtained. He appears to have cellulitis of RLE as well and has been on keflex x 1 day.  Mathews Robinsons [phone] is patient's daughter and case was discussed with her as well as patient's son Harrie Jeans at bedside.  They state patient is in hospice and they are aware of his poor prognosis.  They are concerned about his  recurrent pleural effusions and anasarca. Traumatic imaging is negative But does show large pleural effusions and anasarca. With worsening renal function and NSTEMI with Afib with RVR.  Labs concerning for cardiorenal syndrome with NSTEMI. Patient and son informed of his worsening renal function, NSTEMI, with anasarca.  Patient denies any chest pain. His breathing is stable on nasal cannula though imaging shows large pleural effusions.  Patient seen and is agreeable to admission to attempt diuresis.  D/w Dr. Blaine Hamper who has spoken with patient and son.   Patient has since changed his mind and wishes to go home.  He and his son understand that he may die from his illnesses at any time.  He wishes to go home where he is more comfortable and does not want to be admitted for any attempted IV diuresis or IV antibiotics. He also does not want a pluerex catheter for pleural effusion again.  Hospice care will see him today as the son plans to call them when they return home.  Reiterated that they are free to return to the hospital at any point in time should they decide differently. Patient and son appear to have capacity to make medical decisions and are electing to take patient home with hospice and comfort care.   CRITICAL CARE Performed by: Ezequiel Essex Total critical care time: 60 minutes Critical care time was exclusive of separately billable procedures and treating other patients. Critical care was necessary to treat or prevent imminent or life-threatening deterioration. Critical care was time spent personally by me on the following activities: development of treatment plan with patient and/or surrogate as well as nursing, discussions with consultants, evaluation of patient's response to treatment, examination of patient, obtaining history from patient or surrogate, ordering and performing treatments and interventions, ordering and review of laboratory studies, ordering and review of radiographic  studies, pulse oximetry and re-evaluation of patient's condition.   Final Clinical Impressions(s) / ED Diagnoses   Final diagnoses:  Anasarca  Cardiorenal syndrome with renal failure, stage 1-4 or unspecified chronic kidney disease, with heart failure (HCC)  NSTEMI (non-ST elevated myocardial infarction) ([place])  Fall in home, initial encounter  Traumatic head injury with multiple lacerations, initial encounter    ED Discharge Orders    None       Janiqua Friscia, Annie Main, MD 04/04/17 [id number]

## 2017-04-04 NOTE — ED Notes (Signed)
Delay in lab draw,  Pt enroute to CT. 

## 2017-04-04 NOTE — ED Notes (Signed)
Significant swelling noted to R elbow after removed IV. Compression bandage applied to area

## 2017-04-09 ENCOUNTER — Telehealth: Payer: Self-pay

## 2017-04-09 LAB — CULTURE, BLOOD (ROUTINE X 2)
Culture: NO GROWTH
Culture: NO GROWTH
Special Requests: ADEQUATE
Special Requests: ADEQUATE

## 2017-04-09 NOTE — Telephone Encounter (Signed)
Spoke with Dione Booze CMA and reviewed chart, also spoke with Mathews Robinsons, daughter.  I called [facility] to give verbal order authorization to triage to initiate urgent transfer services to their hospice and may need inpatient or hospice home if declining condition further. He had recently hospitalization with NSTEMI, AKI worsening, Sepsis elevated lactic acid.  Currently still on [facility], I sent message to them, they will coordinate with [facility] to transfer care  My request was for triage to see patient today, they will work on it, I do not know for sure if today or tomorrow to start services.  Paperwork has been faxed.  Nobie Putnam, DO [facility] 04/09/2017, 1:06 PM

## 2017-04-09 NOTE — Telephone Encounter (Signed)
Patient son has called and is requesting an urgent referral to [facility].  Hospice is expecting the referral so they can get out there today. Patient is declining rapidly .

## 2017-04-21 DEATH — deceased

## 2017-05-22 DEATH — deceased

## 2017-09-21 IMAGING — US US THORACENTESIS ASP PLEURAL SPACE W/IMG GUIDE
1 series · 2 of 2 positions shown · non-contrast
Comparison: none

INDICATION: Left pleural effusion

[Series 1: us thoracentesis asp pleural space w/img guide · 0.24mm/px · 2 of 2 slices shown]
[im 1/2]
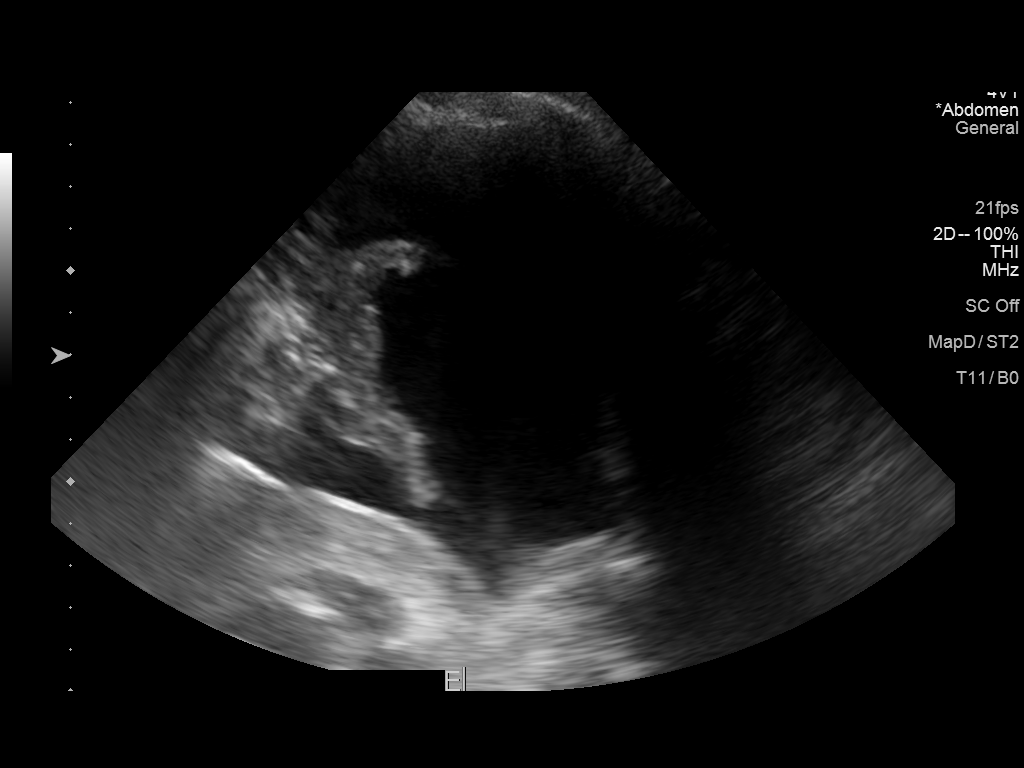
[im 2/2]
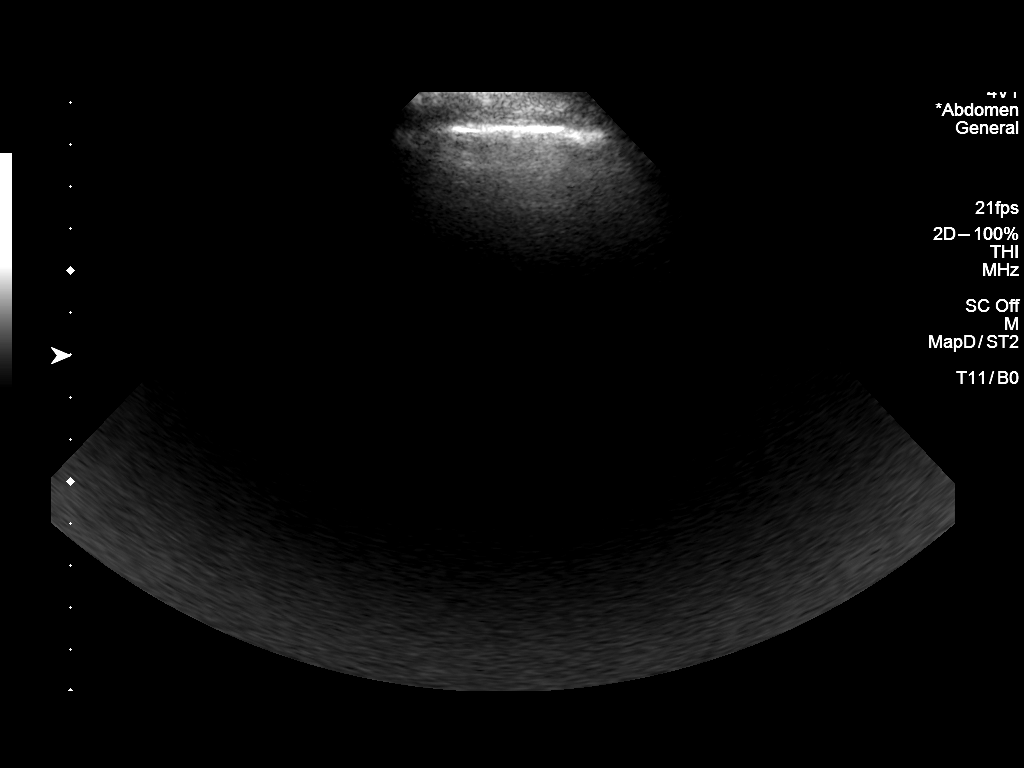

[2 of 2 positions shown; findings below may reference images not displayed]

EXAM:
ULTRASOUND GUIDED LEFT THORACENTESIS

MEDICATIONS:
None.

COMPLICATIONS:
None immediate.

PROCEDURE:
An ultrasound guided thoracentesis was thoroughly discussed with the
patient and questions answered. The benefits, risks, alternatives
and complications were also discussed. The patient understands and
wishes to proceed with the procedure. Written consent was obtained.

Ultrasound was performed to localize and mark an adequate pocket of
fluid in the left chest. The area was then prepped and draped in the
normal sterile fashion. 1% Lidocaine was used for local anesthesia.
Under ultrasound guidance a Safe-T-Centesis catheter was introduced.
Thoracentesis was performed. The catheter was removed and a dressing
applied.
FINDINGS: A total of approximately 800 cc of clear yellow fluid was removed.
Samples were sent to the laboratory as requested by the clinical
team.
IMPRESSION: Successful ultrasound guided left thoracentesis yielding 800 cc of
pleural fluid.

Post thoracentesis chest x-ray demonstrates a 20% left pneumothorax.
The appearance is most likely due to inability of the lungs to
re-expand. Evaluation of the patient reveals, that after the
thoracentesis, is symptoms are markedly improved in he feels much
better. He was instructed to return to the hospital medially if
shortness of breath develops. He will return tomorrow morning for a
follow-up chest x-ray.

## 2017-09-21 IMAGING — CR DG CHEST 1V PORT
1 series · 1 of 1 positions shown · non-contrast
Comparison: 08/29/2016

CLINICAL DATA: Post thoracentesis

EXAM:
PORTABLE CHEST 1 VIEW

[dg chest port 1 view]
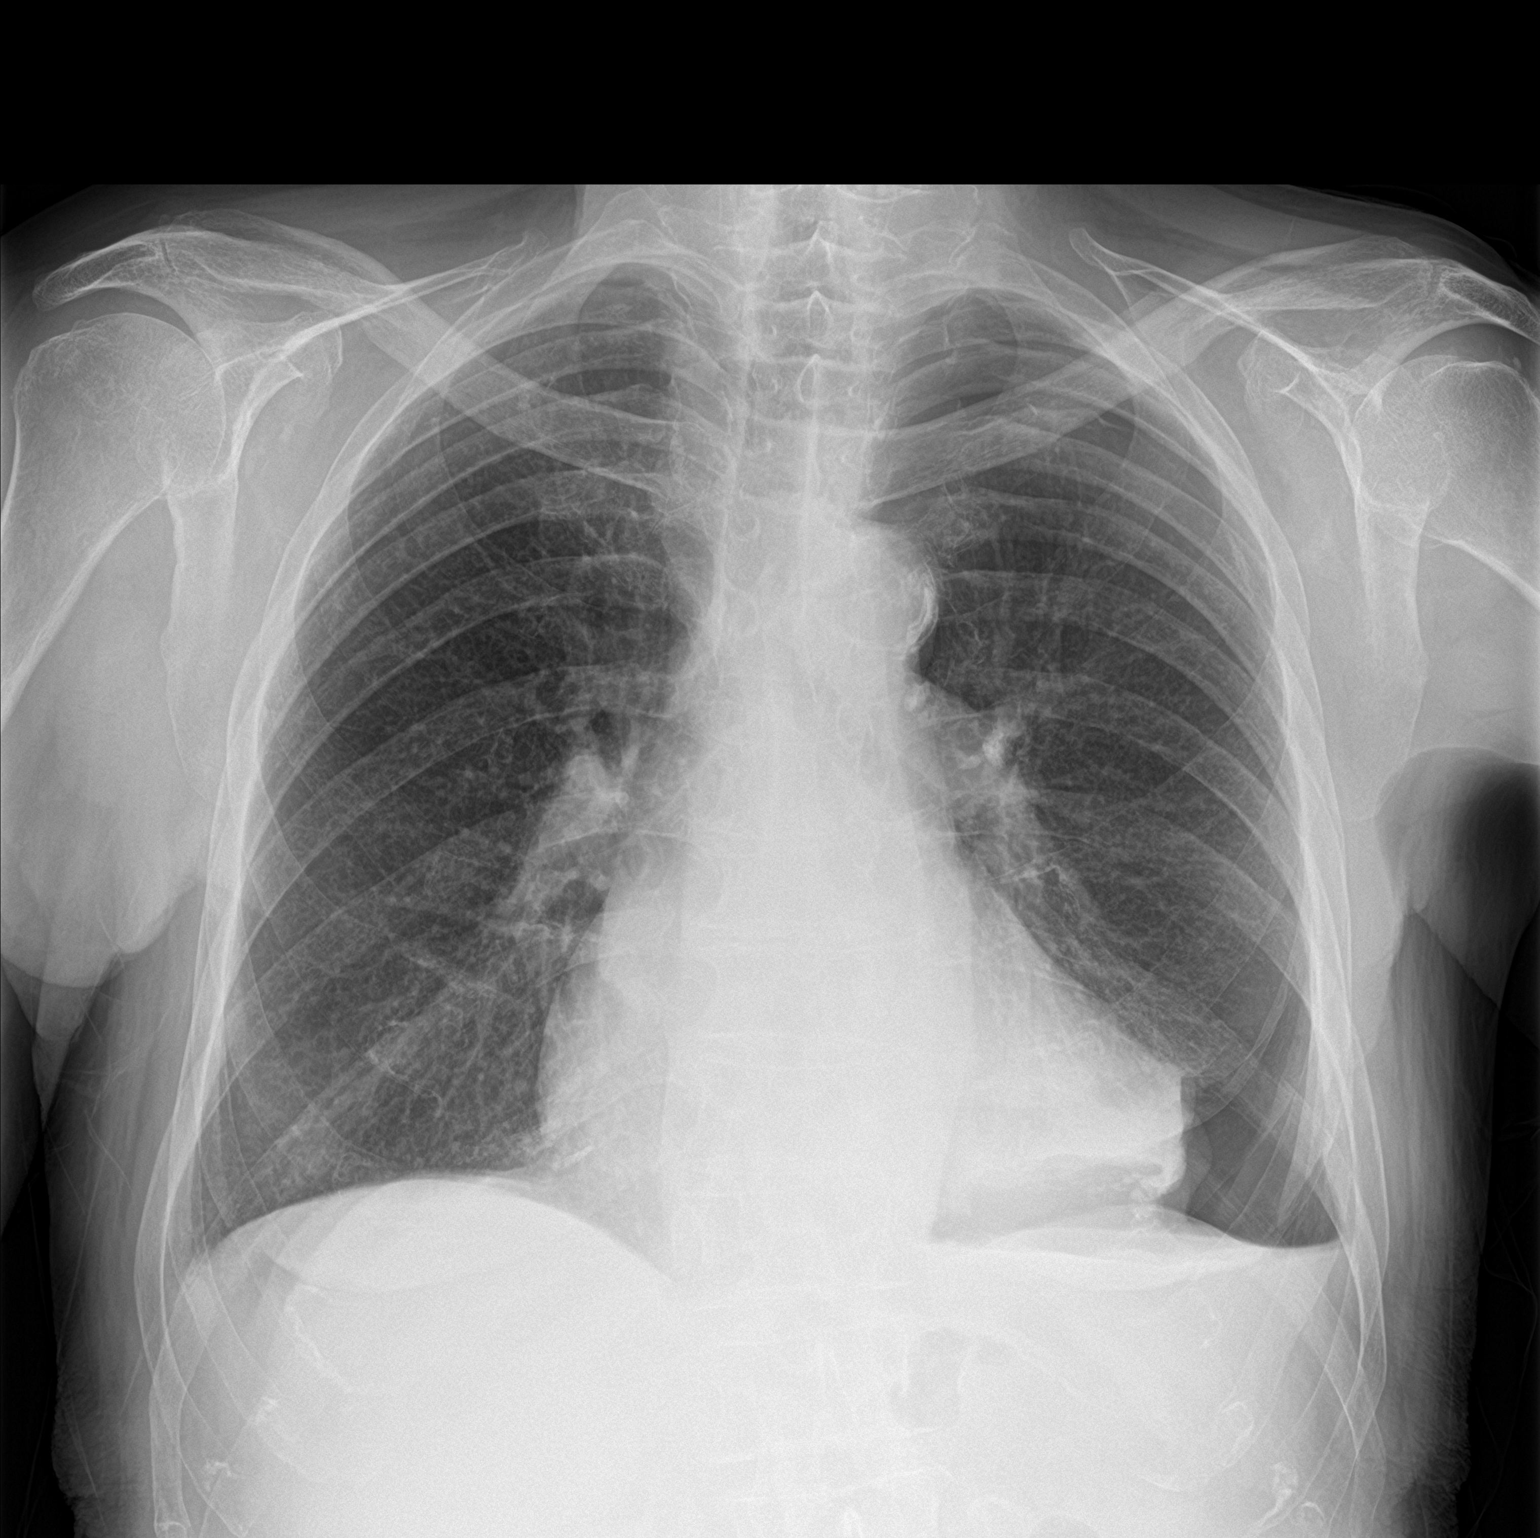

[1 of 1 positions shown; findings below may reference images not displayed]

FINDINGS: The heart remains mildly enlarged. After left thoracentesis, a left
pneumothorax has developed. It is approximately 20%. Overall lung
aeration is unchanged. There is no shift of the mediastinum. Right
lung is clear. No evidence of right pneumothorax.
IMPRESSION: 20% left pneumothorax after thoracentesis.

The patient is in no apparent distress. His dyspnea inability debris
is markedly improved after thoracentesis. He was instructed to
return if shortness of breath develops tonight. He will return
tomorrow morning for a follow-up chest x-ray.

## 2017-09-22 IMAGING — CR DG CHEST 2V
1 series · 2 of 2 positions shown · non-contrast
Comparison: 09/10/2016

CLINICAL DATA: Pleural effusion

EXAM:
CHEST  2 VIEW

[Series 1: dg chest 2 view · 0.14mm/px · 2 of 2 slices shown]
[im 1/2]
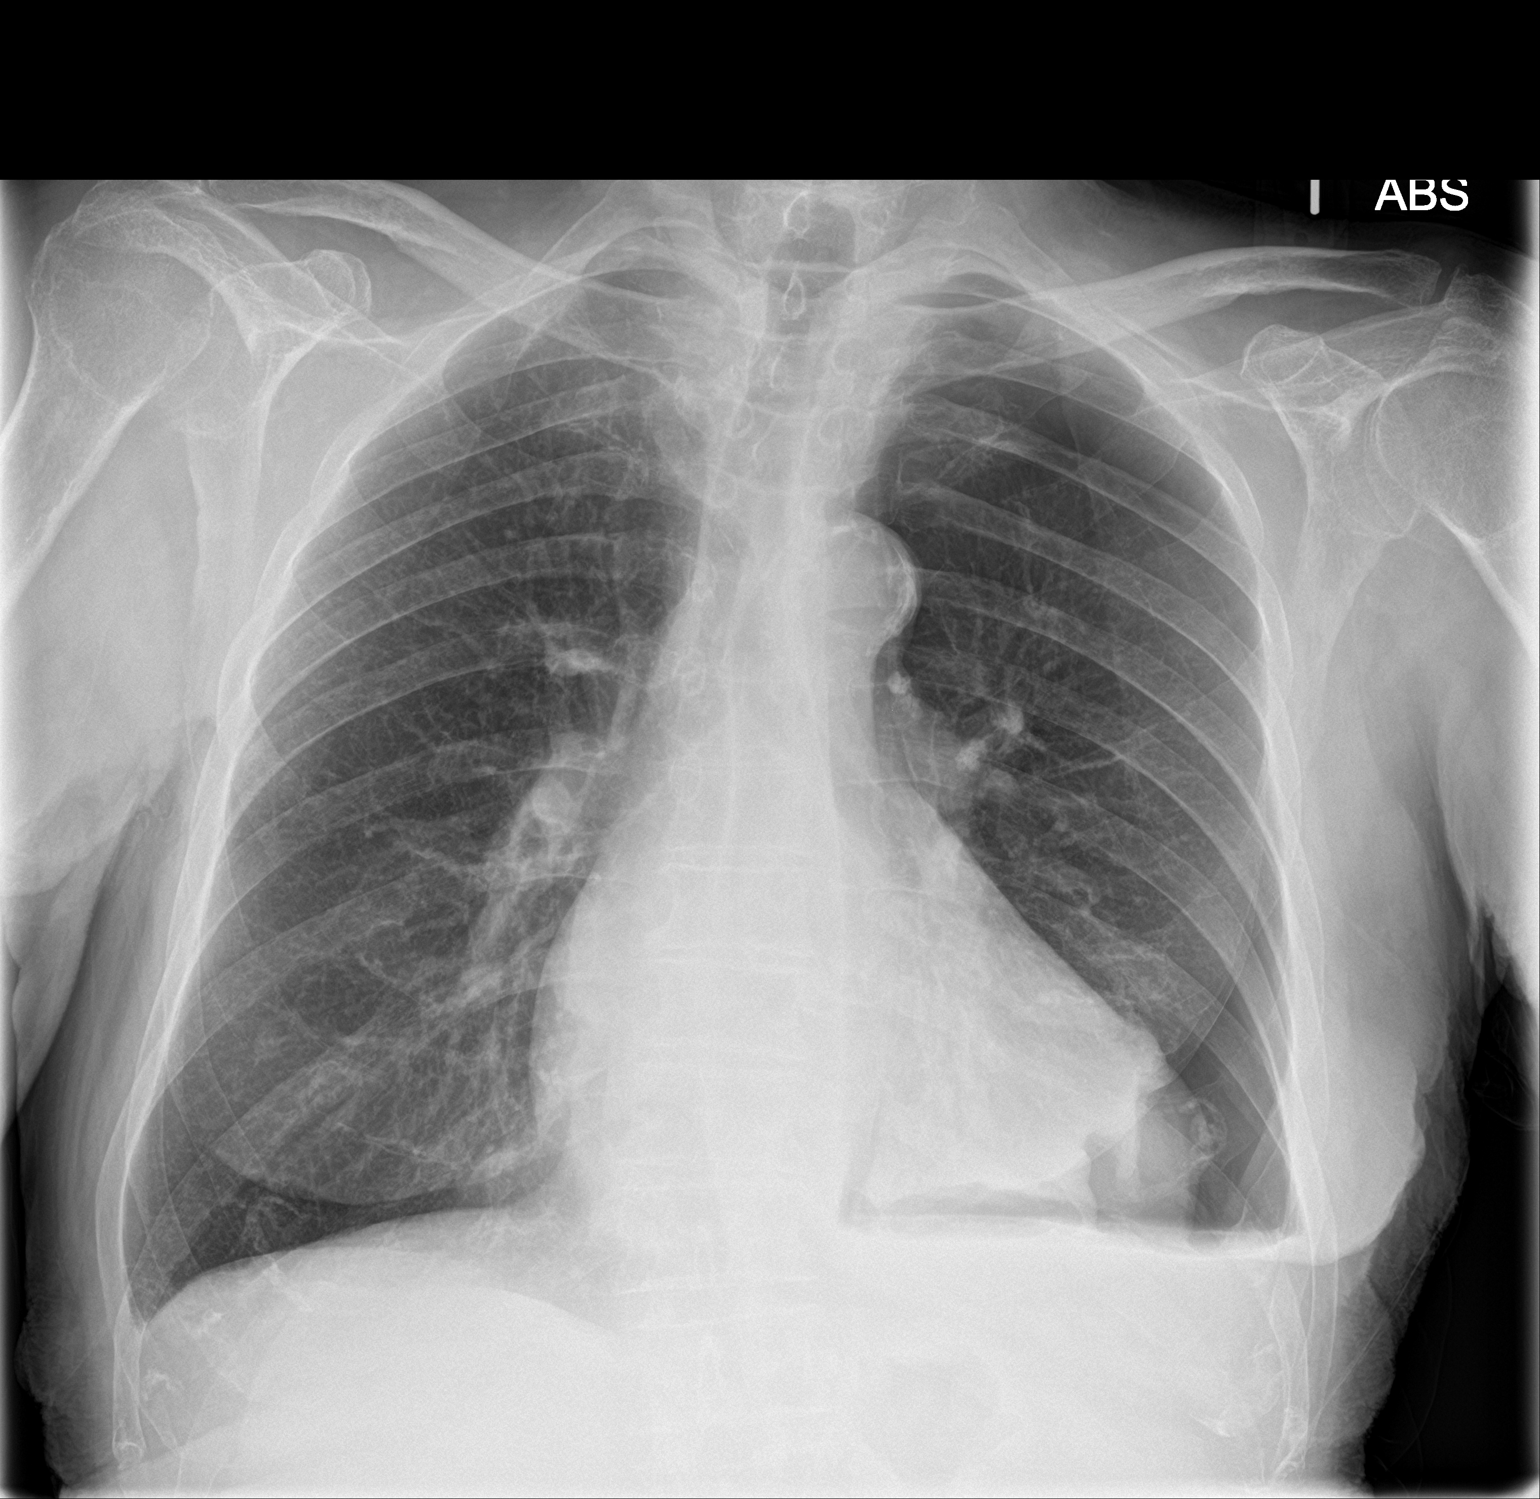
[im 2/2]
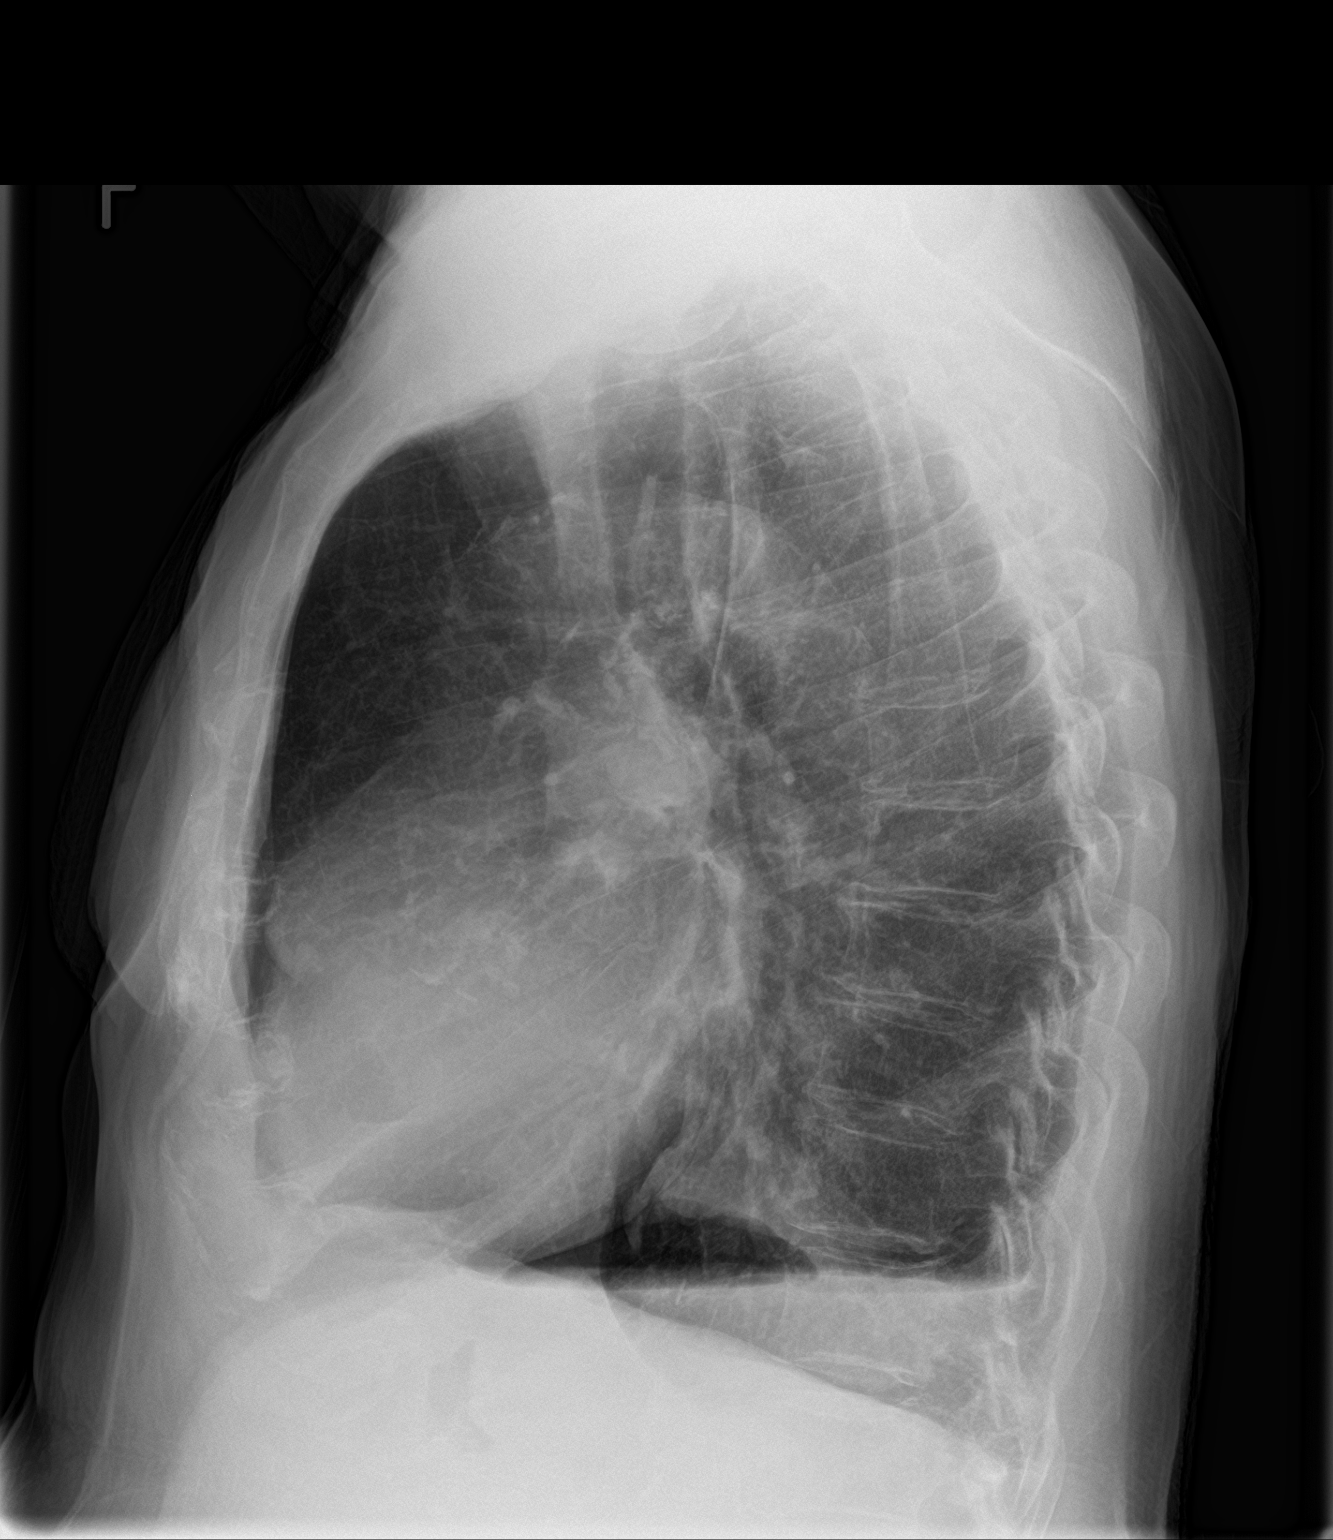

[2 of 2 positions shown; findings below may reference images not displayed]

FINDINGS: Moderate-sized left pneumothorax is again noted, approximately
20-30%, unchanged. Layering left pleural effusion. Right lung is
clear. Heart is borderline in size.
IMPRESSION: Moderate left hydropneumothorax is stable since prior study.

## 2017-10-21 IMAGING — CR DG CHEST 2V
1 series · 2 of 2 positions shown · non-contrast
Comparison: 10/02/2016

CLINICAL DATA: Shortness of breath, follow-up pleural effusion

EXAM:
CHEST  2 VIEW

[Series 1: dg chest 2 view · 0.14mm/px · 2 of 2 slices shown]
[im 1/2]
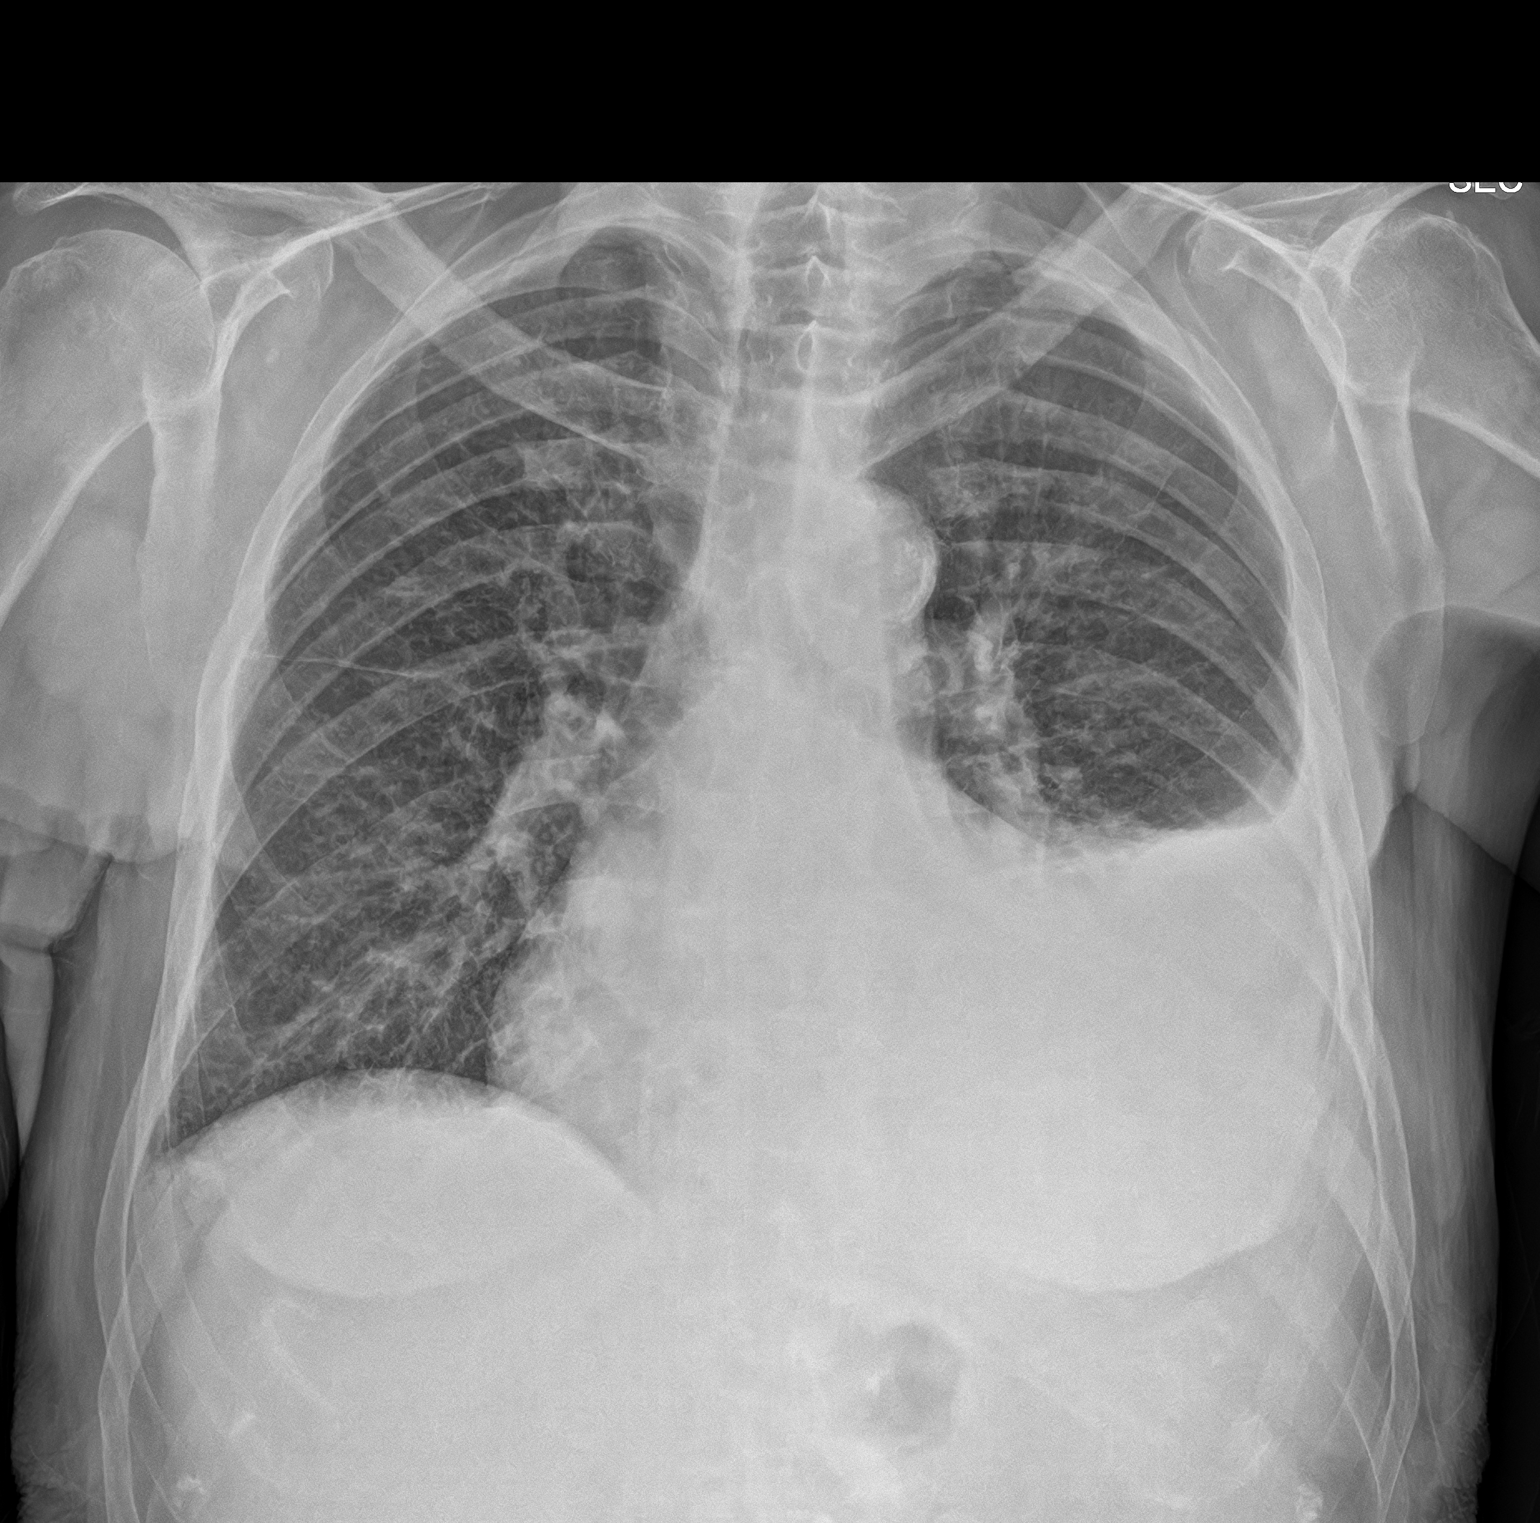
[im 2/2]
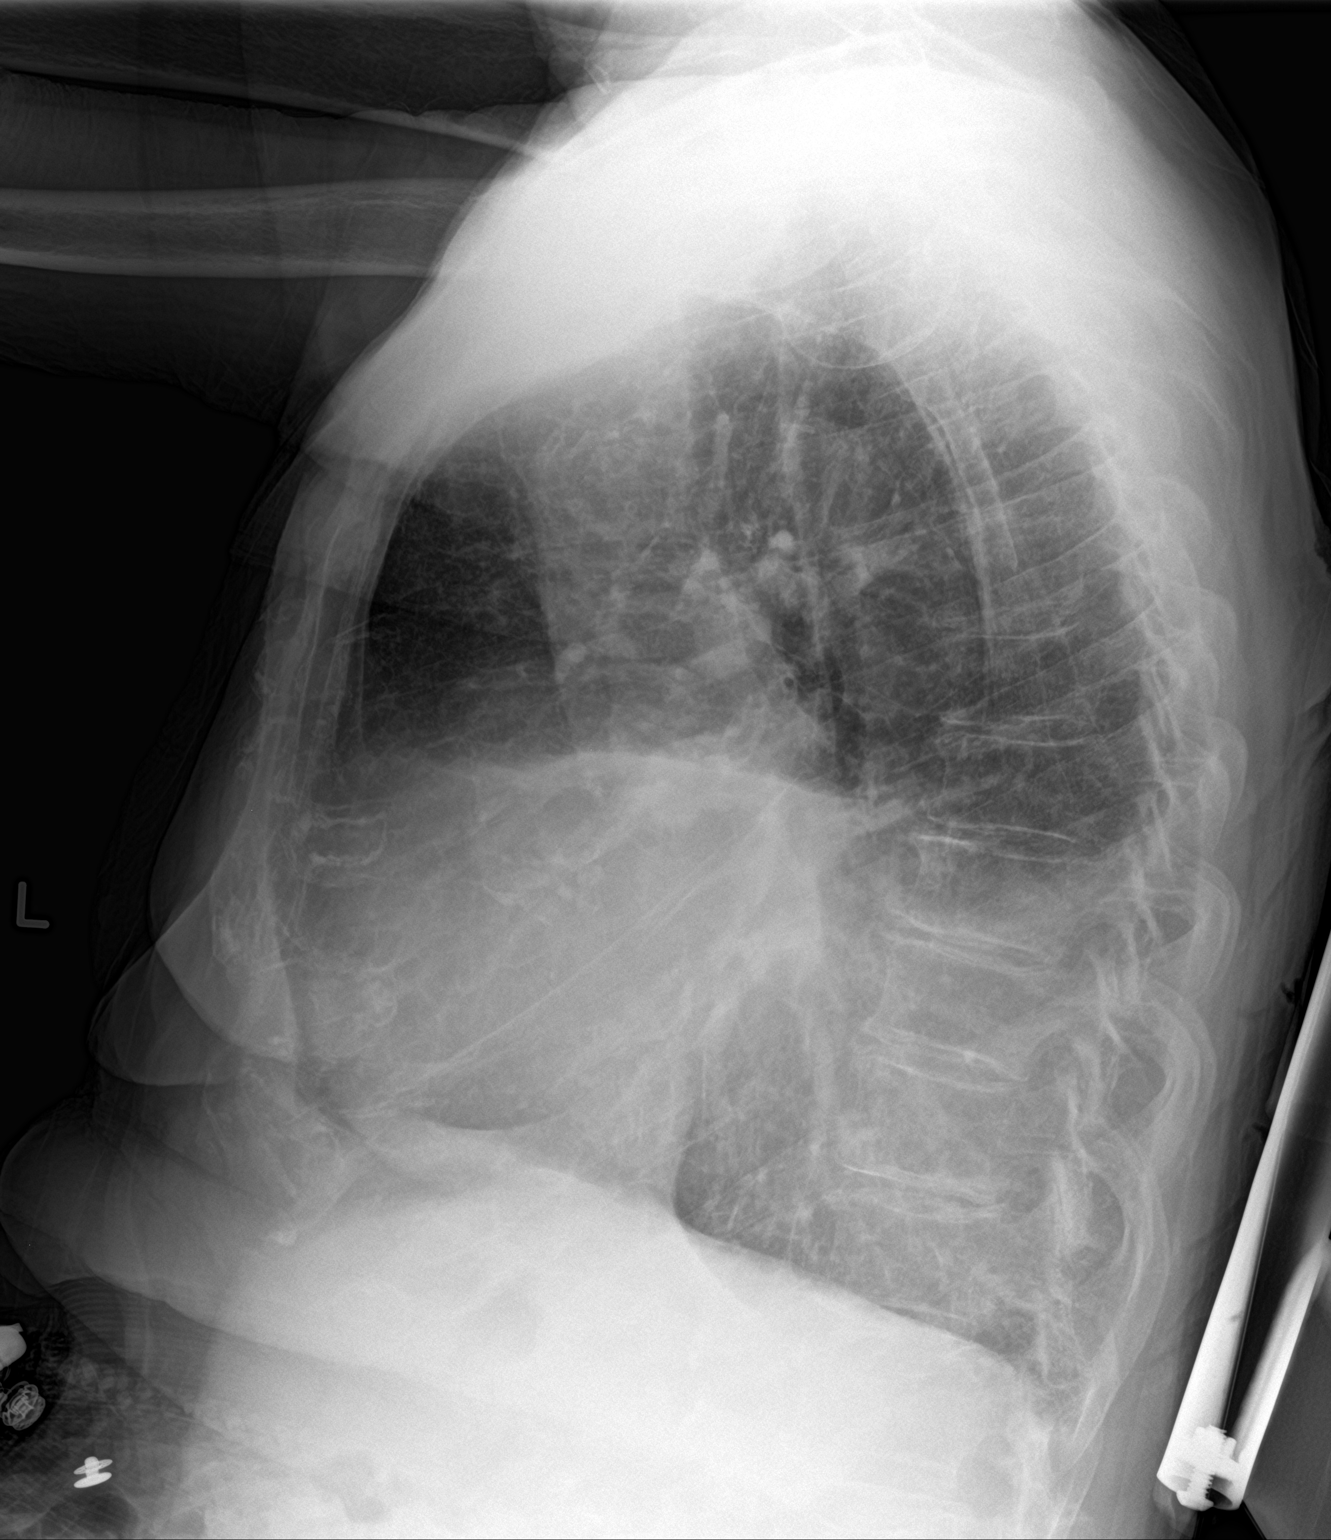

[2 of 2 positions shown; findings below may reference images not displayed]

FINDINGS: Cardiac shadow remains enlarged. Left pleural effusion is again seen
and stable. The previously noted tiny pneumothorax has resolved in
the interval. No new focal infiltrate is seen. No bony abnormality
is noted.
IMPRESSION: Stable left pleural effusion.

Resolution of small left pneumothorax.

## 2018-04-15 IMAGING — CT CT CERVICAL SPINE W/O CM
3 series · 12 of 33 positions shown, 14 images · non-contrast
Comparison: Prior CT from 12/20/2016.

CLINICAL DATA: Initial evaluation for acute trauma, fall.

EXAM:
CT HEAD WITHOUT CONTRAST
CT MAXILLOFACIAL WITHOUT CONTRAST
CT CERVICAL SPINE WITHOUT CONTRAST
TECHNIQUE: Multidetector CT imaging of the head, cervical spine, and
maxillofacial structures were performed using the standard protocol
without intravenous contrast. Multiplanar CT image reconstructions
of the cervical spine and maxillofacial structures were also
generated.

[Series 3: facialbone 2.0 st · axial · 0.39mm/px · z∈[-384,-250]mm · 4 of 99 slices shown, 5 images]
[im 16/99  soft-tissue]
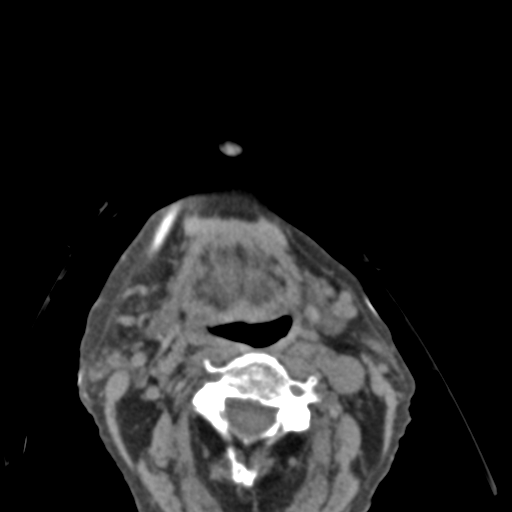
[im 16/99  bone]
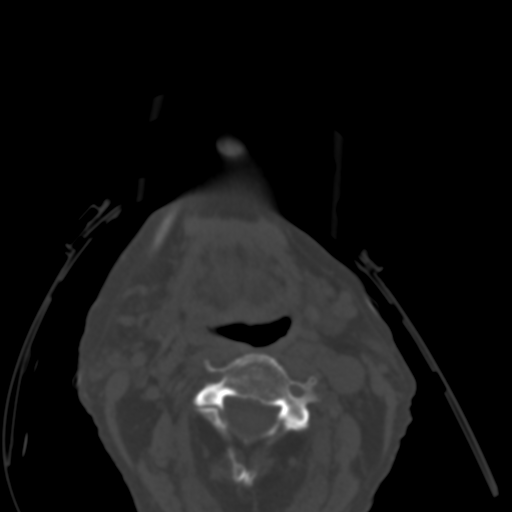
[im 38/99  bone]
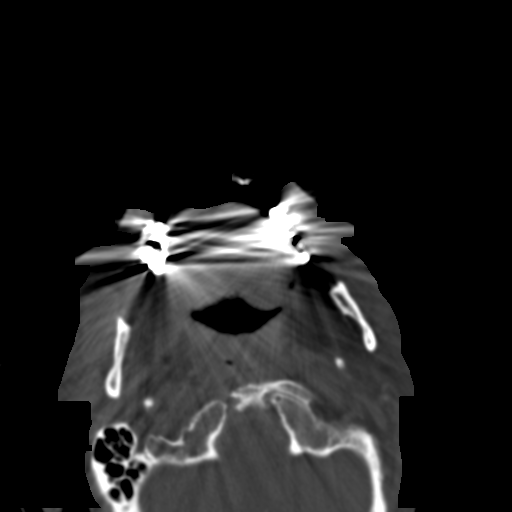
[im 61/99  bone]
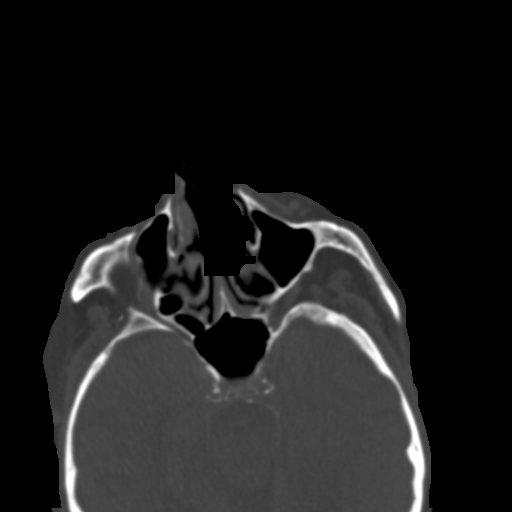
[im 83/99  bone]
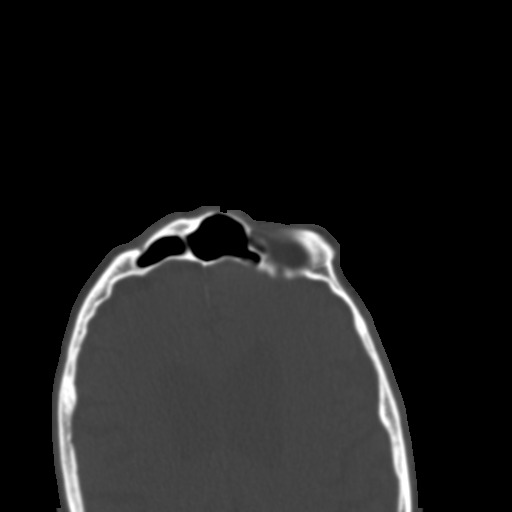

[Series 7: facialbone 2.0 cor st · coronal · 0.38mm/px · 3 of 108 slices shown]
[im 22/108  bone]
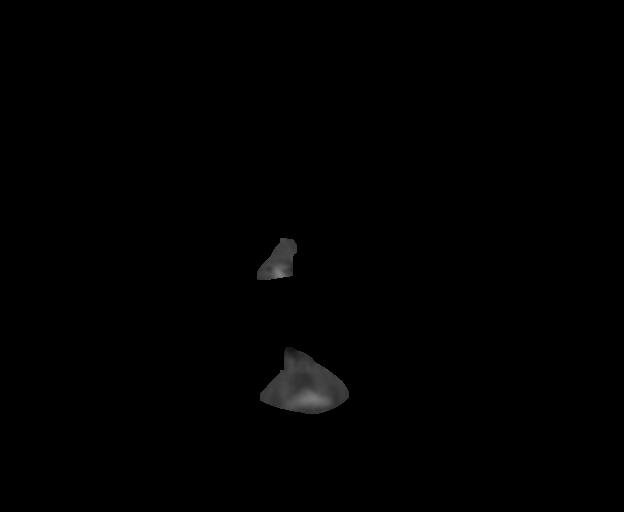
[im 43/108  bone]
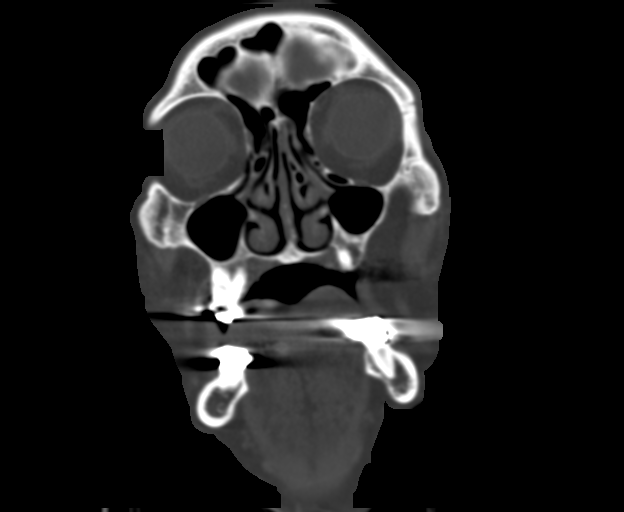
[im 65/108  bone]
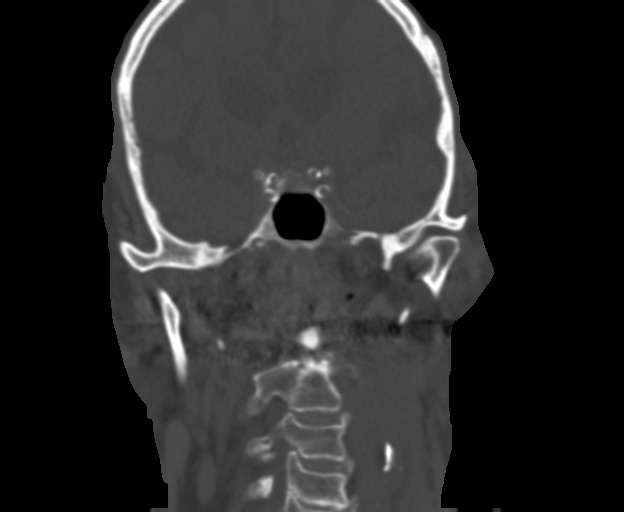

[Series 8: facialbone 2.0 sag st · sagittal · 0.38mm/px · 5 of 85 slices shown, 6 images]
[im 29/85  bone]
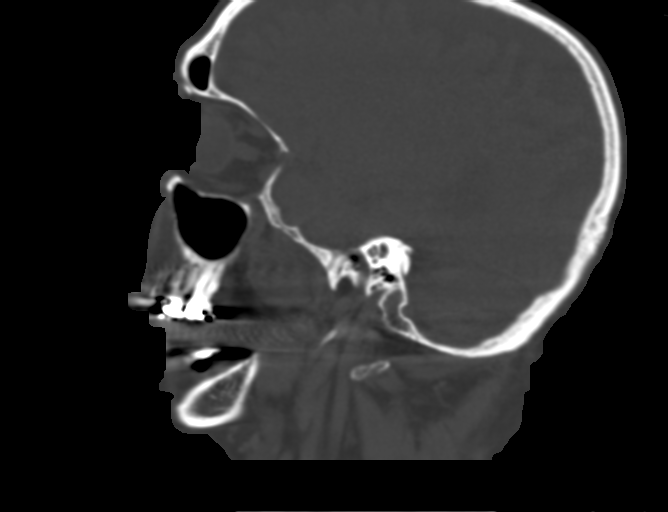
[im 36/85  bone]
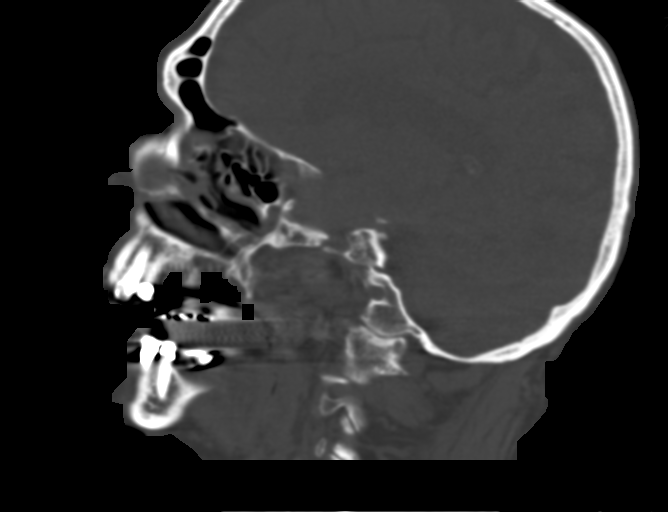
[im 43/85  soft-tissue]
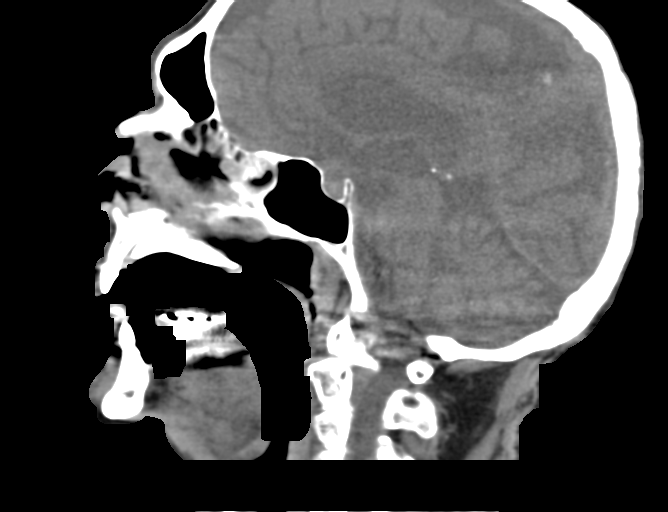
[im 43/85  bone]
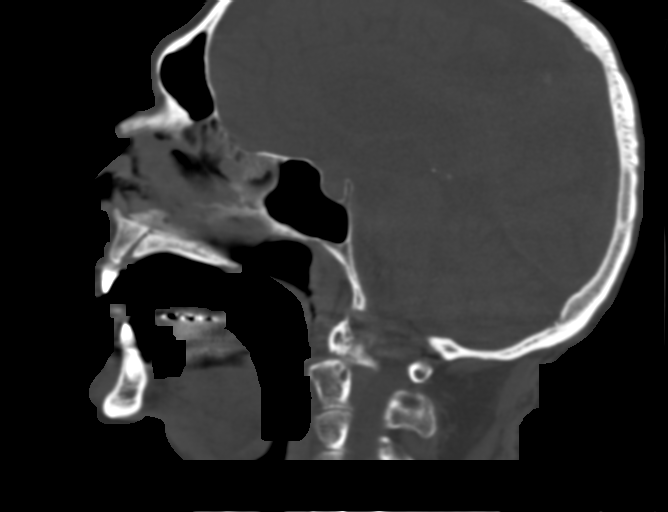
[im 50/85  bone]
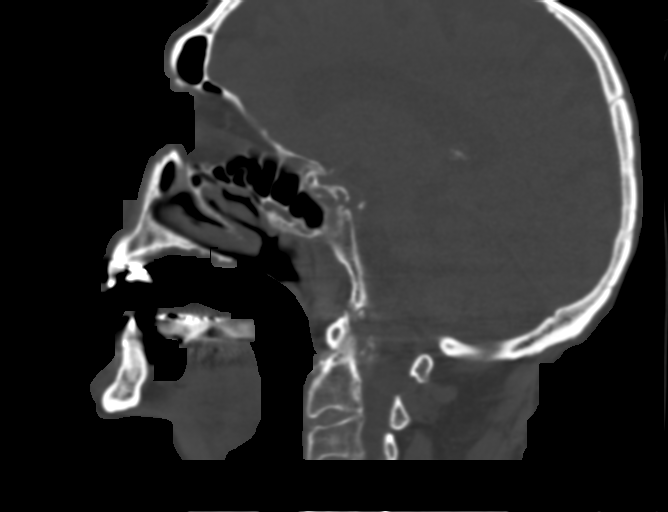
[im 57/85  bone]
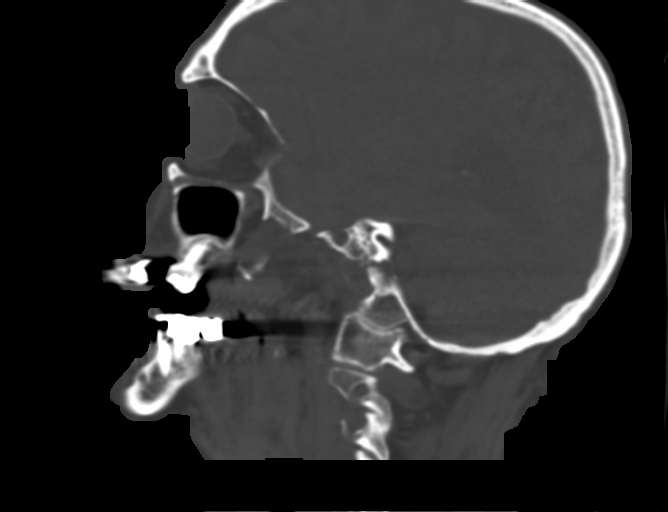

[12 of 33 positions shown; findings below may reference images not displayed]

FINDINGS: CT HEAD FINDINGS

Brain: Moderate age-related cerebral atrophy. Mild chronic small
vessel ischemic disease. No acute intracranial hemorrhage. No acute
large vessel territory infarct. No mass lesion or midline shift. No
hydrocephalus. No extra-axial fluid collection.

Vascular: No hyperdense vessel. Scattered vascular calcifications
noted within the carotid siphons.

Skull: Soft tissue contusion with laceration at the left
supraorbital region. Calvarium intact.

Other: Mastoid air cells are clear.

CT MAXILLOFACIAL FINDINGS

Osseous: Zygomatic arches intact. No acute maxillary fracture.
Pterygoid plates intact. No acute nasal bone fracture. Nasal septum
midline and intact. No acute mandibular fracture. Mandibular
condyles normally situated. No acute abnormality about the
dentition.

Orbits: Globes intact. Intraorbital soft tissues within normal
limits. Bony orbits intact.

Sinuses: Mild scattered mucosal thickening within the ethmoidal and
sphenoid sinuses. No hemosinus.

Soft tissues: Soft tissue contusion/ laceration present at the left
periorbital region.

CT CERVICAL SPINE FINDINGS

Alignment: Vertebral bodies normally aligned with preservation of
the normal cervical lordosis. Trace retrolisthesis of C4 on C5,
likely chronic.

Skull base and vertebrae: Skullbase intact. Mild rotation of C1 on
C2 likely positional. Dens intact. Vertebral body heights
maintained. No acute fracture.

Soft tissues and spinal canal: Soft tissues of the neck demonstrate
no acute abnormality. No prevertebral edema. Carotid bifurcation
atherosclerosis noted. Spinal canal within normal limits.

Disc levels: Prominent degenerative changes noted about the C1-2
articulation. Moderate degenerative spondylolysis present at C4-5
and C5-6. Multilevel facet arthrosis, greater on the right.

Upper chest: Large right with moderate left pleural effusions,
partially visualized. Interlobular septal thickening suggest
pulmonary interstitial edema. No apical pneumothorax.

Other: Subcentimeter lucency within the spinous process of C7 noted,
favored to be benign in nature.
IMPRESSION: CT HEAD:

1. No acute intracranial process.
2. Moderate cerebral atrophy with chronic small vessel ischemic
disease.

CT MAXILLOFACIAL:

1. Acute left periorbital soft tissue contusion with laceration.
2. No other acute maxillofacial injury. No acute fracture. Intact
globes with no retro-orbital pathology.

CT CERVICAL SPINE:

1. No acute traumatic injury within the cervical spine.
2. Moderate degenerative spondylolysis at C4-5 and C5-6.
3. Large right with moderate left pleural effusions with evidence of
pulmonary interstitial edema.
